# Patient Record
Sex: Female | Born: 1945
Health system: Southern US, Community
[De-identification: ages and names within clinical notes are randomized; demographics above are authoritative.]

## PROBLEM LIST (undated history)

## (undated) DIAGNOSIS — Z9889 Other specified postprocedural states: Secondary | ICD-10-CM

## (undated) DIAGNOSIS — E559 Vitamin D deficiency, unspecified: Secondary | ICD-10-CM

## (undated) DIAGNOSIS — G479 Sleep disorder, unspecified: Secondary | ICD-10-CM

## (undated) DIAGNOSIS — E039 Hypothyroidism, unspecified: Secondary | ICD-10-CM

## (undated) DIAGNOSIS — R112 Nausea with vomiting, unspecified: Secondary | ICD-10-CM

## (undated) DIAGNOSIS — K219 Gastro-esophageal reflux disease without esophagitis: Secondary | ICD-10-CM

## (undated) DIAGNOSIS — M797 Fibromyalgia: Secondary | ICD-10-CM

## (undated) DIAGNOSIS — M940 Chondrocostal junction syndrome [Tietze]: Secondary | ICD-10-CM

## (undated) DIAGNOSIS — G9001 Carotid sinus syncope: Secondary | ICD-10-CM

## (undated) DIAGNOSIS — H9190 Unspecified hearing loss, unspecified ear: Secondary | ICD-10-CM

## (undated) DIAGNOSIS — M1990 Unspecified osteoarthritis, unspecified site: Secondary | ICD-10-CM

## (undated) DIAGNOSIS — R351 Nocturia: Secondary | ICD-10-CM

## (undated) DIAGNOSIS — F411 Generalized anxiety disorder: Secondary | ICD-10-CM

## (undated) DIAGNOSIS — F419 Anxiety disorder, unspecified: Secondary | ICD-10-CM

## (undated) DIAGNOSIS — E7800 Pure hypercholesterolemia, unspecified: Secondary | ICD-10-CM

## (undated) DIAGNOSIS — M8580 Other specified disorders of bone density and structure, unspecified site: Secondary | ICD-10-CM

## (undated) DIAGNOSIS — M5430 Sciatica, unspecified side: Secondary | ICD-10-CM

## (undated) HISTORY — DX: Unspecified hearing loss, unspecified ear: H91.90

## (undated) HISTORY — DX: Fibromyalgia: M79.7

## (undated) HISTORY — DX: Sleep disorder, unspecified: G47.9

## (undated) HISTORY — DX: Nocturia: R35.1

## (undated) HISTORY — DX: Generalized anxiety disorder: F41.1

## (undated) HISTORY — DX: Unspecified osteoarthritis, unspecified site: M19.90

## (undated) HISTORY — DX: Vitamin D deficiency, unspecified: E55.9

## (undated) HISTORY — DX: Sciatica, unspecified side: M54.30

## (undated) HISTORY — PX: TUBAL LIGATION: SHX77

## (undated) HISTORY — DX: Carotid sinus syncope: G90.01

## (undated) HISTORY — PX: CHOLECYSTECTOMY: SHX55

## (undated) HISTORY — PX: KNEE SURGERY: SHX244

## (undated) HISTORY — DX: Other specified disorders of bone density and structure, unspecified site: M85.80

## (undated) HISTORY — DX: Hypothyroidism, unspecified: E03.9

## (undated) HISTORY — DX: Pure hypercholesterolemia, unspecified: E78.00

---

## 1998-11-02 ENCOUNTER — Encounter: Payer: Self-pay | Admitting: Rheumatology

## 1998-11-02 ENCOUNTER — Ambulatory Visit (HOSPITAL_COMMUNITY): Admission: RE | Admit: 1998-11-02 | Discharge: 1998-11-02 | Payer: Self-pay | Admitting: Rheumatology

## 1999-04-22 ENCOUNTER — Encounter: Payer: Self-pay | Admitting: Obstetrics and Gynecology

## 1999-04-22 ENCOUNTER — Encounter: Admission: RE | Admit: 1999-04-22 | Discharge: 1999-04-22 | Payer: Self-pay | Admitting: Obstetrics and Gynecology

## 2000-01-18 ENCOUNTER — Encounter: Payer: Self-pay | Admitting: Orthopedic Surgery

## 2000-01-18 ENCOUNTER — Ambulatory Visit (HOSPITAL_COMMUNITY): Admission: RE | Admit: 2000-01-18 | Discharge: 2000-01-18 | Payer: Self-pay | Admitting: Orthopedic Surgery

## 2000-09-20 ENCOUNTER — Encounter: Payer: Self-pay | Admitting: Obstetrics and Gynecology

## 2000-09-20 ENCOUNTER — Encounter: Admission: RE | Admit: 2000-09-20 | Discharge: 2000-09-20 | Payer: Self-pay | Admitting: Obstetrics and Gynecology

## 2001-06-29 ENCOUNTER — Other Ambulatory Visit: Admission: RE | Admit: 2001-06-29 | Discharge: 2001-06-29 | Payer: Self-pay | Admitting: Obstetrics and Gynecology

## 2002-02-26 ENCOUNTER — Ambulatory Visit (HOSPITAL_COMMUNITY): Admission: RE | Admit: 2002-02-26 | Discharge: 2002-02-26 | Payer: Self-pay | Admitting: Gastroenterology

## 2002-03-11 ENCOUNTER — Encounter: Admission: RE | Admit: 2002-03-11 | Discharge: 2002-03-11 | Payer: Self-pay | Admitting: Family Medicine

## 2002-03-11 ENCOUNTER — Encounter: Payer: Self-pay | Admitting: Family Medicine

## 2002-07-06 ENCOUNTER — Emergency Department (HOSPITAL_COMMUNITY): Admission: EM | Admit: 2002-07-06 | Discharge: 2002-07-06 | Payer: Self-pay | Admitting: Emergency Medicine

## 2002-12-06 ENCOUNTER — Other Ambulatory Visit: Admission: RE | Admit: 2002-12-06 | Discharge: 2002-12-06 | Payer: Self-pay | Admitting: Obstetrics and Gynecology

## 2003-02-22 HISTORY — PX: CHOLECYSTECTOMY: SHX55

## 2003-12-24 ENCOUNTER — Other Ambulatory Visit: Admission: RE | Admit: 2003-12-24 | Discharge: 2003-12-24 | Payer: Self-pay | Admitting: Obstetrics and Gynecology

## 2004-03-10 ENCOUNTER — Encounter: Admission: RE | Admit: 2004-03-10 | Discharge: 2004-03-10 | Payer: Self-pay | Admitting: Obstetrics and Gynecology

## 2004-05-31 ENCOUNTER — Ambulatory Visit (HOSPITAL_COMMUNITY): Admission: RE | Admit: 2004-05-31 | Discharge: 2004-05-31 | Payer: Self-pay | Admitting: Gastroenterology

## 2004-05-31 ENCOUNTER — Encounter (INDEPENDENT_AMBULATORY_CARE_PROVIDER_SITE_OTHER): Payer: Self-pay | Admitting: *Deleted

## 2004-06-03 ENCOUNTER — Encounter: Admission: RE | Admit: 2004-06-03 | Discharge: 2004-06-03 | Payer: Self-pay | Admitting: Gastroenterology

## 2004-06-15 ENCOUNTER — Ambulatory Visit (HOSPITAL_COMMUNITY): Admission: RE | Admit: 2004-06-15 | Discharge: 2004-06-15 | Payer: Self-pay | Admitting: General Surgery

## 2004-09-23 ENCOUNTER — Encounter (INDEPENDENT_AMBULATORY_CARE_PROVIDER_SITE_OTHER): Payer: Self-pay | Admitting: Specialist

## 2004-09-23 ENCOUNTER — Ambulatory Visit (HOSPITAL_COMMUNITY): Admission: RE | Admit: 2004-09-23 | Discharge: 2004-09-24 | Payer: Self-pay | Admitting: General Surgery

## 2004-11-16 ENCOUNTER — Encounter: Admission: RE | Admit: 2004-11-16 | Discharge: 2004-11-16 | Payer: Self-pay | Admitting: Gastroenterology

## 2004-12-21 ENCOUNTER — Encounter: Admission: RE | Admit: 2004-12-21 | Discharge: 2004-12-21 | Payer: Self-pay | Admitting: Gastroenterology

## 2004-12-24 ENCOUNTER — Other Ambulatory Visit: Admission: RE | Admit: 2004-12-24 | Discharge: 2004-12-24 | Payer: Self-pay | Admitting: Obstetrics and Gynecology

## 2005-07-12 ENCOUNTER — Encounter: Admission: RE | Admit: 2005-07-12 | Discharge: 2005-07-12 | Payer: Self-pay | Admitting: Obstetrics and Gynecology

## 2005-12-26 ENCOUNTER — Other Ambulatory Visit: Admission: RE | Admit: 2005-12-26 | Discharge: 2005-12-26 | Payer: Self-pay | Admitting: Obstetrics and Gynecology

## 2006-02-21 HISTORY — PX: ESOPHAGUS SURGERY: SHX626

## 2006-09-25 ENCOUNTER — Ambulatory Visit (HOSPITAL_COMMUNITY): Admission: RE | Admit: 2006-09-25 | Discharge: 2006-09-25 | Payer: Self-pay | Admitting: Obstetrics and Gynecology

## 2007-02-27 ENCOUNTER — Other Ambulatory Visit: Admission: RE | Admit: 2007-02-27 | Discharge: 2007-02-27 | Payer: Self-pay | Admitting: Obstetrics and Gynecology

## 2007-10-24 ENCOUNTER — Ambulatory Visit (HOSPITAL_COMMUNITY): Admission: RE | Admit: 2007-10-24 | Discharge: 2007-10-24 | Payer: Self-pay | Admitting: Obstetrics and Gynecology

## 2008-04-30 ENCOUNTER — Other Ambulatory Visit: Admission: RE | Admit: 2008-04-30 | Discharge: 2008-04-30 | Payer: Self-pay | Admitting: Obstetrics and Gynecology

## 2008-04-30 ENCOUNTER — Encounter: Payer: Self-pay | Admitting: Obstetrics and Gynecology

## 2008-04-30 ENCOUNTER — Ambulatory Visit: Payer: Self-pay | Admitting: Obstetrics and Gynecology

## 2008-06-23 ENCOUNTER — Ambulatory Visit: Payer: Self-pay | Admitting: Obstetrics and Gynecology

## 2008-10-09 ENCOUNTER — Ambulatory Visit: Payer: Self-pay | Admitting: Obstetrics and Gynecology

## 2008-12-31 ENCOUNTER — Ambulatory Visit (HOSPITAL_COMMUNITY): Admission: RE | Admit: 2008-12-31 | Discharge: 2008-12-31 | Payer: Self-pay | Admitting: Obstetrics and Gynecology

## 2009-05-05 ENCOUNTER — Ambulatory Visit: Payer: Self-pay | Admitting: Obstetrics and Gynecology

## 2009-05-05 ENCOUNTER — Other Ambulatory Visit: Admission: RE | Admit: 2009-05-05 | Discharge: 2009-05-05 | Payer: Self-pay | Admitting: Obstetrics and Gynecology

## 2010-02-02 ENCOUNTER — Ambulatory Visit: Payer: Self-pay | Admitting: Family Medicine

## 2010-02-08 ENCOUNTER — Ambulatory Visit: Payer: Self-pay | Admitting: Gynecology

## 2010-02-17 ENCOUNTER — Ambulatory Visit
Admission: RE | Admit: 2010-02-17 | Discharge: 2010-02-17 | Payer: Self-pay | Source: Home / Self Care | Attending: Gynecology | Admitting: Gynecology

## 2010-03-02 ENCOUNTER — Emergency Department (HOSPITAL_COMMUNITY)
Admission: EM | Admit: 2010-03-02 | Discharge: 2010-03-03 | Payer: Self-pay | Source: Home / Self Care | Admitting: Emergency Medicine

## 2010-03-03 ENCOUNTER — Ambulatory Visit
Admission: RE | Admit: 2010-03-03 | Discharge: 2010-03-03 | Payer: Self-pay | Source: Home / Self Care | Attending: Obstetrics and Gynecology | Admitting: Obstetrics and Gynecology

## 2010-03-08 LAB — URINALYSIS, ROUTINE W REFLEX MICROSCOPIC
Bilirubin Urine: NEGATIVE
Hgb urine dipstick: NEGATIVE
Ketones, ur: 15 mg/dL — AB
Nitrite: NEGATIVE
Protein, ur: NEGATIVE mg/dL
Specific Gravity, Urine: 1.013 (ref 1.005–1.030)
Urine Glucose, Fasting: NEGATIVE mg/dL
Urobilinogen, UA: 0.2 mg/dL (ref 0.0–1.0)
pH: 7.5 (ref 5.0–8.0)

## 2010-03-08 LAB — COMPREHENSIVE METABOLIC PANEL
ALT: 17 U/L (ref 0–35)
AST: 26 U/L (ref 0–37)
Albumin: 4.1 g/dL (ref 3.5–5.2)
Alkaline Phosphatase: 75 U/L (ref 39–117)
BUN: 17 mg/dL (ref 6–23)
CO2: 23 mEq/L (ref 19–32)
Calcium: 9.7 mg/dL (ref 8.4–10.5)
Chloride: 106 mEq/L (ref 96–112)
Creatinine, Ser: 0.82 mg/dL (ref 0.4–1.2)
GFR calc Af Amer: >60 mL/min (ref >60)
GFR calc non Af Amer: >60 mL/min (ref >60)
Glucose, Bld: 152 mg/dL — ABNORMAL HIGH (ref 70–99)
Potassium: 3.7 mEq/L (ref 3.5–5.1)
Sodium: 140 mEq/L (ref 135–145)
Total Bilirubin: 0.7 mg/dL (ref 0.3–1.2)
Total Protein: 6.9 g/dL (ref 6.0–8.3)

## 2010-03-08 LAB — CBC
HCT: 38.8 % (ref 36.0–46.0)
Hemoglobin: 13.3 g/dL (ref 12.0–15.0)
MCH: 32.1 pg (ref 26.0–34.0)
MCHC: 34.3 g/dL (ref 30.0–36.0)
MCV: 93.7 fL (ref 78.0–100.0)
RBC: 4.14 MIL/uL (ref 3.87–5.11)
RDW: 13.9 % (ref 11.5–15.5)
WBC: 9.3 10*3/uL (ref 4.0–10.5)

## 2010-03-08 LAB — LIPASE, BLOOD: Lipase: 25 U/L (ref 11–59)

## 2010-03-08 LAB — URINE MICROSCOPIC-ADD ON

## 2010-03-08 LAB — DIFFERENTIAL
Basophils Absolute: 0 10*3/uL (ref 0.0–0.1)
Basophils Relative: 0 % (ref 0–1)
Eosinophils Absolute: 0.1 10*3/uL (ref 0.0–0.7)
Eosinophils Relative: 1 % (ref 0–5)
Lymphocytes Relative: 14 % (ref 12–46)
Lymphs Abs: 1.3 10*3/uL (ref 0.7–4.0)
Monocytes Absolute: 0.7 10*3/uL (ref 0.1–1.0)
Monocytes Relative: 7 % (ref 3–12)
Neutro Abs: 7.2 10*3/uL (ref 1.7–7.7)
Neutrophils Relative %: 78 % — ABNORMAL HIGH (ref 43–77)

## 2010-03-13 ENCOUNTER — Encounter: Payer: Self-pay | Admitting: Obstetrics and Gynecology

## 2010-03-14 ENCOUNTER — Encounter: Payer: Self-pay | Admitting: Obstetrics and Gynecology

## 2010-03-15 ENCOUNTER — Encounter: Payer: Self-pay | Admitting: Obstetrics and Gynecology

## 2010-03-30 ENCOUNTER — Other Ambulatory Visit: Payer: Self-pay | Admitting: Obstetrics and Gynecology

## 2010-03-30 DIAGNOSIS — Z1231 Encounter for screening mammogram for malignant neoplasm of breast: Secondary | ICD-10-CM

## 2010-04-13 ENCOUNTER — Ambulatory Visit: Payer: Self-pay

## 2010-04-21 ENCOUNTER — Ambulatory Visit: Payer: Self-pay

## 2010-04-29 ENCOUNTER — Ambulatory Visit
Admission: RE | Admit: 2010-04-29 | Discharge: 2010-04-29 | Disposition: A | Discharge status: Home / Self Care | Payer: BC Managed Care – PPO | Source: Ambulatory Visit | Attending: Obstetrics and Gynecology | Admitting: Obstetrics and Gynecology

## 2010-04-29 DIAGNOSIS — Z1231 Encounter for screening mammogram for malignant neoplasm of breast: Secondary | ICD-10-CM

## 2010-05-17 ENCOUNTER — Encounter: Payer: Self-pay | Admitting: Obstetrics and Gynecology

## 2010-05-21 ENCOUNTER — Other Ambulatory Visit (HOSPITAL_COMMUNITY)
Admission: RE | Admit: 2010-05-21 | Discharge: 2010-05-21 | Disposition: A | Discharge status: Home / Self Care | Payer: BC Managed Care – PPO | Source: Ambulatory Visit | Attending: Obstetrics and Gynecology | Admitting: Obstetrics and Gynecology

## 2010-05-21 ENCOUNTER — Other Ambulatory Visit: Payer: Self-pay | Admitting: Obstetrics and Gynecology

## 2010-05-21 ENCOUNTER — Encounter (INDEPENDENT_AMBULATORY_CARE_PROVIDER_SITE_OTHER): Payer: BC Managed Care – PPO | Admitting: Obstetrics and Gynecology

## 2010-05-21 DIAGNOSIS — Z833 Family history of diabetes mellitus: Secondary | ICD-10-CM

## 2010-05-21 DIAGNOSIS — Z124 Encounter for screening for malignant neoplasm of cervix: Secondary | ICD-10-CM | POA: Insufficient documentation

## 2010-05-21 DIAGNOSIS — Z01419 Encounter for gynecological examination (general) (routine) without abnormal findings: Secondary | ICD-10-CM

## 2010-05-21 DIAGNOSIS — R635 Abnormal weight gain: Secondary | ICD-10-CM

## 2010-05-21 DIAGNOSIS — Z13220 Encounter for screening for lipoid disorders: Secondary | ICD-10-CM

## 2010-05-21 DIAGNOSIS — R82998 Other abnormal findings in urine: Secondary | ICD-10-CM

## 2010-07-09 NOTE — Op Note (Signed)
   Pam [name], Pam [name]                           ACCOUNT NO.:  [id number]   MEDICAL RECORD NO.:  [id number]                   PATIENT TYPE:  AMB   LOCATION:  ENDO                                 FACILITY:  [facility]   PHYSICIAN:  Anselmo Rod, M.D.               DATE OF BIRTH:  Jan 02, 1946   DATE OF PROCEDURE:  02/26/2002  DATE OF DISCHARGE:                                 OPERATIVE REPORT   PROCEDURE:  Colonoscopy.   ENDOSCOPIST:  Anselmo Rod, M.D.   INSTRUMENT USED:  Pediatric adjustable Olympus colonoscope.   INDICATIONS FOR PROCEDURE:  A 65 year old [place] female undergoing  screening colonoscopy to rule out chronic polyps, masses, etc.  The patient  has a history of diarrhea alternating with constipation.   PROCEDURE PREPARATION:  Informed consent was procured from the patient. The  patient was fasted with a bottle of Gatorade and MiraLax the night prior to  the procedure.   PREPROCEDURE PHYSICAL:  VITAL SIGNS:  Stable.  NECK: Supple. CHEST: Clear to  auscultation. HEART: S1 and S2 regular. ABDOMEN: Soft with normal bowel  sounds.   DESCRIPTION OF PROCEDURE:  The patient was placed in the left lateral  decubitus position and sedated with 60 mg of Demerol and 7 mg of Versed  intravenously. Once the patient was adequately sedated and maintained on low  flow oxygen and continuous cardiac monitoring, the Olympus video colonoscope  was advanced from the rectum to the cecum and terminal ileum. There was some  residual stool in the colon. Multiple washings were done. Very small lesions  could have been missed, however, no large masses, polyps, erosions,  ulcerations, or diverticula were seen. The patient tolerated the procedure  well without complications.   IMPRESSION:  1. Essentially normal colonoscopy up to the terminal ileum.  2. Some residual stool and very small lesions could have been missed.   RECOMMENDATIONS:  A high fiber diet is recommended for the patient  and  patient follow-up is advised in the next two weeks or earlier if needed.  Liberal fluid supplementation along with fiber has been advised.  Further  recommendations made as needed.                                               Anselmo Rod, M.D.    JNM/MEDQ  D:  02/27/2002  T:  02/27/2002  Job:  [id number]

## 2010-07-09 NOTE — Op Note (Signed)
[name], [name]               ACCOUNT NO.:  [id number]   MEDICAL RECORD NO.:  [id number]          PATIENT TYPE:  AMB   LOCATION:  ENDO                         FACILITY:  [facility]   PHYSICIAN:  Anselmo Rod, M.D.  DATE OF BIRTH:  12-Oct-1945   DATE OF PROCEDURE:  05/31/2004  DATE OF DISCHARGE:                                 OPERATIVE REPORT   PROCEDURE PERFORMED:  Esophagogastroduodenoscopy with biopsies.   ENDOSCOPIST:  Anselmo Rod, M.D.   INSTRUMENT USED:  Olympus video panendoscope.   INDICATIONS FOR PROCEDURE:  A 65 year old [place] female with history of  severe epigastric pain and retrosternal burning and hoarsenees inspite of  the use of PPI's rule out peptic ulcer disease, esophagitis, gastritis, etc.   PREPROCEDURE PREPARATION:  Informed consent was procured from the patient.  The patient fasted for eight hours prior to the procedure.   PREPROCEDURE PHYSICAL EXAMINATION:  VITAL SIGNS:  Stable.  NECK:  Supple.  CHEST:  Clear to auscultation.  CARDIOVASCULAR:  S1 and S2 regular.  ABDOMEN:  Soft with normal bowel sounds.   DESCRIPTION OF PROCEDURE:  The patient was placed in left lateral decubitus  position, sedated with 60 mg of Demerol and 8  mg of Versed in slow  incremental doses.  Once the patient was adequately sedated and maintained  on low flow oxygen and continuous cardiac monitoring, the Olympus video  panendoscope was advanced through the mouthpiece over the tongue and into  the esophagus under direct vision.  The entire esophagus appeared normal and  widely patent with no evidence of ring, stricture, masses, esophagitis or  Barrett's mucosa.  The scope was then advanced in the stomach.  A small  hiatal hernia was seen on high retroflexion.  Antral gastritis was noted.  Biopsies were done to rule out presence of H. pylori by pathology.  The  proximal small-bowel appeared normal.  No ulcers, erosions, masses or polyps  were identified.  The patient  tolerated the procedure well without  complications.   IMPRESSION:  1.  Normal esophagus and proximal small-bowel.  2.  Small hiatal hernia.  3.  Antral gastritis, biopsies done to rule out Helicobacter pylori.   RECOMMENDATIONS:  1.  Proceed with abdominal ultrasound and HIDA scan with CCK injection.  2.  A 24-hour repeat study will be done if the above mentioned tests are      unrevealing.  3.  Continue PPIs for now and avoid nonsteroidals.  4.  Outpatient follow-up in the next two to three weeks, earlier if need be.      JNM/MEDQ  D:  05/31/2004  T:  05/31/2004  Job:  [id number]   cc:   Pablo Ledger, MD  [facility], [place]

## 2010-07-09 NOTE — Op Note (Signed)
NAMEALECE, KOPPEL NO.:  [id number]   MEDICAL RECORD NO.:  [id number]          PATIENT TYPE:  OIB   LOCATION:  2899                         FACILITY:  [facility]   PHYSICIAN:  Sharlet Salina T. Hoxworth, M.D.DATE OF BIRTH:  30-Oct-1945   DATE OF PROCEDURE:  09/23/2004  DATE OF DISCHARGE:                                 OPERATIVE REPORT   PREOPERATIVE DIAGNOSIS:  Biliary dyskinesia.   POSTOPERATIVE DIAGNOSIS:  Biliary dyskinesia.   SURGICAL PROCEDURE:  Laparoscopic cholecystectomy with intraoperative  cholangiogram.   SURGEON:  Sharlet Salina T. Hoxworth, M.D.   ASSISTANT:  Consuello Bossier, M.D.   BRIEF HISTORY:  Ms. Coventry is a 65 year old female with a long history of  gradually worsening episodes of upper abdominal pain, nausea and vomiting.  She has had a thorough workup including a negative gallbladder ultrasound,  upper endoscopy and a HIDA scan showing emptying of the gallbladder at lower  limits of normal.  Her symptoms have been progressive and worsening with  upper abdominal and right upper quadrant tenderness, and clinically she is  felt to likely have biliary dyskinesia.  Due to unrelenting symptoms, we  have elected to proceed with laparoscopic cholecystectomy with cholangiogram  in an effort to relieve her symptoms.  The nature of the procedure,  indications and risks of bleeding, infection, bile leak, bile duct injury  and possible failure to relieve her symptoms were discussed and understood.  She is now brought to the operating room for this procedure.   DESCRIPTION OF OPERATION:  The patient was brought to the operating room and  placed in supine position on the operating table and general endotracheal  anesthesia was induced.  The abdomen was sterilely prepped and draped.  Correct patient and procedure were verified.  Local anesthesia was used to  infiltrate the trocar sites prior to the incisions.  The Hasson trocar was  used through a 1-cm umbilical  incision and a mattress suture of 0 Vicryl and  pneumoperitoneum established.  Standard 4-port technique was used.  The  gallbladder was mildly distended and did not appear inflamed, although there  were some omental adhesions around the infundibulum.  These were taken down  with careful blunt dissection and then retracted inferolaterally.  Fibrofatty tissue was stripped off the neck of the gallbladder and  peritoneum anterior and posterior to Calot's triangle was incised and  Calot's triangle was thoroughly dissected.  The cystic artery was identified  coursing up on the gallbladder wall and the cystic duct-gallbladder junction  was identified and dissected 360 degrees and the cystic duct dissected out  over about a centimeter.  When the anatomy was clear, the cystic artery was  singly clipped, the cystic duct was clipped at the gallbladder junction and  operative cholangiogram obtained through the cystic duct.  This showed good  filling of normal common bile duct and intrahepatic ducts with free flow  into the duodenum and no filling defects.  Following this, the cholangiocath  was removed and the cyst duct was doubly clipped proximally and divided, and  the cystic artery was further clipped and divided.  The  gallbladder was then  dissected free from its bed using hook cautery and removed through the  umbilicus.  Complete hemostasis was assured.  Trocars were then removed  under direct vision  and all CO2 evacuated.  The mattress suture was secured at the umbilicus.  Skin incisions were closed with interrupted subcuticular 4-0 Monocryl and  Steri-Strips.  Sponge, needle and instrument counts were correct.  The  patient was taken to Recovery in good condition.       BTH/MEDQ  D:  09/23/2004  T:  09/23/2004  Job:  [id number]

## 2010-11-14 ENCOUNTER — Other Ambulatory Visit: Payer: Self-pay | Admitting: Obstetrics and Gynecology

## 2010-11-16 ENCOUNTER — Other Ambulatory Visit: Payer: Self-pay | Admitting: *Deleted

## 2010-11-16 MED ORDER — ZOLPIDEM TARTRATE 10 MG PO TABS: 10.0000 mg | ORAL_TABLET | Freq: Every evening | ORAL | Status: DC | PRN

## 2010-11-16 NOTE — Telephone Encounter (Signed)
Rx was faxed back to pharmacy and not given to patient.(this said since computer said print)

## 2010-11-16 NOTE — Telephone Encounter (Signed)
I thought we had already done this one?

## 2010-11-16 NOTE — Telephone Encounter (Signed)
We did Ambein

## 2010-11-16 NOTE — Telephone Encounter (Signed)
RF request annual exam was in March 2012

## 2010-11-16 NOTE — Telephone Encounter (Signed)
ANNUAL EXAM WAS IN MARCH 2012 LAST REFILL DATE 07 23 12.

## 2011-03-24 ENCOUNTER — Other Ambulatory Visit: Payer: Self-pay | Admitting: Obstetrics and Gynecology

## 2011-03-24 DIAGNOSIS — Z1231 Encounter for screening mammogram for malignant neoplasm of breast: Secondary | ICD-10-CM

## 2011-03-30 ENCOUNTER — Other Ambulatory Visit: Payer: Self-pay | Admitting: Obstetrics and Gynecology

## 2011-03-31 NOTE — Telephone Encounter (Signed)
rx called in

## 2011-04-01 ENCOUNTER — Ambulatory Visit: Payer: BC Managed Care – PPO | Admitting: Women's Health

## 2011-05-02 ENCOUNTER — Ambulatory Visit (HOSPITAL_COMMUNITY): Payer: BC Managed Care – PPO

## 2011-05-16 ENCOUNTER — Other Ambulatory Visit: Payer: Self-pay | Admitting: Obstetrics and Gynecology

## 2011-05-16 NOTE — Telephone Encounter (Signed)
Last CE 05/21/10.  Hardcopy chart to be in your office for review.

## 2011-05-16 NOTE — Telephone Encounter (Signed)
Rx phoned in.   

## 2011-05-30 ENCOUNTER — Ambulatory Visit (HOSPITAL_COMMUNITY): Payer: BC Managed Care – PPO | Attending: Obstetrics and Gynecology

## 2011-06-17 ENCOUNTER — Encounter: Payer: BC Managed Care – PPO | Admitting: Obstetrics and Gynecology

## 2011-07-06 ENCOUNTER — Other Ambulatory Visit (HOSPITAL_COMMUNITY)
Admission: RE | Admit: 2011-07-06 | Discharge: 2011-07-06 | Disposition: A | Discharge status: Home / Self Care | Payer: Medicare Other | Source: Ambulatory Visit | Attending: Obstetrics and Gynecology | Admitting: Obstetrics and Gynecology

## 2011-07-06 ENCOUNTER — Encounter: Payer: Self-pay | Admitting: Obstetrics and Gynecology

## 2011-07-06 ENCOUNTER — Ambulatory Visit (INDEPENDENT_AMBULATORY_CARE_PROVIDER_SITE_OTHER): Payer: Medicare Other | Admitting: Obstetrics and Gynecology

## 2011-07-06 VITALS — BP 130/74 | Ht 60.0 in | Wt 147.0 lb

## 2011-07-06 DIAGNOSIS — R102 Pelvic and perineal pain: Secondary | ICD-10-CM

## 2011-07-06 DIAGNOSIS — E7800 Pure hypercholesterolemia, unspecified: Secondary | ICD-10-CM

## 2011-07-06 DIAGNOSIS — R350 Frequency of micturition: Secondary | ICD-10-CM

## 2011-07-06 DIAGNOSIS — N949 Unspecified condition associated with female genital organs and menstrual cycle: Secondary | ICD-10-CM

## 2011-07-06 DIAGNOSIS — M899 Disorder of bone, unspecified: Secondary | ICD-10-CM

## 2011-07-06 DIAGNOSIS — N952 Postmenopausal atrophic vaginitis: Secondary | ICD-10-CM

## 2011-07-06 DIAGNOSIS — K219 Gastro-esophageal reflux disease without esophagitis: Secondary | ICD-10-CM | POA: Insufficient documentation

## 2011-07-06 DIAGNOSIS — M8580 Other specified disorders of bone density and structure, unspecified site: Secondary | ICD-10-CM

## 2011-07-06 DIAGNOSIS — Z01419 Encounter for gynecological examination (general) (routine) without abnormal findings: Secondary | ICD-10-CM | POA: Insufficient documentation

## 2011-07-06 DIAGNOSIS — Z124 Encounter for screening for malignant neoplasm of cervix: Secondary | ICD-10-CM

## 2011-07-06 LAB — LIPID PANEL
Cholesterol: 236 mg/dL — ABNORMAL HIGH (ref 0–200)
HDL: 66 mg/dL (ref >39)
LDL Cholesterol: 149 mg/dL — ABNORMAL HIGH (ref 0–99)
Triglycerides: 105 mg/dL (ref <150)
VLDL: 21 mg/dL (ref 0–40)

## 2011-07-06 NOTE — Patient Instructions (Signed)
Scheduled bone density an ultrasound.

## 2011-07-06 NOTE — Progress Notes (Signed)
Patient came back to see me today for both further followup and new problem. Her new problem is right lower quadrant pain of 2 month's duration. It is sharp rather than dull. It occurs most days. It is not associated with nausea, vomiting, change in bowel habits. It is not associated with dysuria, frequency, urgency or hematuria. When patient points to the spot where she is having pain is in her right lower quadrant where the ovary is. There is no activity which makes the pain better or worse. Possibly related to this is that her daughter was recently diagnosed with breast cancer at age 38. The patient continues to suffer from vaginal dryness. She would like to get her vaginal estrogen from [place] rather than the compounded cream she was getting elsewhere. It does cause dyspareunia although she has not been sexually active for several months due to her husband's illness which was diverticulitis requiring surgery. The patient's cholesterol was elevated last year on her exam with an LDL of 141 and a total cholesterol of 227. Her other levels were normal. She has been exercising and dieting. She fasted today. The patient has osteopenia on bone density without an elevated FRAX risk. She is due for followup bone density. She takes calcium and vitamin D. She has not had any fractures.  ROS: 12 system review done. Pertinent positives above. Only other positive is gerd.  Physical examination:Kim Julian Reil present. HEENT within normal limits. Neck: Thyroid not large. No masses. Supraclavicular nodes: not enlarged. Breasts: Examined in both sitting and lying  position. No skin changes and no masses. Abdomen: Soft no guarding rebound or masses or hernia. Pelvic: External: Within normal limits. BUS: Within normal limits. Vaginal:within normal limits. Poor estrogen effect. No evidence of cystocele rectocele or enterocele. Cervix: clean. Uterus: Normal size and shape. Adnexa: No masses. Rectovaginal exam: Confirmatory  and negative. Extremities: Within normal limits.  Assessment: #1. Right lower quadrant pain #2. Atrophic vaginitis #3. Osteopenia #4. Elevated cholesterol  Plan: Pelvic ultrasound and  bone density scheduled. Lipid profile done. Patient to  schedule mammogram. Gave  Patient name of vaginal estrogen products and she will find out what is most cost-effective.

## 2011-07-07 LAB — URINALYSIS W MICROSCOPIC + REFLEX CULTURE
Glucose, UA: NEGATIVE mg/dL
Hgb urine dipstick: NEGATIVE
Protein, ur: NEGATIVE mg/dL
Urobilinogen, UA: 0.2 mg/dL (ref 0.0–1.0)

## 2011-07-13 ENCOUNTER — Ambulatory Visit: Payer: Medicare Other | Admitting: Obstetrics and Gynecology

## 2011-07-13 ENCOUNTER — Other Ambulatory Visit: Payer: Medicare Other

## 2011-07-14 ENCOUNTER — Other Ambulatory Visit: Payer: Self-pay | Admitting: *Deleted

## 2011-07-14 ENCOUNTER — Telehealth: Payer: Self-pay | Admitting: *Deleted

## 2011-07-14 DIAGNOSIS — N390 Urinary tract infection, site not specified: Secondary | ICD-10-CM

## 2011-07-14 MED ORDER — ESTRADIOL 0.1 MG/GM VA CREA: 1.0000 g | TOPICAL_CREAM | Freq: Every day | VAGINAL | Status: DC

## 2011-07-14 MED ORDER — AMOXICILLIN 250 MG PO CAPS: 250.0000 mg | ORAL_CAPSULE | Freq: Three times a day (TID) | ORAL | Status: AC

## 2011-07-14 NOTE — Telephone Encounter (Signed)
Addended by: Richardson Chiquito on: 07/14/2011 11:03 AM   Modules accepted: Orders

## 2011-07-14 NOTE — Telephone Encounter (Signed)
Pt was advised needs a rx for UTI, was given macrobid but there is an allergy for macrobid. Pls advise alternative rx.

## 2011-07-14 NOTE — Telephone Encounter (Signed)
Amoxicillin  250 mg 3 times a day for 7 days.

## 2011-07-14 NOTE — Progress Notes (Signed)
Informed and will put recall in [name]

## 2011-07-19 ENCOUNTER — Other Ambulatory Visit: Payer: Self-pay | Admitting: Obstetrics and Gynecology

## 2011-07-19 NOTE — Telephone Encounter (Signed)
rx called in

## 2011-07-21 ENCOUNTER — Ambulatory Visit (INDEPENDENT_AMBULATORY_CARE_PROVIDER_SITE_OTHER): Payer: Medicare Other

## 2011-07-21 DIAGNOSIS — M8580 Other specified disorders of bone density and structure, unspecified site: Secondary | ICD-10-CM

## 2011-07-21 DIAGNOSIS — M899 Disorder of bone, unspecified: Secondary | ICD-10-CM

## 2011-07-28 ENCOUNTER — Encounter: Payer: Self-pay | Admitting: Obstetrics and Gynecology

## 2011-08-03 ENCOUNTER — Ambulatory Visit (HOSPITAL_COMMUNITY)
Admission: RE | Admit: 2011-08-03 | Discharge: 2011-08-03 | Disposition: A | Discharge status: Home / Self Care | Payer: Medicare Other | Source: Ambulatory Visit | Attending: Obstetrics and Gynecology | Admitting: Obstetrics and Gynecology

## 2011-08-03 DIAGNOSIS — Z1231 Encounter for screening mammogram for malignant neoplasm of breast: Secondary | ICD-10-CM | POA: Insufficient documentation

## 2011-09-05 ENCOUNTER — Ambulatory Visit (INDEPENDENT_AMBULATORY_CARE_PROVIDER_SITE_OTHER): Payer: Medicare Other | Admitting: Family Medicine

## 2011-09-05 ENCOUNTER — Ambulatory Visit: Payer: Medicare Other

## 2011-09-05 VITALS — BP 110/62 | HR 78 | Temp 98.4°F | Resp 17 | Ht 60.0 in | Wt 145.0 lb

## 2011-09-05 DIAGNOSIS — S61209A Unspecified open wound of unspecified finger without damage to nail, initial encounter: Secondary | ICD-10-CM

## 2011-09-05 DIAGNOSIS — S61219A Laceration without foreign body of unspecified finger without damage to nail, initial encounter: Secondary | ICD-10-CM

## 2011-09-05 DIAGNOSIS — Z23 Encounter for immunization: Secondary | ICD-10-CM

## 2011-09-05 DIAGNOSIS — M79609 Pain in unspecified limb: Secondary | ICD-10-CM

## 2011-09-05 NOTE — Progress Notes (Signed)
 [facility]:  Office Visit  Chief Complaint:  Chief Complaint  Patient presents with  . Hand Injury    laceration to finger     HPI: Pam [name] is a 66 y.o. female who complains of acute finger laceration after pulling drawer  nad had a serrated steak knif cut her. Knife was clean. Patient is not UTD on TDap. Would like Tdap. + numbness and swelling.   Past Medical History  Diagnosis Date  . Acid reflux    Past Surgical History  Procedure Date  . Tubal ligation   . Cholecystectomy   . Esophagus surgery 2008   History   Social History  . Marital Status: Married    Spouse Name: N/A    Number of Children: N/A  . Years of Education: N/A   Social History Main Topics  . Smoking status: Never Smoker   . Smokeless tobacco: None  . Alcohol Use: 1.5 oz/week    3 drink(s) per week     OCCASIONAL  . Drug Use: No  . Sexually Active: Yes    Birth Control/ Protection: Surgical, Post-menopausal   Other Topics Concern  . None   Social History Narrative  . None   Family History  Problem Relation Age of Onset  . Heart failure Brother    Allergies  Allergen Reactions  . Codeine Nausea And Vomiting  . Macrodantin     CAUSES GERD   Prior to Admission medications   Medication Sig Start Date End Date Taking? Authorizing Provider  ALPRAZolam Prudy Feeler) 0.25 MG tablet take 1 tablet by mouth three times a day if needed 07/19/11  Yes Trellis Paganini, MD  Calcium Carbonate-Vitamin D (OSCAL 500/200 D-3 PO) Take by mouth.   Yes Historical Provider, MD  Cyanocobalamin (VITAMIN B 12 PO) Take by mouth.   Yes Historical Provider, MD  Multiple Vitamins-Minerals (MULTIVITAMIN PO) Take by mouth.   Yes Historical Provider, MD  OVER THE COUNTER MEDICATION VITAMIN B 6 PO   Yes Historical Provider, MD  Vitamin D, Ergocalciferol, (DRISDOL) 50000 UNITS CAPS Take 50,000 Units by mouth.   Yes Historical Provider, MD  VITAMIN E PO Take by mouth.   Yes Historical Provider, MD    Cetirizine HCl (ZYRTEC PO) Take by mouth.    Historical Provider, MD  estradiol (ESTRACE VAGINAL) 0.1 MG/GM vaginal cream Place 0.25 Applicatorfuls vaginally daily. 07/14/11 07/13/12  Trellis Paganini, MD  FLUoxetine (PROZAC) 20 MG tablet Take 20 mg by mouth daily.    Historical Provider, MD  zolpidem (AMBIEN) 10 MG tablet Take 1 tablet (10 mg total) by mouth at bedtime as needed. 11/16/10   Trellis Paganini, MD     ROS: The patient denies fevers, chills, night sweats, unintentional weight loss, chest pain, palpitations, wheezing, dyspnea on exertion, nausea, vomiting, abdominal pain, dysuria, hematuria, melena, weakness, or tingling. + numbness  All other systems have been reviewed and were otherwise negative with the exception of those mentioned in the HPI and as above.    PHYSICAL EXAM: Filed Vitals:   09/05/11 1433  Pulse: 78  Temp: 98.4 F (36.9 C)  Resp: 17   Filed Vitals:   09/05/11 1433  Height: 5' (1.524 m)  Weight: 145 lb (65.772 kg)   Body mass index is 28.32 kg/(m^2).  General: Alert, no acute distress HEENT:  Normocephalic, atraumatic, oropharynx patent.  Cardiovascular:  Regular rate and rhythm, no rubs murmurs or gallops.  No Carotid bruits, radial pulse intact. No pedal  edema.  Respiratory: Clear to auscultation bilaterally.  No wheezes, rales, or rhonchi.  No cyanosis, no use of accessory musculature GI: No organomegaly, abdomen is soft and non-tender, positive bowel sounds.  No masses. Skin: No rashes. Neurologic: Facial musculature symmetric. Psychiatric: Patient is appropriate throughout our interaction. Lymphatic: No cervical lymphadenopathy Musculoskeletal: Gait intact. Right middle finger ( 3rd) : small 1/4 inch laceration on dorsal and volar aspect of middle finger. Swollen. Superficial and deep flexor tendon intact. Strength intact, ROM intact, minimal decrease sensation  Diffusely around lateral and medial aspects of finger due to swelling. Good  capillary refill and radial pulse.    LABS: Results for orders placed in visit on 07/06/11  URINALYSIS WITH CULTURE REFX      Component Value Range   Color, Urine YELLOW  YELLOW   APPearance CAR  CAR   Specific Gravity, Urine 1.019  1.005 - 1.030   pH 5.5  5.0 - 8.0   Glucose, UA NEG  NEG mg/dL   Bilirubin Urine NEG  NEG   Ketones, ur NEG  NEG mg/dL   Hgb urine dipstick NEG  NEG   Protein, ur NEG  NEG mg/dL   Urobilinogen, UA 0.2  0.0 - 1.0 mg/dL   Nitrite NEG  NEG   Leukocytes, UA LARGE (*) NEG   Squamous Epithelial / LPF MANY (*) RARE   Crystals NONE SEEN  NONE SEEN   Casts NONE SEEN  NONE SEEN   WBC, UA 21-50 (*) <3 WBC/hpf   RBC / HPF 0-2  <3 RBC/hpf   Bacteria, UA NONE SEEN  RARE  LIPID PANEL      Component Value Range   Cholesterol 236 (*) 0 - 200 mg/dL   Triglycerides 086  <578 mg/dL   HDL 66  >46 mg/dL   Total CHOL/HDL Ratio 3.6     VLDL 21  0 - 40 mg/dL   LDL Cholesterol 962 (*) 0 - 99 mg/dL  URINE CULTURE      Component Value Range   Colony Count 20,OOO COLONIES/ML     Organism ID, Bacteria ENTEROCOCCUS SPECIES       EKG/XRAY:   Primary read interpreted by [facility] Conley Rolls at [facility]. No fx/dislocation/foreign body   ASSESSMENT/PLAN: Encounter Diagnoses  Name Primary?  . Open wound of finger Yes  . Finger laceration    Continue with wound care as directed.  F/u as directed. Tdap given Patient decline additional painmedication    ,  PHUONG, DO 09/05/2011 2:51 PM

## 2011-09-05 NOTE — Patient Instructions (Addendum)

## 2011-09-05 NOTE — Progress Notes (Signed)
Procedure:  VCO  Wound injected locally with 1.5 cc Lidocaine 2% plain Sterile field and prep Wound explored. No deep structures damaged Closed with 5.0 Ethilon, # 2 HM Wound cleansed and dressed.

## 2011-09-30 ENCOUNTER — Other Ambulatory Visit: Payer: Self-pay | Admitting: Obstetrics and Gynecology

## 2011-10-03 NOTE — Telephone Encounter (Signed)
Called in to patient's pharmacy. 

## 2012-02-24 ENCOUNTER — Other Ambulatory Visit: Payer: Self-pay | Admitting: Obstetrics and Gynecology

## 2012-02-27 ENCOUNTER — Other Ambulatory Visit: Payer: Self-pay | Admitting: Gynecology

## 2012-02-27 NOTE — Telephone Encounter (Signed)
rx called in KW 

## 2012-02-27 NOTE — Telephone Encounter (Signed)
Annual exam 06/2011, last refill 12/15/11 Dr Eda Paschal patient

## 2012-02-27 NOTE — Telephone Encounter (Signed)
Pharmacy contacted that i had already called this in this morning. KW

## 2012-05-01 ENCOUNTER — Other Ambulatory Visit: Payer: Self-pay | Admitting: Gynecology

## 2012-05-03 NOTE — Telephone Encounter (Signed)
rx called in KW 

## 2012-07-06 ENCOUNTER — Ambulatory Visit (INDEPENDENT_AMBULATORY_CARE_PROVIDER_SITE_OTHER): Payer: Medicare Other | Admitting: Women's Health

## 2012-07-06 ENCOUNTER — Encounter: Payer: Self-pay | Admitting: Women's Health

## 2012-07-06 VITALS — BP 128/76 | Ht 60.0 in | Wt 144.0 lb

## 2012-07-06 DIAGNOSIS — M949 Disorder of cartilage, unspecified: Secondary | ICD-10-CM

## 2012-07-06 DIAGNOSIS — M899 Disorder of bone, unspecified: Secondary | ICD-10-CM

## 2012-07-06 DIAGNOSIS — G4700 Insomnia, unspecified: Secondary | ICD-10-CM

## 2012-07-06 DIAGNOSIS — N952 Postmenopausal atrophic vaginitis: Secondary | ICD-10-CM

## 2012-07-06 DIAGNOSIS — M8580 Other specified disorders of bone density and structure, unspecified site: Secondary | ICD-10-CM | POA: Insufficient documentation

## 2012-07-06 MED ORDER — ESTRADIOL 0.1 MG/GM VA CREA: 1.0000 g | TOPICAL_CREAM | Freq: Every day | VAGINAL | Status: AC

## 2012-07-06 MED ORDER — ALPRAZOLAM 0.25 MG PO TABS: 0.2500 mg | ORAL_TABLET | Freq: Every evening | ORAL | Status: DC | PRN

## 2012-07-06 NOTE — Patient Instructions (Addendum)
Vit D 2000 daily Health Recommendations for Postmenopausal Women Based on the Results of the Women's Health Initiative (WHI) and Other Studies The WHI is a major 15-year research program to address the most common causes of death, disability and poor quality of life in postmenopausal women. Some of these causes are heart disease, cancer, bone loss (osteoporosis) and others. Taking into account all of the findings from WHI and other studies, here are bottom-line health recommendations for women: CARDIOVASCULAR DISEASE Heart Disease: A heart attack is a medical emergency. Know the signs and symptoms of a heart attack. Hormone therapy should not be used to prevent heart disease. In women with heart disease, hormone therapy should not be used to prevent further disease. Hormone therapy increases the risk of blood clots. Below are things women can do to reduce their risk for heart disease.   Do not smoke. If you smoke, quit. Women who smoke are 2 to 6 times more likely to suffer a heart attack than non-smoking women.  Aim for a healthy weight. Being overweight causes many preventable deaths. Eat a healthy and balanced diet and drink an adequate amount of liquids.  Get moving. Make a commitment to be more physically active. Aim for 30 minutes of activity on most, if not all days of the week.  Eat for heart health. Choose a diet that is low in saturated fat, trans fat, and cholesterol. Include whole grains, vegetables, and fruits. Read the labels on the food container before buying it.  Know your numbers. Ask your caregiver to check your blood pressure, cholesterol (total, HDL, LDL, triglycerides) and blood glucose. Work with your caregiver to improve any numbers that are not normal.  High blood pressure. Limit or stop your table salt intake (try salt substitute and food seasonings), avoid salty foods and drinks. Read the labels on the food container before buying it. Avoid becoming overweight by eating  well and exercising. STROKE  Stroke is a medical emergency. Stroke can be the result of a blood clot in the blood vessel in the brain or by a brain hemorrhage (bleeding). Know the signs and symptoms of a stroke. To lower the risk of developing a stroke:  Avoid fatty foods.  Quit smoking.  Control your diabetes, blood pressure, and irregular heart rate. THROMBOPHLIBITIS (BLOOD CLOT) OF THE LEG  Hormone treatment is a big cause of developing blood clots in the leg. Becoming overweight and leading a stationary lifestyle also may contribute to developing blood clots. Controlling your diet and exercising will help lower the risk of developing blood clots. CANCER SCREENING  Breast Cancer: Women should take steps to reduce their risk of breast cancer. This includes having regular mammograms, monthly self breast exams and regular breast exams by your caregiver. Have a mammogram every one to two years if you are 40 to 67 years old. Have a mammogram annually if you are 50 years old or older depending on your risk factors. Women who are high risk for breast cancer may need more frequent mammograms. There are tests available (testing the genes in your body) if you have family history of breast cancer called BRCA 1 and 2. These tests can help determine the risks of developing breast cancer.  Intestinal or Stomach Cancer: Women should talk to their caregiver about when to start screening, what tests and how often they should be done, and the benefits and risks of doing these tests. Tests to consider are a rectal exam, fecal occult blood, sigmoidoscopy, colononoscoby, barium enema   and upper GI series of the stomach. Depending on the age, you may want to get a medical and family history of colon cancer. Women who are high risk may need to be screened at an earlier age and more often.  Cervical Cancer: A Pap test of the cervix should be done every year and every 3 years when there has been three straight years of a  normal Pap test. Women with an abnormal Pap test should be screened more often or have a cervical biopsy depending on your caregiver's recommendation.  Uterine Cancer: If you have vaginal bleeding after you are in the menopause, it should be evaluated by your caregiver.  Ovarian cancer: There are no reliable tests available to screen for ovarian cancer at this time except for yearly pelvic exams.  Lung Cancer: Yearly chest X-rays can detect lung cancer and should be done on high risk women, such as cigarette smokers and women with chronic lung disease (emphysemia).  Skin Cancer: A complete body skin exam should be done at your yearly examination. Avoid overexposure to the sun and ultraviolet light lamps. Use a strong sun block cream when in the sun. All of these things are important in lowering the risk of skin cancer. MENOPAUSE Menopause Symptoms: Hormone therapy products are effective for treating symptoms associated with menopause:  Moderate to severe hot flashes.  Night sweats.  Mood swings.  Headaches.  Tiredness.  Loss of sex drive.  Insomnia.  Other symptoms. However, hormone therapy products carry serious risks, especially in older women. Women who use or are thinking about using estrogen or estrogen with progestin treatments should discuss that with their caregiver. Your caregiver will know if the benefits outweigh the risks. The Food and Drug Administration (FDA) has concluded that hormone therapy should be used only at the lowest doses and for the shortest amount of time to reach treatment goals. It is not known at what doses there may be less risk of serious side effects. There are other treatments such as herbal medication (not controlled or regulated by the FDA), group therapy, counseling and acupuncture that may be helpful. OSTEOPOROSIS Protecting Against Bone Loss and Preventing Fracture: If hormone therapy is used for prevention of bone loss (osteoporosis), the risks for  bone loss must outweigh the risk of the therapy. Women considering taking hormone therapy for bone loss should ask their health care providers about other medications (fosamax and boniva) that are considered safe and effective for preventing bone loss and bone fractures. To guard against bone loss or fractures, it is recommended that women should take at least 1000-1500 mg of calcium and 400-800 IU of vitamin D daily in divided doses. Smoking and excessive alcohol intake increases the risk of osteoporosis. Eat foods rich in calcium and vitamin D and do weight bearing exercises several times a week as your caregiver suggests. DIABETES Diabetes Melitus: Women with Type I or Type 2 diabetes should keep their diabetes in control with diet, exercise and medication. Avoid too many sweets, starchy and fatty foods. Being overweight can affect your diabetes. COGNITION AND MEMORY Cognition and Memory: Menopausal hormone therapy is not recommended for the prevention of cognitive disorders such as Alzheimer's disease or memory loss. WHI found that women treated with hormone therapy have a greater risk of developing dementia.  DEPRESSION  Depression may occur at any age, but is common in elderly women. The reasons may be because of physical, medical, social (loneliness), financial and/or economic problems and needs. Becoming involved with church,   volunteer or social groups, seeking treatment for any physical or medical problems is recommended. Also, look into getting professional advice for any economic or financial problems. ACCIDENTS  Accidents are common and can be serious in the elderly woman. Prepare your house to prevent accidents. Eliminate throw rugs, use hip protectors, place hand bars in the bath, shower and toilet areas. Avoid wearing high heel shoes and walking on wet, snowy and icy areas. Stop driving if you have vision, hearing problems or are unsteady with you movements and reflexes. RHEUMATOID  ARTHRITIS Rheumatoid arthritis causes pain, swelling and stiffness of your bone joints. It can limit many of your activities. Over-the-counter medications may help, but prescription medications may be necessary. Talk with your caregiver about this. Exercise (walking, water aerobics), good posture, using splints on painful joints, warm baths or applying warm compresses to stiff joints and cold compresses to painful joints may be helpful. Smoking and excessive drinking may worsen the symptoms of arthritis. Seek help from a physical therapist if the arthritis is becoming a problem with your daily activities. IMMUNIZATIONS  Several immunizations are important to have during your senior years, including:   Tetanus and a diptheria shot booster every 10 years.  Influenza every year before the flu season begins.  Pneumonia vaccine.  Shingles vaccine.  Others as indicated (example: H1N1 vaccine). Document Released: 04/01/2005 Document Revised: 05/02/2011 Document Reviewed: 11/26/2007 ExitCare Patient Information 2013 [employer].  

## 2012-07-06 NOTE — Progress Notes (Signed)
Stepheny Canal Dec 03, 1945 [id number]    History:    The patient presents for breast and pelvic exam. Postmenopausal with no bleeding. Normal Pap and mammogram history. DEXA 2013 T score AP spine -2.2 hip average -0.9 FRAX 8.4%/0.8%. Estradiol vaginal cream 1-2 times weekly for vaginal dryness, rare intercourse due to husbands health. Negative colonoscopy 2009.   Past medical history, past surgical history, family history and social history were all reviewed and documented in the EPIC chart. 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 Anxiety/depression Prozac managed by primary care, uses occasional Xanax 0.25. 3 children, daughter breast cancer survivor at age 50 BRCA-negative. 5 grandchildren all doing well.  Exam:  Filed Vitals:   07/06/12 1051  BP: 128/76    General appearance:  Normal Head/Neck:  Normal, without cervical or supraclavicular adenopathy. Thyroid:  Symmetrical, normal in size, without palpable masses or nodularity. Respiratory  Effort:  Normal  Auscultation:  Clear without wheezing or rhonchi Cardiovascular  Auscultation:  Regular rate, without rubs, murmurs or gallops  Edema/varicosities:  Not grossly evident Abdominal  Soft,nontender, without masses, guarding or rebound.  Liver/spleen:  No organomegaly noted  Hernia:  None appreciated  Skin  Inspection:  Grossly normal  Palpation:  Grossly normal Neurologic/psychiatric  Orientation:  Normal with appropriate conversation.  Mood/affect:  Normal  Genitourinary    Breasts: Examined lying and sitting.     Right: Without masses, retractions, discharge or axillary adenopathy.     Left: Without masses, retractions, discharge or axillary adenopathy.   Inguinal/mons:  Normal without inguinal adenopathy  External genitalia:  Normal  BUS/Urethra/Skene's glands:  Normal  Bladder:  Normal  Vagina:  atrophic  Cervix:  Normal  Uterus:  normal in size, shape and contour.  Midline and mobile  Adnexa/parametria:      Rt: Without masses or tenderness.   Lt: Without masses or tenderness.  Anus and perineum: Normal  Digital rectal exam: Normal sphincter tone without palpated masses or tenderness  Assessment/Plan:  67 y.o.MWF G3P3  for breast and pelvic exam.  Vaginal atrophy Osteopenia  Plan: Pap normal 2013, new screening guidelines reviewed, estradiol vaginal cream 0.2% 1-2 times per week for vaginal dryness, reviewed slight systemic absorption and risk for blood clots and strokes. Vaginal lubricants with intercourse. SBE's, continue annual mammogram, calcium rich diet, vitamin D 2000 daily encouraged. Home safety and fall prevention discussed and importance of exercise for bone health. Home Hemoccult card given with instructions. Having intermittent left ankle pain with swelling, keep scheduled orthopedic followup appointment.      Harrington Challenger [facility], 11:37 AM 07/06/2012

## 2012-07-18 ENCOUNTER — Other Ambulatory Visit: Payer: Self-pay | Admitting: Gynecology

## 2012-07-18 DIAGNOSIS — Z1231 Encounter for screening mammogram for malignant neoplasm of breast: Secondary | ICD-10-CM

## 2012-08-03 ENCOUNTER — Ambulatory Visit (HOSPITAL_COMMUNITY): Admission: RE | Admit: 2012-08-03 | Payer: Medicare Other | Source: Ambulatory Visit

## 2012-08-03 ENCOUNTER — Telehealth: Payer: Self-pay | Admitting: *Deleted

## 2012-08-03 NOTE — Telephone Encounter (Signed)
Pt called stating that she has hurt her hand and cannot make her appt today.  Informed her that she did not have any appts here at [facility], however, she did have a mammogram appt at [facility].  She said yes that's where she needed to call.  She had the Radiology Dept direct number, but said she could not get anyone to answer the phone.  Gave her the main line and said that she could call them and they could direct her to to appropriate person to speak with to get rescheduled.

## 2012-08-09 ENCOUNTER — Ambulatory Visit (HOSPITAL_COMMUNITY): Payer: Medicare Other

## 2012-08-16 ENCOUNTER — Ambulatory Visit (HOSPITAL_COMMUNITY): Payer: Medicare Other

## 2012-08-18 ENCOUNTER — Other Ambulatory Visit: Payer: Self-pay | Admitting: Gynecology

## 2012-08-20 ENCOUNTER — Telehealth: Payer: Self-pay

## 2012-08-20 NOTE — Telephone Encounter (Deleted)
Former patient of Dr. Timoteo Expose who saw Yolanda Bonine. On 07/06/12 for CE.  Had been on Xanax prior and [place] wrote "Anxiety/depression Prozac managed by primary care, uses occasional Xanax 0.25."

## 2012-08-20 NOTE — Telephone Encounter (Signed)
[facility] sent fax request for refills for patient's Xanax Rx. Chart said you had prescribed it for her on May 16 for #30 and one refill.  So I contacted the pharmacy and he said the last time they filled it was 07/10/12 and that was from a prescription writtein on 05/03/12.  He did not have the one you prescribed at visit.  I spoke with patient and she said you did not give her printed Rx that day and when she checked with pharmacy they do not have it.  Ok to call it in?   (Pt knows [place] off until next week and she is fine with that. No hurry for Rx.)  [facility]  [phone]

## 2012-08-21 ENCOUNTER — Ambulatory Visit (HOSPITAL_COMMUNITY): Payer: Medicare Other

## 2012-08-27 NOTE — Telephone Encounter (Signed)
Please call patient and then e-scribe  chantix starter pak and 2 refill continuing paks

## 2012-08-27 NOTE — Telephone Encounter (Signed)
Thanks for picking up my mistake!!!! OK for xanax.  (Not chantix? Not sure what I was thinking)

## 2012-08-27 NOTE — Telephone Encounter (Signed)
Rx called in and patient informed ---

## 2012-08-27 NOTE — Telephone Encounter (Signed)
rx called into pharmacy

## 2012-08-27 NOTE — Telephone Encounter (Signed)
Is Chantix really what you want me to call in. Please see me note regarding her Xanax.  Her pharmacy sent refill request but it appeared she received Rx in May with a refill.  As you can see below, I spoke with patient and she said she never got those. You did not give her written Rx and was not at pharmacy. My question was OK to refill her Xanax?

## 2012-10-19 ENCOUNTER — Ambulatory Visit (HOSPITAL_COMMUNITY)
Admission: RE | Admit: 2012-10-19 | Discharge: 2012-10-19 | Disposition: A | Discharge status: Home / Self Care | Payer: Medicare Other | Source: Ambulatory Visit | Attending: Gynecology | Admitting: Gynecology

## 2012-10-19 DIAGNOSIS — Z1231 Encounter for screening mammogram for malignant neoplasm of breast: Secondary | ICD-10-CM | POA: Insufficient documentation

## 2012-10-30 ENCOUNTER — Other Ambulatory Visit: Payer: Self-pay | Admitting: *Deleted

## 2012-10-31 MED ORDER — ALPRAZOLAM 0.25 MG PO TABS: 0.2500 mg | ORAL_TABLET | Freq: Three times a day (TID) | ORAL | Status: DC | PRN

## 2012-10-31 NOTE — Telephone Encounter (Signed)
rx called in KW 

## 2012-11-15 ENCOUNTER — Encounter: Payer: Self-pay | Admitting: Women's Health

## 2012-11-15 ENCOUNTER — Ambulatory Visit (INDEPENDENT_AMBULATORY_CARE_PROVIDER_SITE_OTHER): Payer: Medicare Other | Admitting: Women's Health

## 2012-11-15 DIAGNOSIS — N898 Other specified noninflammatory disorders of vagina: Secondary | ICD-10-CM

## 2012-11-15 DIAGNOSIS — R350 Frequency of micturition: Secondary | ICD-10-CM

## 2012-11-15 LAB — URINALYSIS W MICROSCOPIC + REFLEX CULTURE
Bilirubin Urine: NEGATIVE
Ketones, ur: NEGATIVE mg/dL
Protein, ur: NEGATIVE mg/dL
Urobilinogen, UA: 0.2 mg/dL (ref 0.0–1.0)

## 2012-11-15 LAB — WET PREP FOR TRICH, YEAST, CLUE: Clue Cells Wet Prep HPF POC: NONE SEEN

## 2012-11-15 MED ORDER — ESTRADIOL 10 MCG VA TABS: 1.0000 | ORAL_TABLET | VAGINAL | Status: DC

## 2012-11-15 NOTE — Patient Instructions (Addendum)
Overactive Bladder, Adult  The bladder has two functions that are totally opposite of the other. One is to relax and stretch out so it can store urine (fills like a balloon), and the other is to contract and squeeze down so that it can empty the urine that it has stored. Proper functioning of the bladder is a complex mixing of these two functions. The filling and emptying of the bladder can be influenced by:  · The bladder.  · The spinal cord.  · The brain.  · The nerves going to the bladder.  · Other organs that are closely related to the bladder such as prostate in males and the vagina in females.  As your bladder fills with urine, nerve signals are sent from the bladder to the brain to tell you that you may need to urinate. Normal urination requires that the bladder squeeze down with sufficient strength to empty the bladder, but this also requires that the bladder squeeze down sufficiently long to finish the job. In addition the sphincter muscles, which normally keep you from leaking urine, must also relax so that the urine can pass. Coordination between the bladder muscle squeezing down and the sphincter muscles relaxing is required to make everything happen normally.  With an overactive bladder sometimes the muscles of the bladder contract unexpectedly and involuntarily and this causes an urgent need to urinate. The normal response is to try to hold urine in by contracting the sphincter muscles. Sometimes the bladder contracts so strongly that the sphincter muscles cannot stop the urine from passing out and incontinence occurs. This kind of incontinence is called urge incontinence.  Having an overactive bladder can be embarrassing and awkward. It can keep you from living life the way you want to. Many people think it is just something you have to put up with as you grow older or have certain health conditions. In fact, there are treatments that can help make your life easier and more pleasant.  CAUSES   Many  things can cause an overactive bladder. Possibilities include:  · Urinary tract infection or infection of nearby tissues such as the prostate.  · Prostate enlargement.  · In women, multiple pregnancies or surgery on the uterus or urethra.  · Bladder stones, inflammation or tumors.  · Caffeine.  · Alcohol.  · Medications. For example, diuretics (drugs that help the body get rid of extra fluid) increase urine production. Some other medicines must be taken with lots of fluids.  · Muscle or nerve weakness. This might be the result of a spinal cord injury, a stroke, multiple sclerosis or Parkinson's disease.  · Diabetes can cause a high urine volume which fills the bladder so quickly that the normal urge to urinate is triggered very strongly.  SYMPTOMS   · Loss of bladder control. You feel the need to urinate and cannot make your body wait.  · Sudden, strong urges to urinate.  · Urinating 8 or more times a day.  · Waking up to urinate two or more times a night.  DIAGNOSIS   To decide if you have overactive bladder, your healthcare provider will probably:  · Ask about symptoms you have noticed.  · Ask about your overall health. This will include questions about any medications you are taking.  · Do a physical examination. This will help determine if there are obvious blockages or other problems.  · Order some tests. These might include:  · A blood test to check for diabetes or   other health issues that could be contributing to the problem.  · Urine testing. This could measure the flow of urine and the pressure on the bladder.  · A test of your neurological system (the brain, spinal cord and nerves). This is the system that senses the need to urinate. Some of these tests are called flow tests, bladder pressure tests and electrical measurements of the sphincter muscle.  · A bladder test to check whether it is emptying completely when you urinate.  · Cytoscopy. This test uses a thin tube with a tiny camera on it. It offers a  look inside your urethra and bladder to see if there are problems.  · Imaging tests. You might be given a contrast dye and then asked to urinate. X-rays are taken to see how your bladder is working.  TREATMENT   An overactive bladder can be treated in many ways. The treatment will depend on the cause. Whether you have a mild or severe case also makes a difference. Often, treatment can be given in your healthcare provider's office or clinic. Be sure to discuss the different options with your caregiver. They include:  · Behavioral treatments. These do not involve medication or surgery:  · Bladder training. For this, you would follow a schedule to urinate at regular intervals. This helps you learn to control the urge to urinate. At first, you might be asked to wait a few minutes after feeling the urge. In time, you should be able to schedule bathroom visits an hour or more apart.  · Kegel exercises. These exercises strengthen the pelvic floor muscles, which support the bladder. By toning these muscles, they can help control urination, even if the bladder muscles are overactive. A specialist will teach you how to do these exercises correctly. They will require daily practice.  · Weight loss. If you are obese or overweight, losing weight might stop your bladder from being overactive. Talk to your healthcare provider about how many pounds you should lose. Also ask if there is a specific program or method that would work best for you.  · Diet change. This might be suggested if constipation is making your overactive bladder worse. Your healthcare provider or a nutritionist can explain ways to change what you eat to ease constipation. Other people might need to take in less caffeine or alcohol. Sometimes drinking fewer fluids is needed, too.  · Protection. This is not an actual treatment. But, you could wear special pads to take care of any leakage while you wait for other treatments to take effect. This will help you avoid  embarrassment.  · Physical treatments.  · Electrical stimulation. Electrodes will send gentle pulses to the nerves or muscles that help control the bladder. The goal is to strengthen them. Sometimes this is done with the electrodes outside of the body. Or, they might be placed inside the body (implanted). This treatment can take several months to have an effect.  · Medications. These are usually used along with other treatments. Several medicines are available. Some are injected into the muscles involved in urination. Others come in pill form. Medications sometimes prescribed include:  · Anticholinergics. These drugs block the signals that the nerves deliver to the bladder. This keeps it from releasing urine at the wrong time. Researchers think the drugs might help in other ways, too.  · Imipramine. This is an antidepressant. But, it relaxes bladder muscles.  · Botox. This is still experimental. Some people believe that injecting it into the   bladder muscles will relax them so they work more normally. It has also been injected into the sphincter muscle when the sphincter muscle does not open properly. This is a temporary fix, however. Also, it might make matters worse, especially in older people.  · Surgery.  · A device might be implanted to help manage your nerves. It works on the nerves that signal when you need to urinate.  · Surgery is sometimes needed with electrical stimulation. If the electrodes are implanted, this is done through surgery.  · Sometimes repairs need to be made through surgery. For example, the size of the bladder can be changed. This is usually done in severe cases only.  HOME CARE INSTRUCTIONS   · Take any medications your healthcare provider prescribed or suggested. Follow the directions carefully.  · Practice any lifestyle changes that are recommended. These might include:  · Drinking less fluid or drinking at different times of the day. If you need to urinate often during the night, for  example, you may need to stop drinking fluids early in the evening.  · Cutting down on caffeine or alcohol. They can both make an overactive bladder worse. Caffeine is found in coffee, tea and sodas.  · Doing Kegel exercises to strengthen muscles.  · Losing weight, if that is recommended.  · Eating a healthy and balanced diet. This will help you avoid constipation.  · Keep a journal or a log. You might be asked to record how much you drink and when, and also when you feel the need to urinate.  · Learn how to care for implants or other devices, such as pessaries.  SEEK MEDICAL CARE IF:   · Your overactive bladder gets worse.  · You feel increased pain or irritation when you urinate.  · You notice blood in your urine.  · You have questions about any medications or devices that your healthcare provider recommended.  · You notice blood, pus or swelling at the site of any test or treatment procedure.  · You have an oral temperature above 102° F (38.9° C).  SEEK IMMEDIATE MEDICAL CARE IF:   You have an oral temperature above 102° F (38.9° C), not controlled by medicine.  Document Released: 12/04/2008 Document Revised: 05/02/2011 Document Reviewed: 12/04/2008  ExitCare® Patient Information ©2014 [employer].

## 2012-11-15 NOTE — Progress Notes (Signed)
Patient ID: Pam [name], female   DOB: Jul 11, 1945, 67 y.o.   MRN: [id number] Presents with urinary frequency with nocturia 3-4 times per night that has been going on for years. States has slightly increased pressure. Denies pain or burning with urination, fever, or abdominal pain. Questions if she has yeast infection, her husband recently diagnosed with yeast due to numerous antibiotics he has been on. Has been in poor health  diverticulitis surgery causing colostomy, currently suffering with cholecystitis. Postmenopausal with no bleeding. Occasional hot flushes stopped HRT years ago.  Exam: Appears well. External genitalia slightly erythematous at introitus, speculum exam atrophic vaginitis, wet prep negative. Bimanual no CMT or adnexal fullness or tenderness. UA: Negative.  Atrophic vaginitis  Plan: Reviewed overactive bladder, urine culture pending. Discussed options for atrophic vaginitis has used Estrace cream in the past did not like. Will try Vagifem one applicator at bedtime per vagina for 2 weeks and then twice weekly thereafter sample, proper proper administration prescription given and reviewed. Reviewed office visit for evaluation of overactive bladder.

## 2012-11-20 ENCOUNTER — Ambulatory Visit: Payer: Medicare Other | Admitting: Women's Health

## 2013-02-20 ENCOUNTER — Other Ambulatory Visit: Payer: Self-pay

## 2013-02-20 DIAGNOSIS — F411 Generalized anxiety disorder: Secondary | ICD-10-CM

## 2013-02-20 MED ORDER — ALPRAZOLAM 0.25 MG PO TABS: 0.2500 mg | ORAL_TABLET | Freq: Three times a day (TID) | ORAL | Status: DC | PRN

## 2013-02-20 NOTE — Telephone Encounter (Signed)
Called into pharmacy

## 2013-03-21 ENCOUNTER — Other Ambulatory Visit (HOSPITAL_COMMUNITY): Payer: Self-pay | Admitting: Gastroenterology

## 2013-03-21 DIAGNOSIS — R140 Abdominal distension (gaseous): Secondary | ICD-10-CM

## 2013-04-03 ENCOUNTER — Encounter (HOSPITAL_COMMUNITY): Payer: Medicare Other

## 2013-07-17 ENCOUNTER — Encounter: Payer: Medicare Other | Admitting: Gynecology

## 2013-08-31 ENCOUNTER — Encounter (HOSPITAL_COMMUNITY): Payer: Self-pay | Admitting: Emergency Medicine

## 2013-08-31 ENCOUNTER — Emergency Department (HOSPITAL_COMMUNITY): Payer: Medicare HMO

## 2013-08-31 ENCOUNTER — Emergency Department (HOSPITAL_COMMUNITY)
Admission: EM | Admit: 2013-08-31 | Discharge: 2013-08-31 | Disposition: A | Discharge status: Home / Self Care | Payer: Medicare HMO | Attending: Emergency Medicine | Admitting: Emergency Medicine

## 2013-08-31 DIAGNOSIS — Z79899 Other long term (current) drug therapy: Secondary | ICD-10-CM | POA: Insufficient documentation

## 2013-08-31 DIAGNOSIS — Z8719 Personal history of other diseases of the digestive system: Secondary | ICD-10-CM | POA: Diagnosis not present

## 2013-08-31 DIAGNOSIS — Z8739 Personal history of other diseases of the musculoskeletal system and connective tissue: Secondary | ICD-10-CM | POA: Insufficient documentation

## 2013-08-31 DIAGNOSIS — R059 Cough, unspecified: Secondary | ICD-10-CM

## 2013-08-31 DIAGNOSIS — R05 Cough: Secondary | ICD-10-CM

## 2013-08-31 DIAGNOSIS — R0789 Other chest pain: Secondary | ICD-10-CM | POA: Diagnosis present

## 2013-08-31 HISTORY — DX: Chondrocostal junction syndrome (tietze): M94.0

## 2013-08-31 LAB — CBC
HCT: 41.7 % (ref 36.0–46.0)
Hemoglobin: 13.7 g/dL (ref 12.0–15.0)
MCH: 31.2 pg (ref 26.0–34.0)
MCHC: 32.9 g/dL (ref 30.0–36.0)
MCV: 95 fL (ref 78.0–100.0)
PLATELETS: 264 10*3/uL (ref 150–400)
RBC: 4.39 MIL/uL (ref 3.87–5.11)
RDW: 13.9 % (ref 11.5–15.5)
WBC: 4.9 10*3/uL (ref 4.0–10.5)

## 2013-08-31 LAB — BASIC METABOLIC PANEL
Anion gap: 20 — ABNORMAL HIGH (ref 5–15)
BUN: 13 mg/dL (ref 6–23)
CHLORIDE: 100 meq/L (ref 96–112)
CO2: 20 mEq/L (ref 19–32)
Calcium: 9.3 mg/dL (ref 8.4–10.5)
Creatinine, Ser: 0.62 mg/dL (ref 0.50–1.10)
GFR calc non Af Amer: >90 mL/min (ref >90)
Glucose, Bld: 102 mg/dL — ABNORMAL HIGH (ref 70–99)
POTASSIUM: 3.9 meq/L (ref 3.7–5.3)
SODIUM: 140 meq/L (ref 137–147)

## 2013-08-31 LAB — URINE MICROSCOPIC-ADD ON

## 2013-08-31 LAB — URINALYSIS, ROUTINE W REFLEX MICROSCOPIC
Bilirubin Urine: NEGATIVE
GLUCOSE, UA: NEGATIVE mg/dL
HGB URINE DIPSTICK: NEGATIVE
KETONES UR: NEGATIVE mg/dL
Nitrite: NEGATIVE
Protein, ur: NEGATIVE mg/dL
Specific Gravity, Urine: 1.01 (ref 1.005–1.030)
Urobilinogen, UA: 0.2 mg/dL (ref 0.0–1.0)
pH: 7.5 (ref 5.0–8.0)

## 2013-08-31 LAB — D-DIMER, QUANTITATIVE: D-Dimer, Quant: <0.27 ug/mL-FEU (ref 0.00–0.48)

## 2013-08-31 LAB — TROPONIN I

## 2013-08-31 MED ORDER — HYDROCOD POLST-CHLORPHEN POLST 10-8 MG/5ML PO LQCR: 5.0000 mL | Freq: Two times a day (BID) | ORAL | Status: DC | PRN

## 2013-08-31 NOTE — ED Provider Notes (Signed)
CSN: [id number]     Arrival date & time 08/31/13  1439 History   First MD Initiated Contact with Patient 08/31/13 1439     Chief Complaint  Patient presents with  . Chest Pain     (Consider location/radiation/quality/duration/timing/severity/associated sxs/prior Treatment) HPI Comments: Pt states that she is having pain with coughing in the center of her chest and it started last night. Pt states that he has been having a productive cough for the last couple of days. Pt states she is recently traveled to [place] to see her sister. No cough or swelling. No sob. Pt states that she was given asa today at urgent care. Pt states that she feels weak and tingly today, but she has been under a lot of stress with her siblings being sick  The history is provided by the patient. No language interpreter was used.    Past Medical History  Diagnosis Date  . Acid reflux   . Costochondritis    Past Surgical History  Procedure Laterality Date  . Tubal ligation    . Cholecystectomy    . Esophagus surgery  2008   Family History  Problem Relation Age of Onset  . Heart failure Brother    History  Substance Use Topics  . Smoking status: Never Smoker   . Smokeless tobacco: Never Used  . Alcohol Use: 1.5 oz/week    3 drink(s) per week     Comment: OCCASIONAL   OB History   Grav Para Term Preterm Abortions TAB SAB Ect Mult Living   3 3 3       3      Review of Systems  Constitutional: Negative.   Respiratory: Positive for cough and chest tightness.   Cardiovascular: Negative.       Allergies  Codeine and Macrodantin  Home Medications   Prior to Admission medications   Medication Sig Start Date End Date Taking? Authorizing Provider  ALPRAZolam (XANAX) 0.25 MG tablet Take 1 tablet (0.25 mg total) by mouth 3 (three) times daily as needed for sleep. 02/20/13   Huel Cote, NP  Calcium Carbonate-Vitamin D (OSCAL 500/200 D-3 PO) Take by mouth.    Historical Provider, MD  Cetirizine HCl  (ZYRTEC PO) Take by mouth.    Historical Provider, MD  Cyanocobalamin (VITAMIN B 12 PO) Take by mouth.    Historical Provider, MD  Estradiol 10 MCG TABS vaginal tablet Place 1 tablet (10 mcg total) vaginally 2 (two) times a week. 11/15/12   Huel Cote, NP  FLUoxetine (PROZAC) 20 MG tablet Take 20 mg by mouth daily.    Historical Provider, MD  Multiple Vitamins-Minerals (MULTIVITAMIN PO) Take by mouth.    Historical Provider, MD  OVER THE COUNTER MEDICATION VITAMIN B 6 PO    Historical Provider, MD  Vitamin D, Ergocalciferol, (DRISDOL) 50000 UNITS CAPS Take 50,000 Units by mouth.    Historical Provider, MD  VITAMIN E PO Take by mouth.    Historical Provider, MD   BP 132/68  Pulse 78  Temp(Src) 99.9 F (37.7 C) (Oral)  Resp 13  Ht 5' (1.524 m)  Wt 140 lb (63.504 kg)  BMI 27.34 kg/m2  SpO2 98% Physical Exam  Nursing note and vitals reviewed. Constitutional: She is oriented to person, place, and time. She appears well-developed and well-nourished.  HENT:  Head: Normocephalic and atraumatic.  Cardiovascular: Normal rate and regular rhythm.   Pulmonary/Chest: Effort normal and breath sounds normal. She exhibits no tenderness.  Musculoskeletal: Normal range of  motion.  Neurological: She is alert and oriented to person, place, and time.  Skin: Skin is warm and dry.    ED Course  Procedures (including critical care time) Labs Review Labs Reviewed  BASIC METABOLIC PANEL - Abnormal; Notable for the following:    Glucose, Bld 102 (*)    Anion gap 20 (*)    All other components within normal limits  URINALYSIS, ROUTINE W REFLEX MICROSCOPIC - Abnormal; Notable for the following:    Leukocytes, UA MODERATE (*)    All other components within normal limits  URINE MICROSCOPIC-ADD ON - Abnormal; Notable for the following:    Squamous Epithelial / LPF FEW (*)    Bacteria, UA FEW (*)    All other components within normal limits  URINE CULTURE  CBC  TROPONIN I  D-DIMER, QUANTITATIVE     Imaging Review Dg Chest 2 View  08/31/2013   CLINICAL DATA:  Chest pain and cough.  EXAM: CHEST  2 VIEW  COMPARISON:  CT chest 12/21/2004.  PA and lateral chest 07/16/2009.  FINDINGS: Heart size and mediastinal contours are within normal limits. Both lungs are clear. Visualized skeletal structures are unremarkable.  IMPRESSION: Negative exam.   Electronically Signed   By: Inge Rise M.D.   On: 08/31/2013 15:31     Date: 08/31/2013  Rate: 80  Rhythm: normal sinus rhythm  QRS Axis: normal  Intervals: normal  ST/T Wave abnormalities: normal  Conduction Disutrbances:none  Narrative Interpretation:   Old EKG Reviewed: none available    MDM   Final diagnoses:  Cough  Other chest pain    Pt cp free at this time. Doubt acs. Likely related to anxiety and cough. Will treat symptomatically with cough syrup. Pt is happy with plan    Glendell Docker, NP 08/31/13 1831

## 2013-08-31 NOTE — ED Provider Notes (Signed)
Medical screening examination/treatment/procedure(s) were performed by non-physician practitioner and as supervising physician I was immediately available for consultation/collaboration.   Date: 08/31/2013  Rate: 80  Rhythm: normal sinus rhythm  QRS Axis: normal  Intervals: normal  ST/T Wave abnormalities: normal  Conduction Disutrbances:none  Narrative Interpretation:   Old EKG Reviewed: none available     Veryl Speak, MD 08/31/13 1906

## 2013-08-31 NOTE — ED Notes (Signed)
Pt continues to be monitored by pulse ox and blood pressure.

## 2013-08-31 NOTE — ED Notes (Signed)
Pt placed into gown and on monitor upon arrival to room. Pt monitored by 12 lead, blood pressure, and pulse ox.  

## 2013-08-31 NOTE — Discharge Instructions (Signed)

## 2013-08-31 NOTE — ED Notes (Signed)
Sent urine culture req sheet to lab to add on to urine already in lab.

## 2013-08-31 NOTE — ED Notes (Signed)
[facility] presents with 68 yo female from urgent care/walk in clinic intitally called out for chest pain/ patient describes as bilateral lower rib pain from coughing excessively since yesterday.  Cough has been productive with yellow tinged sputum.  Pt has hx of costochrondritis so she states she has rib pain anyway but the cough and congestion has not helped.  Lungs sounds clear bilaterally and pt under stress also with sister dying in [place] and husband under treatment for prostate cancer.  Pt given 324 mg ASA at urgent care and denies any fever.

## 2013-09-02 ENCOUNTER — Emergency Department (HOSPITAL_BASED_OUTPATIENT_CLINIC_OR_DEPARTMENT_OTHER)
Admission: EM | Admit: 2013-09-02 | Discharge: 2013-09-02 | Disposition: A | Discharge status: Home / Self Care | Payer: Medicare HMO | Attending: Emergency Medicine | Admitting: Emergency Medicine

## 2013-09-02 ENCOUNTER — Encounter (HOSPITAL_BASED_OUTPATIENT_CLINIC_OR_DEPARTMENT_OTHER): Payer: Self-pay | Admitting: Emergency Medicine

## 2013-09-02 DIAGNOSIS — Z8719 Personal history of other diseases of the digestive system: Secondary | ICD-10-CM | POA: Insufficient documentation

## 2013-09-02 DIAGNOSIS — B029 Zoster without complications: Secondary | ICD-10-CM

## 2013-09-02 DIAGNOSIS — Z79899 Other long term (current) drug therapy: Secondary | ICD-10-CM | POA: Insufficient documentation

## 2013-09-02 LAB — URINE CULTURE: Colony Count: >=100000

## 2013-09-02 MED ORDER — VALACYCLOVIR HCL 1 G PO TABS: 1000.0000 mg | ORAL_TABLET | Freq: Three times a day (TID) | ORAL | Status: AC

## 2013-09-02 MED ORDER — OXYCODONE-ACETAMINOPHEN 5-325 MG PO TABS: 1.0000 | ORAL_TABLET | ORAL | Status: DC | PRN

## 2013-09-02 NOTE — Discharge Instructions (Signed)
Shingles °Shingles (herpes zoster) is an infection that is caused by the same virus that causes chickenpox (varicella). The infection causes a painful skin rash and fluid-filled blisters, which eventually break open, crust over, and heal. It may occur in any area of the body, but it usually affects only one side of the body or face. The pain of shingles usually lasts about 1 month. However, some people with shingles may develop long-term (chronic) pain in the affected area of the body. °Shingles often occurs many years after the person had chickenpox. It is more common: °· In people older than 50 years. °· In people with weakened immune systems, such as those with HIV, AIDS, or cancer. °· In people taking medicines that weaken the immune system, such as transplant medicines. °· In people under great stress. °CAUSES  °Shingles is caused by the varicella zoster virus (VZV), which also causes chickenpox. After a person is infected with the virus, it can remain in the person's body for years in an inactive state (dormant). To cause shingles, the virus reactivates and breaks out as an infection in a nerve root. °The virus can be spread from person to person (contagious) through contact with open blisters of the shingles rash. It will only spread to people who have not had chickenpox. When these people are exposed to the virus, they may develop chickenpox. They will not develop shingles. Once the blisters scab over, the person is no longer contagious and cannot spread the virus to others. °SYMPTOMS  °Shingles shows up in stages. The initial symptoms may be pain, itching, and tingling in an area of the skin. This pain is usually described as burning, stabbing, or throbbing. In a few days or weeks, a painful red rash will appear in the area where the pain, itching, and tingling were felt. The rash is usually on one side of the body in a band or belt-like pattern. Then, the rash usually turns into fluid-filled blisters. They  will scab over and dry up in approximately 2-3 weeks. °Flu-like symptoms may also occur with the initial symptoms, the rash, or the blisters. These may include: °· Fever. °· Chills. °· Headache. °· Upset stomach. °DIAGNOSIS  °Your caregiver will perform a skin exam to diagnose shingles. Skin scrapings or fluid samples may also be taken from the blisters. This sample will be examined under a microscope or sent to a lab for further testing. °TREATMENT  °There is no specific cure for shingles. Your caregiver will likely prescribe medicines to help you manage the pain, recover faster, and avoid long-term problems. This may include antiviral drugs, anti-inflammatory drugs, and pain medicines. °HOME CARE INSTRUCTIONS  °· Take a cool bath or apply cool compresses to the area of the rash or blisters as directed. This may help with the pain and itching.   °· Only take over-the-counter or prescription medicines as directed by your caregiver.   °· Rest as directed by your caregiver. °· Keep your rash and blisters clean with mild soap and cool water or as directed by your caregiver.  °· Do not pick your blisters or scratch your rash. Apply an anti-itch cream or numbing creams to the affected area as directed by your caregiver. °· Keep your shingles rash covered with a loose bandage (dressing). °· Avoid skin contact with: °¨ Babies.   °¨ Pregnant women.   °¨ Children with eczema.   °¨ Elderly people with transplants.   °¨ People with chronic illnesses, such as leukemia or AIDS.   °· Wear loose-fitting clothing to help ease the   pain of material rubbing against the rash. °· Keep all follow-up appointments with your caregiver. If the area involved is on your face, you may receive a referral for follow-up to a specialist, such as an eye doctor (ophthalmologist) or an ear, nose, and throat (ENT) doctor. Keeping all follow-up appointments will help you avoid eye complications, chronic pain, or disability.   °SEEK IMMEDIATE MEDICAL  CARE IF:  °· You have facial pain, pain around the eye area, or loss of feeling on one side of your face. °· You have ear pain or ringing in your ear. °· You have loss of taste. °· Your pain is not relieved with prescribed medicines.   °· Your redness or swelling spreads.   °· You have more pain and swelling.  °· Your condition is worsening or has changed.   °· You have a fever or persistent symptoms for more than 2-3 days. °· You have a fever and your symptoms suddenly get worse. °MAKE SURE YOU: °· Understand these instructions. °· Will watch your condition. °· Will get help right away if you are not doing well or get worse. °Document Released: 02/07/2005 Document Revised: 11/02/2011 Document Reviewed: 09/22/2011 °[employer]® Patient Information ©2015 [employer]. This information is not intended to replace advice given to you by your health care provider. Make sure you discuss any questions you have with your health care provider. ° °

## 2013-09-02 NOTE — ED Notes (Signed)
Pt amb to room 12 with quick steady gait in nad. Pt reports having burning pain to her side and around her back, with rash. No rash noted by this rn.

## 2013-09-02 NOTE — ED Provider Notes (Signed)
CSN: [id number]     Arrival date & time 09/02/13  0759 History   First MD Initiated Contact with Patient 09/02/13 [phone]     Chief Complaint  Patient presents with  . Rash     (Consider location/radiation/quality/duration/timing/severity/associated sxs/prior Treatment) Patient is a 68 y.o. female presenting with rash. The history is provided by the patient.  Rash  patient here complaining of left-sided flank skin burning without rash. Symptoms x24 hours. She is concerned that she has shingles but does not have any prior history of shingles. No fever or chills. No cough or congestion. No anginal chest pain. Symptoms persisted and no treatment used prior to arrival.  Past Medical History  Diagnosis Date  . Acid reflux   . Costochondritis    Past Surgical History  Procedure Laterality Date  . Tubal ligation    . Cholecystectomy    . Esophagus surgery  2008   Family History  Problem Relation Age of Onset  . Heart failure Brother    History  Substance Use Topics  . Smoking status: Never Smoker   . Smokeless tobacco: Never Used  . Alcohol Use: 1.5 oz/week    3 drink(s) per week     Comment: OCCASIONAL   OB History   Grav Para Term Preterm Abortions TAB SAB Ect Mult Living   3 3 3       3      Review of Systems  Skin: Positive for rash.  All other systems reviewed and are negative.     Allergies  Codeine and Macrodantin  Home Medications   Prior to Admission medications   Medication Sig Start Date End Date Taking? Authorizing Provider  Cetirizine HCl (ZYRTEC PO) Take 1 tablet by mouth daily.     Historical Provider, MD  chlorpheniramine-HYDROcodone (TUSSIONEX PENNKINETIC ER) 10-8 MG/5ML LQCR Take 5 mLs by mouth every 12 (twelve) hours as needed for cough. 08/31/13   Glendell Docker, NP  FLUoxetine (PROZAC) 20 MG tablet Take 20 mg by mouth daily.    Historical Provider, MD  oxyCODONE-acetaminophen (PERCOCET/ROXICET) 5-325 MG per tablet Take 1-2 tablets by mouth every 4  (four) hours as needed for severe pain. 09/02/13   Leota Jacobsen, MD  valACYclovir (VALTREX) 1000 MG tablet Take 1 tablet (1,000 mg total) by mouth 3 (three) times daily. 09/02/13 09/16/13  Leota Jacobsen, MD   BP 121/73  Pulse 89  Temp(Src) 99 F (37.2 C) (Oral)  Resp 18  Ht 5' (1.524 m)  Wt 140 lb (63.504 kg)  BMI 27.34 kg/m2  SpO2 96% Physical Exam  Nursing note and vitals reviewed. Constitutional: She is oriented to person, place, and time. She appears well-developed and well-nourished.  Non-toxic appearance. No distress.  HENT:  Head: Normocephalic and atraumatic.  Eyes: Conjunctivae, EOM and lids are normal. Pupils are equal, round, and reactive to light.  Neck: Normal range of motion. Neck supple. No tracheal deviation present. No mass present.  Cardiovascular: Normal rate, regular rhythm and normal heart sounds.  Exam reveals no gallop.   No murmur heard. Pulmonary/Chest: Effort normal and breath sounds normal. No stridor. No respiratory distress. She has no decreased breath sounds. She has no wheezes. She has no rhonchi. She has no rales.  Abdominal: Soft. Normal appearance and bowel sounds are normal. She exhibits no distension. There is no tenderness. There is no rebound and no CVA tenderness.  Musculoskeletal: Normal range of motion. She exhibits no edema and no tenderness.  Arms: Neurological: She is alert and oriented to person, place, and time. She has normal strength. No cranial nerve deficit or sensory deficit. GCS eye subscore is 4. GCS verbal subscore is 5. GCS motor subscore is 6.  Skin: Skin is warm and dry. No abrasion and no rash noted.  Psychiatric: She has a normal mood and affect. Her speech is normal and behavior is normal.    ED Course  Procedures (including critical care time) Labs Review Labs Reviewed - No data to display  Imaging Review Dg Chest 2 View  08/31/2013   CLINICAL DATA:  Chest pain and cough.  EXAM: CHEST  2 VIEW  COMPARISON:  CT  chest 12/21/2004.  PA and lateral chest 07/16/2009.  FINDINGS: Heart size and mediastinal contours are within normal limits. Both lungs are clear. Visualized skeletal structures are unremarkable.  IMPRESSION: Negative exam.   Electronically Signed   By: Inge Rise M.D.   On: 08/31/2013 15:31     EKG Interpretation None      MDM   Final diagnoses:  Herpes zoster    Patient to be treated for suspected shingles and will followup with her Dr.    Leota Jacobsen, MD 09/02/13 [phone]

## 2013-09-11 ENCOUNTER — Encounter: Payer: Medicare Other | Admitting: Gynecology

## 2013-09-16 ENCOUNTER — Other Ambulatory Visit: Payer: Self-pay | Admitting: Gynecology

## 2013-09-16 DIAGNOSIS — Z1231 Encounter for screening mammogram for malignant neoplasm of breast: Secondary | ICD-10-CM

## 2013-09-26 ENCOUNTER — Other Ambulatory Visit: Payer: Self-pay

## 2013-09-26 MED ORDER — ALPRAZOLAM 0.25 MG PO TABS: 0.2500 mg | ORAL_TABLET | Freq: Every evening | ORAL | Status: DC | PRN

## 2013-09-26 NOTE — Telephone Encounter (Signed)
Annual exam scheduled 10/30/13 with Dr. Loetta Rough.

## 2013-09-26 NOTE — Telephone Encounter (Signed)
Ok to refill 

## 2013-09-27 NOTE — Telephone Encounter (Signed)
Called into pharmacy

## 2013-10-21 ENCOUNTER — Ambulatory Visit (HOSPITAL_COMMUNITY)
Admission: RE | Admit: 2013-10-21 | Discharge: 2013-10-21 | Disposition: A | Discharge status: Home / Self Care | Payer: Medicare HMO | Source: Ambulatory Visit | Attending: Gynecology | Admitting: Gynecology

## 2013-10-21 DIAGNOSIS — Z1231 Encounter for screening mammogram for malignant neoplasm of breast: Secondary | ICD-10-CM

## 2013-10-30 ENCOUNTER — Ambulatory Visit (INDEPENDENT_AMBULATORY_CARE_PROVIDER_SITE_OTHER): Payer: Commercial Managed Care - HMO | Admitting: Gynecology

## 2013-10-30 ENCOUNTER — Encounter: Payer: Self-pay | Admitting: Gynecology

## 2013-10-30 VITALS — BP 116/72 | Ht 61.25 in | Wt 141.0 lb

## 2013-10-30 DIAGNOSIS — M949 Disorder of cartilage, unspecified: Secondary | ICD-10-CM

## 2013-10-30 DIAGNOSIS — N952 Postmenopausal atrophic vaginitis: Secondary | ICD-10-CM

## 2013-10-30 DIAGNOSIS — R1032 Left lower quadrant pain: Secondary | ICD-10-CM

## 2013-10-30 DIAGNOSIS — M8580 Other specified disorders of bone density and structure, unspecified site: Secondary | ICD-10-CM

## 2013-10-30 DIAGNOSIS — M899 Disorder of bone, unspecified: Secondary | ICD-10-CM

## 2013-10-30 NOTE — Patient Instructions (Signed)
Follow up for the ultrasound and bone density as scheduled.  You may obtain a copy of any labs that were done today by logging onto MyChart as outlined in the instructions provided with your AVS (after visit summary). The office will not call with normal lab results but certainly if there are any significant abnormalities then we will contact you.   Health Maintenance, Female A healthy lifestyle and preventative care can promote health and wellness.  Maintain regular health, dental, and eye exams.  Eat a healthy diet. Foods like vegetables, fruits, whole grains, low-fat dairy products, and lean protein foods contain the nutrients you need without too many calories. Decrease your intake of foods high in solid fats, added sugars, and salt. Get information about a proper diet from your caregiver, if necessary.  Regular physical exercise is one of the most important things you can do for your health. Most adults should get at least 150 minutes of moderate-intensity exercise (any activity that increases your heart rate and causes you to sweat) each week. In addition, most adults need muscle-strengthening exercises on 2 or more days a week.   Maintain a healthy weight. The body mass index (BMI) is a screening tool to identify possible weight problems. It provides an estimate of body fat based on height and weight. Your caregiver can help determine your BMI, and can help you achieve or maintain a healthy weight. For adults 20 years and older:  A BMI below 18.5 is considered underweight.  A BMI of 18.5 to 24.9 is normal.  A BMI of 25 to 29.9 is considered overweight.  A BMI of 30 and above is considered obese.  Maintain normal blood lipids and cholesterol by exercising and minimizing your intake of saturated fat. Eat a balanced diet with plenty of fruits and vegetables. Blood tests for lipids and cholesterol should begin at age 54 and be repeated every 5 years. If your lipid or cholesterol levels are  high, you are over 50, or you are a high risk for heart disease, you may need your cholesterol levels checked more frequently.Ongoing high lipid and cholesterol levels should be treated with medicines if diet and exercise are not effective.  If you smoke, find out from your caregiver how to quit. If you do not use tobacco, do not start.  Lung cancer screening is recommended for adults aged 109 80 years who are at high risk for developing lung cancer because of a history of smoking. Yearly low-dose computed tomography (CT) is recommended for people who have at least a 30-pack-year history of smoking and are a current smoker or have quit within the past 15 years. A pack year of smoking is smoking an average of 1 pack of cigarettes a day for 1 year (for example: 1 pack a day for 30 years or 2 packs a day for 15 years). Yearly screening should continue until the smoker has stopped smoking for at least 15 years. Yearly screening should also be stopped for people who develop a health problem that would prevent them from having lung cancer treatment.  If you are pregnant, do not drink alcohol. If you are breastfeeding, be very cautious about drinking alcohol. If you are not pregnant and choose to drink alcohol, do not exceed 1 drink per day. One drink is considered to be 12 ounces (355 mL) of beer, 5 ounces (148 mL) of wine, or 1.5 ounces (44 mL) of liquor.  Avoid use of street drugs. Do not share needles with anyone.  Ask for help if you need support or instructions about stopping the use of drugs.  High blood pressure causes heart disease and increases the risk of stroke. Blood pressure should be checked at least every 1 to 2 years. Ongoing high blood pressure should be treated with medicines, if weight loss and exercise are not effective.  If you are 88 to 68 years old, ask your caregiver if you should take aspirin to prevent strokes.  Diabetes screening involves taking a blood sample to check your fasting  blood sugar level. This should be done once every 3 years, after age 77, if you are within normal weight and without risk factors for diabetes. Testing should be considered at a younger age or be carried out more frequently if you are overweight and have at least 1 risk factor for diabetes.  Breast cancer screening is essential preventative care for women. You should practice "breast self-awareness." This means understanding the normal appearance and feel of your breasts and may include breast self-examination. Any changes detected, no matter how small, should be reported to a caregiver. Women in their 26s and 30s should have a clinical breast exam (CBE) by a caregiver as part of a regular health exam every 1 to 3 years. After age 32, women should have a CBE every year. Starting at age 34, women should consider having a mammogram (breast X-ray) every year. Women who have a family history of breast cancer should talk to their caregiver about genetic screening. Women at a high risk of breast cancer should talk to their caregiver about having an MRI and a mammogram every year.  Breast cancer gene (BRCA)-related cancer risk assessment is recommended for women who have family members with BRCA-related cancers. BRCA-related cancers include breast, ovarian, tubal, and peritoneal cancers. Having family members with these cancers may be associated with an increased risk for harmful changes (mutations) in the breast cancer genes BRCA1 and BRCA2. Results of the assessment will determine the need for genetic counseling and BRCA1 and BRCA2 testing.  The Pap test is a screening test for cervical cancer. Women should have a Pap test starting at age 28. Between ages 48 and 67, Pap tests should be repeated every 2 years. Beginning at age 20, you should have a Pap test every 3 years as long as the past 3 Pap tests have been normal. If you had a hysterectomy for a problem that was not cancer or a condition that could lead to  cancer, then you no longer need Pap tests. If you are between ages 55 and 63, and you have had normal Pap tests going back 10 years, you no longer need Pap tests. If you have had past treatment for cervical cancer or a condition that could lead to cancer, you need Pap tests and screening for cancer for at least 20 years after your treatment. If Pap tests have been discontinued, risk factors (such as a new sexual partner) need to be reassessed to determine if screening should be resumed. Some women have medical problems that increase the chance of getting cervical cancer. In these cases, your caregiver may recommend more frequent screening and Pap tests.  The human papillomavirus (HPV) test is an additional test that may be used for cervical cancer screening. The HPV test looks for the virus that can cause the cell changes on the cervix. The cells collected during the Pap test can be tested for HPV. The HPV test could be used to screen women aged 28 years  and older, and should be used in women of any age who have unclear Pap test results. After the age of 85, women should have HPV testing at the same frequency as a Pap test.  Colorectal cancer can be detected and often prevented. Most routine colorectal cancer screening begins at the age of 61 and continues through age 55. However, your caregiver may recommend screening at an earlier age if you have risk factors for colon cancer. On a yearly basis, your caregiver may provide home test kits to check for hidden blood in the stool. Use of a small camera at the end of a tube, to directly examine the colon (sigmoidoscopy or colonoscopy), can detect the earliest forms of colorectal cancer. Talk to your caregiver about this at age 56, when routine screening begins. Direct examination of the colon should be repeated every 5 to 10 years through age 20, unless early forms of pre-cancerous polyps or small growths are found.  Hepatitis C blood testing is recommended for  all people born from [birth year] through 1965 and any individual with known risks for hepatitis C.  Practice safe sex. Use condoms and avoid high-risk sexual practices to reduce the spread of sexually transmitted infections (STIs). Sexually active women aged 68 and younger should be checked for Chlamydia, which is a common sexually transmitted infection. Older women with new or multiple partners should also be tested for Chlamydia. Testing for other STIs is recommended if you are sexually active and at increased risk.  Osteoporosis is a disease in which the bones lose minerals and strength with aging. This can result in serious bone fractures. The risk of osteoporosis can be identified using a bone density scan. Women ages 42 and over and women at risk for fractures or osteoporosis should discuss screening with their caregivers. Ask your caregiver whether you should be taking a calcium supplement or vitamin D to reduce the rate of osteoporosis.  Menopause can be associated with physical symptoms and risks. Hormone replacement therapy is available to decrease symptoms and risks. You should talk to your caregiver about whether hormone replacement therapy is right for you.  Use sunscreen. Apply sunscreen liberally and repeatedly throughout the day. You should seek shade when your shadow is shorter than you. Protect yourself by wearing long sleeves, pants, a wide-brimmed hat, and sunglasses year round, whenever you are outdoors.  Notify your caregiver of new moles or changes in moles, especially if there is a change in shape or color. Also notify your caregiver if a mole is larger than the size of a pencil eraser.  Stay current with your immunizations. Document Released: 08/23/2010 Document Revised: 06/04/2012 Document Reviewed: 08/23/2010 [facility] Patient Information 2014 [place].

## 2013-10-30 NOTE — Progress Notes (Signed)
Pam [name] 07-07-1945 [id number]        68 y.o.  [id number] for follow up exam. Former patient of Dr. Cherylann Banas. Several issues noted below.  Past medical history,surgical history, problem list, medications, allergies, family history and social history were all reviewed and documented as reviewed in the EPIC chart.  ROS:  12 system ROS performed with pertinent positives and negatives included in the history, assessment and plan.   Additional significant findings :  none   Exam: Engineer, drilling Vitals:   10/30/13 1118  BP: 116/72  Height: 5' 1.25" (1.556 m)  Weight: 141 lb (63.957 kg)   General appearance:  Normal affect, orientation and appearance. Skin: Grossly normal HEENT: Without gross lesions.  No cervical or supraclavicular adenopathy. Thyroid normal.  Lungs:  Clear without wheezing, rales or rhonchi Cardiac: RR, without RMG Abdominal:  Soft, nontender, without masses, guarding, rebound, organomegaly or hernia Breasts:  Examined lying and sitting without masses, retractions, discharge or axillary adenopathy. Pelvic:  Ext/BUS/vagina with generalized atrophic changes  Cervix atrophic  Uterus axial to anteverted, normal size, shape and contour, midline and mobile nontender   Adnexa  Without masses or tenderness    Anus and perineum  Normal   Rectovaginal  Normal sphincter tone without palpated masses or tenderness.    Assessment/Plan:  68 y.o. G3P3003 female for follow up exam.   1. Postmenopausal/atrophic genital changes. Patient without significant symptoms of hot flushes, night sweats, vaginal dryness or dyspareunia. No vaginal bleeding. Continue to monitor. Report any vaginal bleeding. 2. Nagging left lower quadrant pain. Fleeting in nature lasting seconds to minutes. Patient having difficulty remembering when it started or if associated with bowel movements. No significant diarrhea or constipation. No abdominal bloating nausea or vomiting. Last colonoscopy 2009  historically. Exam is normal today. Recommend baseline ultrasound to rule out pelvic pathology. Assuming negative then recommend GI follow up. OC light stool blood check card given. With instructions to return and make sure that it's negative 3. Osteopenia. DEXA 06/2011 T score -2.2 FRAX 8.4%/0.8%. Recommend follow up DEXA now two-year interval. Increased calcium vitamin D reviewed. Recommend she ask her primary to check a vitamin D next time she has blood drawn and she agrees to do so. 4. Pap smear 2013. No Pap smear done today. No history of abnormal Pap smears. Options to stop screening altogether she is over the age of 11 with no history of significant abnormal Pap smears versus less frequent screening intervals reviewed. Will readdress on an annual basis. 5. Mammography 09/2013. Continue with annual mammography. SBE monthly reviewed. 6. Health maintenance. No routine blood work done as she reports this done at her primary physician's office. Follow up one year, sooner as needed.   Note: This document was prepared with digital dictation and possible smart phrase technology. Any transcriptional errors that result from this process are unintentional.   Anastasio Auerbach MD, 11:40 AM 10/30/2013

## 2013-10-31 LAB — URINALYSIS W MICROSCOPIC + REFLEX CULTURE
Casts: NONE SEEN
GLUCOSE, UA: NEGATIVE mg/dL
Hgb urine dipstick: NEGATIVE
Ketones, ur: NEGATIVE mg/dL
Nitrite: NEGATIVE
PH: 5.5 (ref 5.0–8.0)
Protein, ur: NEGATIVE mg/dL
Specific Gravity, Urine: 1.026 (ref 1.005–1.030)
Urobilinogen, UA: 0.2 mg/dL (ref 0.0–1.0)

## 2013-11-01 LAB — URINE CULTURE
COLONY COUNT: NO GROWTH
ORGANISM ID, BACTERIA: NO GROWTH

## 2013-11-14 ENCOUNTER — Encounter: Payer: Self-pay | Admitting: Gynecology

## 2013-11-14 ENCOUNTER — Ambulatory Visit (INDEPENDENT_AMBULATORY_CARE_PROVIDER_SITE_OTHER): Payer: Commercial Managed Care - HMO | Admitting: Gynecology

## 2013-11-14 ENCOUNTER — Ambulatory Visit: Payer: Commercial Managed Care - HMO | Admitting: Gynecology

## 2013-11-14 ENCOUNTER — Ambulatory Visit: Payer: Commercial Managed Care - HMO

## 2013-11-14 ENCOUNTER — Other Ambulatory Visit: Payer: Commercial Managed Care - HMO

## 2013-11-14 ENCOUNTER — Other Ambulatory Visit: Payer: Self-pay | Admitting: Gynecology

## 2013-11-14 ENCOUNTER — Ambulatory Visit (INDEPENDENT_AMBULATORY_CARE_PROVIDER_SITE_OTHER): Payer: Commercial Managed Care - HMO

## 2013-11-14 DIAGNOSIS — R1032 Left lower quadrant pain: Secondary | ICD-10-CM

## 2013-11-14 DIAGNOSIS — N858 Other specified noninflammatory disorders of uterus: Secondary | ICD-10-CM

## 2013-11-14 DIAGNOSIS — N83339 Acquired atrophy of ovary and fallopian tube, unspecified side: Secondary | ICD-10-CM

## 2013-11-14 DIAGNOSIS — N859 Noninflammatory disorder of uterus, unspecified: Secondary | ICD-10-CM

## 2013-11-14 NOTE — Progress Notes (Signed)
Pam [name] 04-24-1945 [id number]        68 y.o.  [id number] Presents for ultrasound due to complaints of chronic left lower quadrant pain.  Past medical history,surgical history, problem list, medications, allergies, family history and social history were all reviewed and documented in the EPIC chart.  Directed ROS with pertinent positives and negatives documented in the history of present illness/assessment and plan.  Ultrasound shows uterus normal size and echotexture. Endometrial echo 3.2 mm. Small amount of fluid within the cavity. Right adnexa negative. Ovary not visualized. Left ovary appears atrophic. No evidence of masses or other pathology in either right or left adnexa. Cul-de-sac is negative.  Assessment/Plan:  68 y.o. G3P3003 with intermittent chronic left lower quadrant pain of probable GI etiology. Recommend that she follow up with her gastroenterologist for further evaluation and she agrees to do so.  Follow up with me in one year when she is due for her annual exam.     Anastasio Auerbach MD, 3:01 PM 11/14/2013

## 2013-11-14 NOTE — Patient Instructions (Signed)
Follow up with your gastroenterologist if your abdominal pain continues.

## 2013-11-21 DIAGNOSIS — M8580 Other specified disorders of bone density and structure, unspecified site: Secondary | ICD-10-CM

## 2013-11-21 HISTORY — DX: Other specified disorders of bone density and structure, unspecified site: M85.80

## 2013-11-27 ENCOUNTER — Ambulatory Visit: Payer: Commercial Managed Care - HMO | Admitting: Gynecology

## 2013-12-09 ENCOUNTER — Ambulatory Visit (INDEPENDENT_AMBULATORY_CARE_PROVIDER_SITE_OTHER): Payer: Commercial Managed Care - HMO

## 2013-12-09 DIAGNOSIS — M8580 Other specified disorders of bone density and structure, unspecified site: Secondary | ICD-10-CM

## 2013-12-10 ENCOUNTER — Encounter: Payer: Self-pay | Admitting: Gynecology

## 2013-12-23 ENCOUNTER — Encounter: Payer: Self-pay | Admitting: Gynecology

## 2014-02-19 ENCOUNTER — Other Ambulatory Visit: Payer: Self-pay

## 2014-02-19 MED ORDER — ALPRAZOLAM 0.25 MG PO TABS: 0.2500 mg | ORAL_TABLET | Freq: Every evening | ORAL | Status: DC | PRN

## 2014-02-19 NOTE — Telephone Encounter (Signed)
I am not the original prescriber. Izora Gala prescribed this.

## 2014-02-19 NOTE — Telephone Encounter (Signed)
Called into pharmacy

## 2014-03-04 DIAGNOSIS — M7062 Trochanteric bursitis, left hip: Secondary | ICD-10-CM | POA: Diagnosis not present

## 2014-04-18 DIAGNOSIS — M7062 Trochanteric bursitis, left hip: Secondary | ICD-10-CM | POA: Diagnosis not present

## 2014-06-13 DIAGNOSIS — K602 Anal fissure, unspecified: Secondary | ICD-10-CM | POA: Diagnosis not present

## 2014-06-13 DIAGNOSIS — K644 Residual hemorrhoidal skin tags: Secondary | ICD-10-CM | POA: Diagnosis not present

## 2014-07-15 DIAGNOSIS — M25561 Pain in right knee: Secondary | ICD-10-CM | POA: Diagnosis not present

## 2014-07-30 DIAGNOSIS — M25561 Pain in right knee: Secondary | ICD-10-CM | POA: Diagnosis not present

## 2014-07-31 ENCOUNTER — Other Ambulatory Visit: Payer: Self-pay

## 2014-07-31 MED ORDER — ALPRAZOLAM 0.25 MG PO TABS: 0.2500 mg | ORAL_TABLET | Freq: Every evening | ORAL | Status: DC | PRN

## 2014-07-31 NOTE — Telephone Encounter (Signed)
Rx called in to pharmacy. 

## 2014-08-08 DIAGNOSIS — M25561 Pain in right knee: Secondary | ICD-10-CM | POA: Diagnosis not present

## 2014-08-14 DIAGNOSIS — H524 Presbyopia: Secondary | ICD-10-CM | POA: Diagnosis not present

## 2014-08-14 DIAGNOSIS — Z961 Presence of intraocular lens: Secondary | ICD-10-CM | POA: Diagnosis not present

## 2014-08-14 DIAGNOSIS — H26493 Other secondary cataract, bilateral: Secondary | ICD-10-CM | POA: Diagnosis not present

## 2014-08-15 DIAGNOSIS — M25561 Pain in right knee: Secondary | ICD-10-CM | POA: Diagnosis not present

## 2014-09-03 DIAGNOSIS — M1711 Unilateral primary osteoarthritis, right knee: Secondary | ICD-10-CM | POA: Diagnosis not present

## 2014-09-03 DIAGNOSIS — G8918 Other acute postprocedural pain: Secondary | ICD-10-CM | POA: Diagnosis not present

## 2014-09-03 DIAGNOSIS — S83241A Other tear of medial meniscus, current injury, right knee, initial encounter: Secondary | ICD-10-CM | POA: Diagnosis not present

## 2014-09-03 DIAGNOSIS — M179 Osteoarthritis of knee, unspecified: Secondary | ICD-10-CM | POA: Diagnosis not present

## 2014-09-03 DIAGNOSIS — Y929 Unspecified place or not applicable: Secondary | ICD-10-CM | POA: Diagnosis not present

## 2014-09-03 DIAGNOSIS — M659 Synovitis and tenosynovitis, unspecified: Secondary | ICD-10-CM | POA: Diagnosis not present

## 2014-09-03 DIAGNOSIS — S83281A Other tear of lateral meniscus, current injury, right knee, initial encounter: Secondary | ICD-10-CM | POA: Diagnosis not present

## 2014-09-03 DIAGNOSIS — X58XXXA Exposure to other specified factors, initial encounter: Secondary | ICD-10-CM | POA: Diagnosis not present

## 2014-09-24 DIAGNOSIS — S83241D Other tear of medial meniscus, current injury, right knee, subsequent encounter: Secondary | ICD-10-CM | POA: Diagnosis not present

## 2014-09-24 DIAGNOSIS — M1711 Unilateral primary osteoarthritis, right knee: Secondary | ICD-10-CM | POA: Diagnosis not present

## 2014-09-29 ENCOUNTER — Other Ambulatory Visit: Payer: Self-pay

## 2014-09-29 DIAGNOSIS — Z1231 Encounter for screening mammogram for malignant neoplasm of breast: Secondary | ICD-10-CM

## 2014-10-01 DIAGNOSIS — M25561 Pain in right knee: Secondary | ICD-10-CM | POA: Diagnosis not present

## 2014-10-09 DIAGNOSIS — M47816 Spondylosis without myelopathy or radiculopathy, lumbar region: Secondary | ICD-10-CM | POA: Diagnosis not present

## 2014-10-22 DIAGNOSIS — M47816 Spondylosis without myelopathy or radiculopathy, lumbar region: Secondary | ICD-10-CM | POA: Diagnosis not present

## 2014-10-29 DIAGNOSIS — M47816 Spondylosis without myelopathy or radiculopathy, lumbar region: Secondary | ICD-10-CM | POA: Diagnosis not present

## 2014-10-31 DIAGNOSIS — M47816 Spondylosis without myelopathy or radiculopathy, lumbar region: Secondary | ICD-10-CM | POA: Diagnosis not present

## 2014-11-04 ENCOUNTER — Other Ambulatory Visit: Payer: Self-pay

## 2014-11-04 MED ORDER — ALPRAZOLAM 0.25 MG PO TABS: 0.2500 mg | ORAL_TABLET | Freq: Every evening | ORAL | Status: DC | PRN

## 2014-11-04 NOTE — Telephone Encounter (Signed)
Called into pharmacy

## 2014-11-04 NOTE — Telephone Encounter (Signed)
Okay for #30 with 1 refill 

## 2014-11-05 DIAGNOSIS — M47816 Spondylosis without myelopathy or radiculopathy, lumbar region: Secondary | ICD-10-CM | POA: Diagnosis not present

## 2014-11-06 ENCOUNTER — Ambulatory Visit: Payer: Medicare Other

## 2014-11-10 ENCOUNTER — Ambulatory Visit
Admission: RE | Admit: 2014-11-10 | Discharge: 2014-11-10 | Disposition: A | Discharge status: Home / Self Care | Payer: Commercial Managed Care - HMO | Source: Ambulatory Visit

## 2014-11-10 DIAGNOSIS — Z1231 Encounter for screening mammogram for malignant neoplasm of breast: Secondary | ICD-10-CM

## 2014-11-13 DIAGNOSIS — M5442 Lumbago with sciatica, left side: Secondary | ICD-10-CM | POA: Diagnosis not present

## 2014-11-19 DIAGNOSIS — M5442 Lumbago with sciatica, left side: Secondary | ICD-10-CM | POA: Diagnosis not present

## 2014-11-20 ENCOUNTER — Encounter: Payer: Medicare Other | Admitting: Gynecology

## 2014-11-25 DIAGNOSIS — M5442 Lumbago with sciatica, left side: Secondary | ICD-10-CM | POA: Diagnosis not present

## 2014-12-22 DIAGNOSIS — M7062 Trochanteric bursitis, left hip: Secondary | ICD-10-CM | POA: Diagnosis not present

## 2014-12-22 DIAGNOSIS — M5442 Lumbago with sciatica, left side: Secondary | ICD-10-CM | POA: Diagnosis not present

## 2014-12-22 DIAGNOSIS — M7061 Trochanteric bursitis, right hip: Secondary | ICD-10-CM | POA: Diagnosis not present

## 2014-12-30 DIAGNOSIS — H43392 Other vitreous opacities, left eye: Secondary | ICD-10-CM | POA: Diagnosis not present

## 2015-01-06 DIAGNOSIS — F324 Major depressive disorder, single episode, in partial remission: Secondary | ICD-10-CM | POA: Diagnosis not present

## 2015-01-06 DIAGNOSIS — E785 Hyperlipidemia, unspecified: Secondary | ICD-10-CM | POA: Diagnosis not present

## 2015-01-06 DIAGNOSIS — Z0000 Encounter for general adult medical examination without abnormal findings: Secondary | ICD-10-CM | POA: Diagnosis not present

## 2015-01-06 DIAGNOSIS — Z131 Encounter for screening for diabetes mellitus: Secondary | ICD-10-CM | POA: Diagnosis not present

## 2015-01-06 DIAGNOSIS — Z23 Encounter for immunization: Secondary | ICD-10-CM | POA: Diagnosis not present

## 2015-01-29 ENCOUNTER — Encounter: Payer: Commercial Managed Care - HMO | Admitting: Gynecology

## 2015-02-04 DIAGNOSIS — M1711 Unilateral primary osteoarthritis, right knee: Secondary | ICD-10-CM | POA: Diagnosis not present

## 2015-02-20 ENCOUNTER — Other Ambulatory Visit: Payer: Self-pay

## 2015-02-20 MED ORDER — ALPRAZOLAM 0.25 MG PO TABS: 0.2500 mg | ORAL_TABLET | Freq: Every evening | ORAL | Status: DC | PRN

## 2015-02-20 NOTE — Telephone Encounter (Signed)
Called into pharmacy

## 2015-03-07 DIAGNOSIS — Z4789 Encounter for other orthopedic aftercare: Secondary | ICD-10-CM | POA: Diagnosis not present

## 2015-03-07 DIAGNOSIS — M1711 Unilateral primary osteoarthritis, right knee: Secondary | ICD-10-CM | POA: Diagnosis not present

## 2015-03-07 DIAGNOSIS — M79605 Pain in left leg: Secondary | ICD-10-CM | POA: Diagnosis not present

## 2015-03-07 DIAGNOSIS — M545 Low back pain: Secondary | ICD-10-CM | POA: Diagnosis not present

## 2015-03-24 DIAGNOSIS — M7712 Lateral epicondylitis, left elbow: Secondary | ICD-10-CM | POA: Diagnosis not present

## 2015-03-27 ENCOUNTER — Encounter: Payer: Self-pay | Admitting: Gynecology

## 2015-03-27 ENCOUNTER — Encounter: Payer: Commercial Managed Care - HMO | Admitting: Gynecology

## 2015-03-31 DIAGNOSIS — J029 Acute pharyngitis, unspecified: Secondary | ICD-10-CM | POA: Diagnosis not present

## 2015-04-01 DIAGNOSIS — M5417 Radiculopathy, lumbosacral region: Secondary | ICD-10-CM | POA: Diagnosis not present

## 2015-04-13 ENCOUNTER — Ambulatory Visit (INDEPENDENT_AMBULATORY_CARE_PROVIDER_SITE_OTHER): Payer: Commercial Managed Care - HMO | Admitting: Gynecology

## 2015-04-13 ENCOUNTER — Other Ambulatory Visit (HOSPITAL_COMMUNITY)
Admission: RE | Admit: 2015-04-13 | Discharge: 2015-04-13 | Disposition: A | Discharge status: Home / Self Care | Payer: Commercial Managed Care - HMO | Source: Ambulatory Visit | Attending: Gynecology | Admitting: Gynecology

## 2015-04-13 ENCOUNTER — Encounter: Payer: Self-pay | Admitting: Gynecology

## 2015-04-13 VITALS — BP 122/78 | Ht 60.5 in | Wt 148.0 lb

## 2015-04-13 DIAGNOSIS — Z1151 Encounter for screening for human papillomavirus (HPV): Secondary | ICD-10-CM | POA: Diagnosis not present

## 2015-04-13 DIAGNOSIS — N952 Postmenopausal atrophic vaginitis: Secondary | ICD-10-CM | POA: Diagnosis not present

## 2015-04-13 DIAGNOSIS — H6121 Impacted cerumen, right ear: Secondary | ICD-10-CM

## 2015-04-13 DIAGNOSIS — Z01419 Encounter for gynecological examination (general) (routine) without abnormal findings: Secondary | ICD-10-CM | POA: Diagnosis not present

## 2015-04-13 DIAGNOSIS — M8580 Other specified disorders of bone density and structure, unspecified site: Secondary | ICD-10-CM | POA: Diagnosis not present

## 2015-04-13 DIAGNOSIS — Z124 Encounter for screening for malignant neoplasm of cervix: Secondary | ICD-10-CM | POA: Diagnosis not present

## 2015-04-13 NOTE — Patient Instructions (Signed)

## 2015-04-13 NOTE — Addendum Note (Signed)
Addended by: Nelva Nay on: 04/13/2015 12:31 PM   Modules accepted: Orders

## 2015-04-13 NOTE — Progress Notes (Signed)
Pam [name] [birth year] [id number]        69 y.o.  G3P3003 for breast and pelvic exam. Several issues noted below.  Past medical history,surgical history, problem list, medications, allergies, family history and social history were all reviewed and documented as reviewed in the EPIC chart.  ROS:  Performed with pertinent positives and negatives included in the history, assessment and plan.   Additional significant findings :  Difficulty hearing   Exam: [name] assistant Filed Vitals:   04/13/15 1143  BP: 122/78  Height: 5' 0.5" (1.537 m)  Weight: 148 lb (67.132 kg)   General appearance:  Normal affect, orientation and appearance. Skin: Grossly normal HEENT: Without gross lesions.  No cervical or supraclavicular adenopathy. Thyroid normal. Right ear canal obstructed by cerumen. Left ear canal clear with tympanic membrane noted to be ruptured and scarred Lungs:  Clear without wheezing, rales or rhonchi Cardiac: RR, without RMG Abdominal:  Soft, nontender, without masses, guarding, rebound, organomegaly or hernia Breasts:  Examined lying and sitting without masses, retractions, discharge or axillary adenopathy. Pelvic:  Ext/BUS/vagina with atrophic changes  Cervix with atrophic changes. Pap smear done  Uterus anteverted, normal size, shape and contour, midline and mobile nontender   Adnexa without masses or tenderness    Anus and perineum normal   Rectovaginal normal sphincter tone without palpated masses or tenderness.    Assessment/Plan:  70 y.o. G15P3003 female for breast and pelvic exam.   1. Patient complaining of difficulty hearing. Has ruptured eardrum on the left as a childhood sequela my of untreated otitis media. Right canal obstructed by cerumen. Patient's going to buy OTC ear wax medication to clear. If continues to be an issue she'll follow up with ENT. 2. Postmenopausal/atrophic genital changes. Without significant hot flushes, night sweats, vaginal dryness or any  vaginal bleeding. Continue to monitor and report any issues or vaginal bleeding. 3. Osteopenia. DEXA 11/2013 T score -1.9 FRAX 10%/1.6%. Will order follow up DEXA next year when I see her at a 2+ year interval. Increased calcium and vitamin D reviewed. 4. Mammography 10/2014. Continue with annual mammography due. SBE monthly. 5. Colonoscopy 2009. Repeat at their recommended interval. 6. Pap smear 2013. Pap smear done today. No history of significant abnormal Pap smears previously. 7. Health maintenance. No routine lab work done as patient does this at her primary physician's office.  Follow up 1 year, sooner as needed.   Anastasio Auerbach MD, 12:03 PM 04/13/2015

## 2015-04-14 DIAGNOSIS — H9192 Unspecified hearing loss, left ear: Secondary | ICD-10-CM | POA: Diagnosis not present

## 2015-04-14 DIAGNOSIS — H6121 Impacted cerumen, right ear: Secondary | ICD-10-CM | POA: Diagnosis not present

## 2015-04-15 ENCOUNTER — Telehealth: Payer: Self-pay | Admitting: *Deleted

## 2015-04-15 LAB — CYTOLOGY - PAP

## 2015-04-15 MED ORDER — DIAZEPAM 2 MG PO TABS: 2.0000 mg | ORAL_TABLET | Freq: Four times a day (QID) | ORAL | Status: DC | PRN

## 2015-04-15 NOTE — Telephone Encounter (Signed)
Rx called in. Pt aware

## 2015-04-15 NOTE — Telephone Encounter (Signed)
Okay for Valium 2 mg #30 with 1 refill

## 2015-04-15 NOTE — Telephone Encounter (Signed)
Pt had annual on 04/13/15 states she forgot to mention to you about Rx to help her sleep at night. Pt has tried OTC and no relief, states she doesn't need Xanax. Pt tried a low dose Valium of her friends and done well with it, pt said she has a lot of family issues now with her husband who is sick. Please advise

## 2015-04-21 DIAGNOSIS — M1711 Unilateral primary osteoarthritis, right knee: Secondary | ICD-10-CM | POA: Diagnosis not present

## 2015-04-28 DIAGNOSIS — M1711 Unilateral primary osteoarthritis, right knee: Secondary | ICD-10-CM | POA: Diagnosis not present

## 2015-05-05 DIAGNOSIS — M1711 Unilateral primary osteoarthritis, right knee: Secondary | ICD-10-CM | POA: Diagnosis not present

## 2015-05-20 DIAGNOSIS — J0100 Acute maxillary sinusitis, unspecified: Secondary | ICD-10-CM | POA: Diagnosis not present

## 2015-06-16 DIAGNOSIS — S92811A Other fracture of right foot, initial encounter for closed fracture: Secondary | ICD-10-CM | POA: Diagnosis not present

## 2015-06-16 DIAGNOSIS — M7710 Lateral epicondylitis, unspecified elbow: Secondary | ICD-10-CM | POA: Diagnosis not present

## 2015-06-16 DIAGNOSIS — M1711 Unilateral primary osteoarthritis, right knee: Secondary | ICD-10-CM | POA: Diagnosis not present

## 2015-06-30 DIAGNOSIS — R350 Frequency of micturition: Secondary | ICD-10-CM | POA: Diagnosis not present

## 2015-06-30 DIAGNOSIS — M549 Dorsalgia, unspecified: Secondary | ICD-10-CM | POA: Diagnosis not present

## 2015-07-23 DIAGNOSIS — M25571 Pain in right ankle and joints of right foot: Secondary | ICD-10-CM | POA: Diagnosis not present

## 2015-07-23 DIAGNOSIS — M7711 Lateral epicondylitis, right elbow: Secondary | ICD-10-CM | POA: Diagnosis not present

## 2015-07-23 DIAGNOSIS — M1711 Unilateral primary osteoarthritis, right knee: Secondary | ICD-10-CM | POA: Diagnosis not present

## 2015-08-01 DIAGNOSIS — M5489 Other dorsalgia: Secondary | ICD-10-CM | POA: Diagnosis not present

## 2015-10-01 DIAGNOSIS — H7290 Unspecified perforation of tympanic membrane, unspecified ear: Secondary | ICD-10-CM | POA: Diagnosis not present

## 2015-10-01 DIAGNOSIS — R42 Dizziness and giddiness: Secondary | ICD-10-CM | POA: Diagnosis not present

## 2015-10-05 ENCOUNTER — Other Ambulatory Visit: Payer: Self-pay

## 2015-10-05 MED ORDER — ALPRAZOLAM 0.25 MG PO TABS: 0.2500 mg | ORAL_TABLET | Freq: Every evening | ORAL | 1 refills | Status: DC | PRN

## 2015-10-05 NOTE — Telephone Encounter (Signed)
I do not see when last refilled?  [place] for refill if not in last 3 mo

## 2015-10-05 NOTE — Telephone Encounter (Signed)
Last filled 02/20/2015 with one refill. Rx called in.

## 2015-10-06 ENCOUNTER — Other Ambulatory Visit: Payer: Self-pay | Admitting: Gynecology

## 2015-10-06 NOTE — Telephone Encounter (Signed)
Pharmacy notified that refill for Diazepam denied since Alprazolam was called in yesterday.

## 2015-10-06 NOTE — Telephone Encounter (Signed)
Dr. Loetta Rough- Her name was familiar and I realized [place] refilled Her Alprazolam yesterday. Just wanted to be sure you were aware of that.

## 2015-10-08 ENCOUNTER — Other Ambulatory Visit: Payer: Self-pay | Admitting: Gynecology

## 2015-10-14 DIAGNOSIS — H90A32 Mixed conductive and sensorineural hearing loss, unilateral, left ear with restricted hearing on the contralateral side: Secondary | ICD-10-CM | POA: Diagnosis not present

## 2015-10-14 DIAGNOSIS — H90A21 Sensorineural hearing loss, unilateral, right ear, with restricted hearing on the contralateral side: Secondary | ICD-10-CM | POA: Diagnosis not present

## 2015-10-15 ENCOUNTER — Other Ambulatory Visit: Payer: Self-pay | Admitting: Gynecology

## 2015-10-15 DIAGNOSIS — Z1231 Encounter for screening mammogram for malignant neoplasm of breast: Secondary | ICD-10-CM

## 2015-10-27 DIAGNOSIS — G9001 Carotid sinus syncope: Secondary | ICD-10-CM | POA: Diagnosis not present

## 2015-10-27 DIAGNOSIS — R51 Headache: Secondary | ICD-10-CM | POA: Diagnosis not present

## 2015-11-12 ENCOUNTER — Ambulatory Visit: Payer: Commercial Managed Care - HMO

## 2015-11-18 DIAGNOSIS — M7061 Trochanteric bursitis, right hip: Secondary | ICD-10-CM | POA: Diagnosis not present

## 2015-11-18 DIAGNOSIS — M25512 Pain in left shoulder: Secondary | ICD-10-CM | POA: Diagnosis not present

## 2015-12-08 DIAGNOSIS — H7292 Unspecified perforation of tympanic membrane, left ear: Secondary | ICD-10-CM | POA: Diagnosis not present

## 2015-12-08 DIAGNOSIS — H8111 Benign paroxysmal vertigo, right ear: Secondary | ICD-10-CM | POA: Diagnosis not present

## 2015-12-08 DIAGNOSIS — H8112 Benign paroxysmal vertigo, left ear: Secondary | ICD-10-CM | POA: Diagnosis not present

## 2015-12-08 DIAGNOSIS — R42 Dizziness and giddiness: Secondary | ICD-10-CM | POA: Diagnosis not present

## 2015-12-08 DIAGNOSIS — H9012 Conductive hearing loss, unilateral, left ear, with unrestricted hearing on the contralateral side: Secondary | ICD-10-CM | POA: Diagnosis not present

## 2015-12-08 DIAGNOSIS — H9191 Unspecified hearing loss, right ear: Secondary | ICD-10-CM | POA: Diagnosis not present

## 2015-12-08 DIAGNOSIS — K219 Gastro-esophageal reflux disease without esophagitis: Secondary | ICD-10-CM | POA: Diagnosis not present

## 2015-12-08 DIAGNOSIS — H90A32 Mixed conductive and sensorineural hearing loss, unilateral, left ear with restricted hearing on the contralateral side: Secondary | ICD-10-CM | POA: Diagnosis not present

## 2015-12-09 DIAGNOSIS — H26493 Other secondary cataract, bilateral: Secondary | ICD-10-CM | POA: Diagnosis not present

## 2015-12-09 DIAGNOSIS — H52223 Regular astigmatism, bilateral: Secondary | ICD-10-CM | POA: Diagnosis not present

## 2015-12-09 DIAGNOSIS — H43392 Other vitreous opacities, left eye: Secondary | ICD-10-CM | POA: Diagnosis not present

## 2015-12-09 DIAGNOSIS — H04123 Dry eye syndrome of bilateral lacrimal glands: Secondary | ICD-10-CM | POA: Diagnosis not present

## 2015-12-09 DIAGNOSIS — H524 Presbyopia: Secondary | ICD-10-CM | POA: Diagnosis not present

## 2015-12-09 DIAGNOSIS — H5213 Myopia, bilateral: Secondary | ICD-10-CM | POA: Diagnosis not present

## 2015-12-29 ENCOUNTER — Ambulatory Visit
Admission: RE | Admit: 2015-12-29 | Discharge: 2015-12-29 | Disposition: A | Discharge status: Home / Self Care | Payer: Commercial Managed Care - HMO | Source: Ambulatory Visit | Attending: Gynecology | Admitting: Gynecology

## 2015-12-29 DIAGNOSIS — Z1231 Encounter for screening mammogram for malignant neoplasm of breast: Secondary | ICD-10-CM

## 2015-12-30 DIAGNOSIS — H90A32 Mixed conductive and sensorineural hearing loss, unilateral, left ear with restricted hearing on the contralateral side: Secondary | ICD-10-CM | POA: Diagnosis not present

## 2015-12-30 DIAGNOSIS — H90A21 Sensorineural hearing loss, unilateral, right ear, with restricted hearing on the contralateral side: Secondary | ICD-10-CM | POA: Diagnosis not present

## 2016-01-06 DIAGNOSIS — Z23 Encounter for immunization: Secondary | ICD-10-CM | POA: Diagnosis not present

## 2016-01-06 DIAGNOSIS — E785 Hyperlipidemia, unspecified: Secondary | ICD-10-CM | POA: Diagnosis not present

## 2016-01-06 DIAGNOSIS — Z131 Encounter for screening for diabetes mellitus: Secondary | ICD-10-CM | POA: Diagnosis not present

## 2016-01-06 DIAGNOSIS — F324 Major depressive disorder, single episode, in partial remission: Secondary | ICD-10-CM | POA: Diagnosis not present

## 2016-01-06 DIAGNOSIS — R5383 Other fatigue: Secondary | ICD-10-CM | POA: Diagnosis not present

## 2016-01-06 DIAGNOSIS — M542 Cervicalgia: Secondary | ICD-10-CM | POA: Diagnosis not present

## 2016-01-07 DIAGNOSIS — M542 Cervicalgia: Secondary | ICD-10-CM | POA: Diagnosis not present

## 2016-01-11 DIAGNOSIS — M542 Cervicalgia: Secondary | ICD-10-CM | POA: Diagnosis not present

## 2016-01-11 DIAGNOSIS — R5383 Other fatigue: Secondary | ICD-10-CM | POA: Diagnosis not present

## 2016-01-11 DIAGNOSIS — E785 Hyperlipidemia, unspecified: Secondary | ICD-10-CM | POA: Diagnosis not present

## 2016-01-11 DIAGNOSIS — Z131 Encounter for screening for diabetes mellitus: Secondary | ICD-10-CM | POA: Diagnosis not present

## 2016-01-11 DIAGNOSIS — Z23 Encounter for immunization: Secondary | ICD-10-CM | POA: Diagnosis not present

## 2016-01-11 DIAGNOSIS — F324 Major depressive disorder, single episode, in partial remission: Secondary | ICD-10-CM | POA: Diagnosis not present

## 2016-01-13 DIAGNOSIS — Z0000 Encounter for general adult medical examination without abnormal findings: Secondary | ICD-10-CM | POA: Diagnosis not present

## 2016-01-13 DIAGNOSIS — F324 Major depressive disorder, single episode, in partial remission: Secondary | ICD-10-CM | POA: Diagnosis not present

## 2016-01-13 DIAGNOSIS — E785 Hyperlipidemia, unspecified: Secondary | ICD-10-CM | POA: Diagnosis not present

## 2016-01-13 DIAGNOSIS — M8580 Other specified disorders of bone density and structure, unspecified site: Secondary | ICD-10-CM | POA: Diagnosis not present

## 2016-01-22 DIAGNOSIS — M25512 Pain in left shoulder: Secondary | ICD-10-CM | POA: Diagnosis not present

## 2016-02-22 HISTORY — PX: BREAST BIOPSY: SHX20

## 2016-04-09 DIAGNOSIS — R69 Illness, unspecified: Secondary | ICD-10-CM | POA: Diagnosis not present

## 2016-04-14 ENCOUNTER — Encounter: Payer: Commercial Managed Care - HMO | Admitting: Gynecology

## 2016-04-20 ENCOUNTER — Ambulatory Visit (INDEPENDENT_AMBULATORY_CARE_PROVIDER_SITE_OTHER): Payer: Medicare HMO | Admitting: Gynecology

## 2016-04-20 ENCOUNTER — Encounter: Payer: Self-pay | Admitting: Gynecology

## 2016-04-20 VITALS — BP 124/74 | Ht 61.0 in | Wt 149.0 lb

## 2016-04-20 DIAGNOSIS — Z01411 Encounter for gynecological examination (general) (routine) with abnormal findings: Secondary | ICD-10-CM | POA: Diagnosis not present

## 2016-04-20 DIAGNOSIS — M797 Fibromyalgia: Secondary | ICD-10-CM | POA: Diagnosis not present

## 2016-04-20 DIAGNOSIS — K219 Gastro-esophageal reflux disease without esophagitis: Secondary | ICD-10-CM | POA: Diagnosis not present

## 2016-04-20 DIAGNOSIS — R198 Other specified symptoms and signs involving the digestive system and abdomen: Secondary | ICD-10-CM | POA: Diagnosis not present

## 2016-04-20 DIAGNOSIS — N952 Postmenopausal atrophic vaginitis: Secondary | ICD-10-CM

## 2016-04-20 DIAGNOSIS — R1013 Epigastric pain: Secondary | ICD-10-CM | POA: Diagnosis not present

## 2016-04-20 DIAGNOSIS — R1084 Generalized abdominal pain: Secondary | ICD-10-CM | POA: Diagnosis not present

## 2016-04-20 DIAGNOSIS — M899 Disorder of bone, unspecified: Secondary | ICD-10-CM

## 2016-04-20 NOTE — Progress Notes (Signed)
    Pam [name] 03/23/1945 [id number]        70 y.o.  G3P3003 for breast and pelvic [name].  Past medical history,surgical history, problem list, medications, allergies, family history and social history were all reviewed and documented as reviewed in the EPIC chart.  ROS:  Performed with pertinent positives and negatives included in the history, assessment and plan.   Additional significant findings :  None   [name]: [name] assistant Vitals:   04/20/16 1204  BP: 124/74  Weight: 149 lb (67.6 kg)  Height: 5\' 1"  (1.549 m)   Body mass index is 28.15 kg/m.  General appearance:  Normal affect, orientation and appearance. Skin: Grossly normal HEENT: Without gross lesions.  No cervical or supraclavicular adenopathy. Thyroid normal.  Lungs:  Clear without wheezing, rales or rhonchi Cardiac: RR, without RMG Abdominal:  Soft, nontender, without masses, guarding, rebound, organomegaly or hernia Breasts:  Examined lying and sitting without masses, retractions, discharge or axillary adenopathy. Pelvic:  Ext, BUS, Vagina: With atrophic changes  Cervix: With atrophic changes  Uterus: Anteverted, normal size, shape and contour, midline and mobile nontender   Adnexa: Without masses or tenderness    Anus and perineum: Normal   Rectovaginal: Normal sphincter tone without palpated masses or tenderness.    Assessment/Plan:  Pam [name]  1. Postmenopausal/atrophic genital changes. No significant hot flushes, night sweats, vaginal dryness or any vaginal bleeding. Continue to monitor report any issues or bleeding. 2. Osteopenia. DEXA 11/2013 T score -1.9 FRAX 10%/1.6%. Recommend repeat DEXA now 2+ year interval. Patient will schedule follow up for this. 3. Mammography 12/2015. Continue with annual mammography when due. SBE monthly reviewed. 4. Colonoscopy 2009 with reported repeat interval 10 years. 5. Pap smear/HPV 2017. No Pap smear done today. No  history of abnormal Pap smears previously. 6. Health maintenance. No routine lab work done as patient reports this done elsewhere. Follow up 1 year, sooner as needed.   Anastasio Auerbach MD, 12:18 PM 04/20/2016

## 2016-04-20 NOTE — Patient Instructions (Signed)
Followup for bone density as scheduled. 

## 2016-05-10 DIAGNOSIS — Z9889 Other specified postprocedural states: Secondary | ICD-10-CM | POA: Diagnosis not present

## 2016-05-10 DIAGNOSIS — D126 Benign neoplasm of colon, unspecified: Secondary | ICD-10-CM | POA: Diagnosis not present

## 2016-05-10 DIAGNOSIS — R194 Change in bowel habit: Secondary | ICD-10-CM | POA: Diagnosis not present

## 2016-05-10 DIAGNOSIS — D122 Benign neoplasm of ascending colon: Secondary | ICD-10-CM | POA: Diagnosis not present

## 2016-05-10 DIAGNOSIS — R1013 Epigastric pain: Secondary | ICD-10-CM | POA: Diagnosis not present

## 2016-05-10 DIAGNOSIS — K640 First degree hemorrhoids: Secondary | ICD-10-CM | POA: Diagnosis not present

## 2016-05-12 ENCOUNTER — Ambulatory Visit (INDEPENDENT_AMBULATORY_CARE_PROVIDER_SITE_OTHER): Payer: Medicare HMO

## 2016-05-12 ENCOUNTER — Encounter: Payer: Self-pay | Admitting: Gynecology

## 2016-05-12 ENCOUNTER — Other Ambulatory Visit: Payer: Self-pay | Admitting: Gynecology

## 2016-05-12 DIAGNOSIS — Z780 Asymptomatic menopausal state: Secondary | ICD-10-CM

## 2016-05-12 DIAGNOSIS — M899 Disorder of bone, unspecified: Secondary | ICD-10-CM

## 2016-05-12 DIAGNOSIS — M8589 Other specified disorders of bone density and structure, multiple sites: Secondary | ICD-10-CM

## 2016-05-12 DIAGNOSIS — R1013 Epigastric pain: Secondary | ICD-10-CM | POA: Diagnosis not present

## 2016-05-12 DIAGNOSIS — D122 Benign neoplasm of ascending colon: Secondary | ICD-10-CM | POA: Diagnosis not present

## 2016-05-12 DIAGNOSIS — Z9889 Other specified postprocedural states: Secondary | ICD-10-CM | POA: Diagnosis not present

## 2016-05-12 DIAGNOSIS — K640 First degree hemorrhoids: Secondary | ICD-10-CM | POA: Diagnosis not present

## 2016-05-12 DIAGNOSIS — R194 Change in bowel habit: Secondary | ICD-10-CM | POA: Diagnosis not present

## 2016-05-31 DIAGNOSIS — M545 Low back pain: Secondary | ICD-10-CM | POA: Diagnosis not present

## 2016-06-14 ENCOUNTER — Telehealth: Payer: Self-pay

## 2016-06-14 NOTE — Telephone Encounter (Signed)
Okay for the refill 0.25 Xanax daily when necessary #30 no refills.

## 2016-06-14 NOTE — Telephone Encounter (Signed)
Patient said she is out of this and [place] had prescribed it before. She would like Rx again.

## 2016-06-15 MED ORDER — ALPRAZOLAM 0.25 MG PO TABS: 0.2500 mg | ORAL_TABLET | Freq: Every evening | ORAL | 0 refills | Status: DC | PRN

## 2016-06-15 NOTE — Telephone Encounter (Signed)
Rx called in    Patient informed.

## 2016-07-06 ENCOUNTER — Encounter: Payer: Self-pay | Admitting: Gynecology

## 2016-07-07 DIAGNOSIS — M1711 Unilateral primary osteoarthritis, right knee: Secondary | ICD-10-CM | POA: Diagnosis not present

## 2016-07-13 DIAGNOSIS — M7061 Trochanteric bursitis, right hip: Secondary | ICD-10-CM | POA: Diagnosis not present

## 2016-08-10 ENCOUNTER — Encounter: Payer: Self-pay | Admitting: Gynecology

## 2016-08-10 ENCOUNTER — Ambulatory Visit (INDEPENDENT_AMBULATORY_CARE_PROVIDER_SITE_OTHER): Payer: Medicare HMO | Admitting: Gynecology

## 2016-08-10 ENCOUNTER — Telehealth: Payer: Self-pay | Admitting: *Deleted

## 2016-08-10 VITALS — BP 118/76

## 2016-08-10 DIAGNOSIS — R229 Localized swelling, mass and lump, unspecified: Secondary | ICD-10-CM

## 2016-08-10 DIAGNOSIS — N63 Unspecified lump in unspecified breast: Secondary | ICD-10-CM

## 2016-08-10 NOTE — Progress Notes (Signed)
    Pam [name] 1945-06-02 [id number]        71 y.o.  [name] presents complaining of left breast mass present for months. Has not changed since she first noticed it. Normal mammogram in November. No history of same before. Not tender and no nipple discharge noted  Past medical history,surgical history, problem list, medications, allergies, family history and social history were all reviewed and documented in the EPIC chart.  Directed ROS with pertinent positives and negatives documented in the history of present illness/assessment and plan.  Exam: Pam [name] assistant Vitals:   08/10/16 1219  BP: 118/76   General appearance:  Normal Both breast examined lying and sitting. Right without masses, retractions, discharge, adenopathy. Left breast proper without masses, retractions, discharge, adenopathy. Small 3-4 mm cutaneous nodule 1:00 position above the breast along anterior chest wall. Freely mobile and firm consistent with a benign cutaneous nodule. Physical Exam  Pulmonary/Chest:       Assessment/Plan:  71 y.o. G3P3003 with small skin nodule outside of breast tissue proper. Suspect a benign sebaceous cyst or other cutaneous nodule. Options for management reviewed with the patient to include observation with self-exam and reporting any changes, Gen. surgical excision, ultrasound for better definition. Patient would prefer ultrasound at this point and we will go ahead and make arrangements for this.  Greater than 50% of my time was spent in direct face to face counseling and coordination of care with the patient.     Pam Auerbach MD, 12:39 PM 08/10/2016

## 2016-08-10 NOTE — Telephone Encounter (Signed)
Appointment on 08/12/16 @ 11:40am at breast center, left message for pt to call.

## 2016-08-10 NOTE — Telephone Encounter (Signed)
-----   Message from Anastasio Auerbach, MD sent at 08/10/2016 12:43 PM EDT ----- Schedule at the breast center ultrasound reference small cutaneous nodule 1:00 position upper anterior chest wall.

## 2016-08-10 NOTE — Patient Instructions (Signed)
Follow up for ultrasound as scheduled. Call my office if you do not hear from the breast center to schedule.

## 2016-08-12 ENCOUNTER — Other Ambulatory Visit: Payer: Commercial Managed Care - HMO

## 2016-08-12 NOTE — Telephone Encounter (Signed)
Pt informed appointment was today, gave pt number to call and reschedule.

## 2016-08-22 ENCOUNTER — Ambulatory Visit
Admission: RE | Admit: 2016-08-22 | Discharge: 2016-08-22 | Disposition: A | Discharge status: Home / Self Care | Payer: Commercial Managed Care - HMO | Source: Ambulatory Visit | Attending: Gynecology | Admitting: Gynecology

## 2016-08-22 ENCOUNTER — Other Ambulatory Visit: Payer: Self-pay | Admitting: Gynecology

## 2016-08-22 DIAGNOSIS — N6322 Unspecified lump in the left breast, upper inner quadrant: Secondary | ICD-10-CM | POA: Diagnosis not present

## 2016-08-22 DIAGNOSIS — R928 Other abnormal and inconclusive findings on diagnostic imaging of breast: Secondary | ICD-10-CM | POA: Diagnosis not present

## 2016-08-22 DIAGNOSIS — N63 Unspecified lump in unspecified breast: Secondary | ICD-10-CM

## 2016-08-22 DIAGNOSIS — N6320 Unspecified lump in the left breast, unspecified quadrant: Secondary | ICD-10-CM

## 2016-08-30 ENCOUNTER — Ambulatory Visit
Admission: RE | Admit: 2016-08-30 | Discharge: 2016-08-30 | Disposition: A | Discharge status: Home / Self Care | Payer: Commercial Managed Care - HMO | Source: Ambulatory Visit | Attending: Gynecology | Admitting: Gynecology

## 2016-08-30 ENCOUNTER — Other Ambulatory Visit: Payer: Self-pay | Admitting: Gynecology

## 2016-08-30 DIAGNOSIS — N6320 Unspecified lump in the left breast, unspecified quadrant: Secondary | ICD-10-CM

## 2016-08-30 DIAGNOSIS — N641 Fat necrosis of breast: Secondary | ICD-10-CM | POA: Diagnosis not present

## 2016-08-30 DIAGNOSIS — N6322 Unspecified lump in the left breast, upper inner quadrant: Secondary | ICD-10-CM | POA: Diagnosis not present

## 2016-08-31 DIAGNOSIS — R002 Palpitations: Secondary | ICD-10-CM | POA: Diagnosis not present

## 2016-08-31 DIAGNOSIS — R0609 Other forms of dyspnea: Secondary | ICD-10-CM | POA: Diagnosis not present

## 2016-08-31 DIAGNOSIS — E785 Hyperlipidemia, unspecified: Secondary | ICD-10-CM | POA: Diagnosis not present

## 2016-09-01 DIAGNOSIS — K219 Gastro-esophageal reflux disease without esophagitis: Secondary | ICD-10-CM | POA: Diagnosis not present

## 2016-09-01 DIAGNOSIS — R002 Palpitations: Secondary | ICD-10-CM | POA: Diagnosis not present

## 2016-09-01 DIAGNOSIS — R0602 Shortness of breath: Secondary | ICD-10-CM | POA: Diagnosis not present

## 2016-09-01 DIAGNOSIS — E785 Hyperlipidemia, unspecified: Secondary | ICD-10-CM | POA: Diagnosis not present

## 2016-09-01 DIAGNOSIS — R5383 Other fatigue: Secondary | ICD-10-CM | POA: Diagnosis not present

## 2016-09-05 ENCOUNTER — Other Ambulatory Visit: Payer: Self-pay | Admitting: Cardiology

## 2016-09-05 ENCOUNTER — Ambulatory Visit
Admission: RE | Admit: 2016-09-05 | Discharge: 2016-09-05 | Disposition: A | Discharge status: Home / Self Care | Payer: Commercial Managed Care - HMO | Source: Ambulatory Visit | Attending: Cardiology | Admitting: Cardiology

## 2016-09-05 DIAGNOSIS — R0609 Other forms of dyspnea: Secondary | ICD-10-CM | POA: Diagnosis not present

## 2016-09-05 DIAGNOSIS — J90 Pleural effusion, not elsewhere classified: Secondary | ICD-10-CM | POA: Diagnosis not present

## 2016-09-05 DIAGNOSIS — R0602 Shortness of breath: Secondary | ICD-10-CM

## 2016-09-07 DIAGNOSIS — R0600 Dyspnea, unspecified: Secondary | ICD-10-CM | POA: Diagnosis not present

## 2016-09-08 ENCOUNTER — Other Ambulatory Visit: Payer: Self-pay | Admitting: Women's Health

## 2016-09-08 DIAGNOSIS — M7061 Trochanteric bursitis, right hip: Secondary | ICD-10-CM | POA: Diagnosis not present

## 2016-09-08 DIAGNOSIS — M25551 Pain in right hip: Secondary | ICD-10-CM | POA: Diagnosis not present

## 2016-09-08 NOTE — Telephone Encounter (Signed)
Ok for refill? 

## 2016-09-08 NOTE — Telephone Encounter (Signed)
Rx called in to pharmacy. 

## 2016-09-21 DIAGNOSIS — R159 Full incontinence of feces: Secondary | ICD-10-CM | POA: Diagnosis not present

## 2016-09-21 DIAGNOSIS — K6289 Other specified diseases of anus and rectum: Secondary | ICD-10-CM | POA: Diagnosis not present

## 2016-09-21 DIAGNOSIS — Z86010 Personal history of colonic polyps: Secondary | ICD-10-CM | POA: Diagnosis not present

## 2016-09-21 DIAGNOSIS — K219 Gastro-esophageal reflux disease without esophagitis: Secondary | ICD-10-CM | POA: Diagnosis not present

## 2016-09-29 DIAGNOSIS — R269 Unspecified abnormalities of gait and mobility: Secondary | ICD-10-CM | POA: Diagnosis not present

## 2016-10-07 NOTE — Patient Instructions (Signed)
Pam [name]  10/07/2016   Your procedure is scheduled on: 10/19/16  Report to [facility] Main  Entrance Take [place]  elevators to 3rd floor to  [place] at      [phone].    Call this number if you have problems the morning of surgery [phone]    Remember: ONLY 1 PERSON MAY GO WITH YOU TO SHORT STAY TO GET  READY MORNING OF YOUR SURGERY.  Do not eat food or drink liquids :After Midnight.     Take these medicines the morning of surgery with A SIP OF WATER: prozac, zyrtec, atorvastatin                                You may not have any metal on your body including hair pins and              piercings  Do not wear jewelry, make-up, lotions, powders or perfumes, deodorant             Do not wear nail polish.  Do not shave  48 hours prior to surgery.              Do not bring valuables to the hospital. [place].  Contacts, dentures or bridgework may not be worn into surgery.  Leave suitcase in the car. After surgery it may be brought to your room.                  Please read over the following fact sheets you were given: _____________________________________________________________________          [facility] - Preparing for Surgery Before surgery, you can play an important role.  Because skin is not sterile, your skin needs to be as free of germs as possible.  You can reduce the number of germs on your skin by washing with CHG (chlorahexidine gluconate) soap before surgery.  CHG is an antiseptic cleaner which kills germs and bonds with the skin to continue killing germs even after washing. Please DO NOT use if you have an allergy to CHG or antibacterial soaps.  If your skin becomes reddened/irritated stop using the CHG and inform your nurse when you arrive at Short Stay. Do not shave (including legs and underarms) for at least 48 hours prior to the first CHG shower.  You may shave your  face/neck. Please follow these instructions carefully:  1.  Shower with CHG Soap the night before surgery and the  morning of Surgery.  2.  If you choose to wash your hair, wash your hair first as usual with your  normal  shampoo.  3.  After you shampoo, rinse your hair and body thoroughly to remove the  shampoo.                           4.  Use CHG as you would any other liquid soap.  You can apply chg directly  to the skin and wash                       Gently with a scrungie or clean washcloth.  5.  Apply the CHG Soap to your  body ONLY FROM THE NECK DOWN.   Do not use on face/ open                           Wound or open sores. Avoid contact with eyes, ears mouth and genitals (private parts).                       Wash face,  Genitals (private parts) with your normal soap.             6.  Wash thoroughly, paying special attention to the area where your surgery  will be performed.  7.  Thoroughly rinse your body with warm water from the neck down.  8.  DO NOT shower/wash with your normal soap after using and rinsing off  the CHG Soap.                9.  Pat yourself dry with a clean towel.            10.  Wear clean pajamas.            11.  Place clean sheets on your bed the night of your first shower and do not  sleep with pets. Day of Surgery : Do not apply any lotions/deodorants the morning of surgery.  Please wear clean clothes to the hospital/surgery center.  FAILURE TO FOLLOW THESE INSTRUCTIONS MAY RESULT IN THE CANCELLATION OF YOUR SURGERY PATIENT SIGNATURE_________________________________  NURSE SIGNATURE__________________________________  ________________________________________________________________________   Pam [name]  An incentive spirometer is a tool that can help keep your lungs clear and active. This tool measures how well you are filling your lungs with each breath. Taking long deep breaths may help reverse or decrease the chance of developing breathing  (pulmonary) problems (especially infection) following:  A long period of time when you are unable to move or be active. BEFORE THE PROCEDURE   If the spirometer includes an indicator to show your best effort, your nurse or respiratory therapist will set it to a desired goal.  If possible, sit up straight or lean slightly forward. Try not to slouch.  Hold the incentive spirometer in an upright position. INSTRUCTIONS FOR USE  1. Sit on the edge of your bed if possible, or sit up as far as you can in bed or on a chair. 2. Hold the incentive spirometer in an upright position. 3. Breathe out normally. 4. Place the mouthpiece in your mouth and seal your lips tightly around it. 5. Breathe in slowly and as deeply as possible, raising the piston or the ball toward the top of the column. 6. Hold your breath for 3-5 seconds or for as long as possible. Allow the piston or ball to fall to the bottom of the column. 7. Remove the mouthpiece from your mouth and breathe out normally. 8. Rest for a few seconds and repeat Steps 1 through 7 at least 10 times every 1-2 hours when you are awake. Take your time and take a few normal breaths between deep breaths. 9. The spirometer may include an indicator to show your best effort. Use the indicator as a goal to work toward during each repetition. 10. After each set of 10 deep breaths, practice coughing to be sure your lungs are clear. If you have an incision (the cut made at the time of surgery), support your incision when coughing by placing a pillow or rolled up towels firmly against it.  Once you are able to get out of bed, walk around indoors and cough well. You may stop using the incentive spirometer when instructed by your caregiver.  RISKS AND COMPLICATIONS  Take your time so you do not get dizzy or light-headed.  If you are in pain, you may need to take or ask for pain medication before doing incentive spirometry. It is harder to take a deep breath if you  are having pain. AFTER USE  Rest and breathe slowly and easily.  It can be helpful to keep track of a log of your progress. Your caregiver can provide you with a simple table to help with this. If you are using the spirometer at home, follow these instructions: [place] IF:   You are having difficultly using the spirometer.  You have trouble using the spirometer as often as instructed.  Your pain medication is not giving enough relief while using the spirometer.  You develop fever of 100.5 F (38.1 C) or higher. SEEK IMMEDIATE MEDICAL CARE IF:   You cough up bloody sputum that had not been present before.  You develop fever of 102 F (38.9 C) or greater.  You develop worsening pain at or near the incision site. MAKE SURE YOU:   Understand these instructions.  Will watch your condition.  Will get help right away if you are not doing well or get worse. Document Released: 06/20/2006 Document Revised: 05/02/2011 Document Reviewed: 08/21/2006 [facility] Patient Information 2014 [place], [place].   ________________________________________________________________________

## 2016-10-10 ENCOUNTER — Encounter (HOSPITAL_COMMUNITY): Payer: Self-pay

## 2016-10-10 ENCOUNTER — Encounter (INDEPENDENT_AMBULATORY_CARE_PROVIDER_SITE_OTHER): Payer: Self-pay

## 2016-10-10 ENCOUNTER — Encounter (HOSPITAL_COMMUNITY)
Admission: RE | Admit: 2016-10-10 | Discharge: 2016-10-10 | Disposition: A | Discharge status: Home / Self Care | Payer: Medicare HMO | Source: Ambulatory Visit | Attending: Orthopedic Surgery | Admitting: Orthopedic Surgery

## 2016-10-10 DIAGNOSIS — Z01812 Encounter for preprocedural laboratory examination: Secondary | ICD-10-CM | POA: Insufficient documentation

## 2016-10-10 DIAGNOSIS — M7061 Trochanteric bursitis, right hip: Secondary | ICD-10-CM | POA: Insufficient documentation

## 2016-10-10 HISTORY — DX: Nausea with vomiting, unspecified: R11.2

## 2016-10-10 HISTORY — DX: Other specified postprocedural states: Z98.890

## 2016-10-10 HISTORY — DX: Gastro-esophageal reflux disease without esophagitis: K21.9

## 2016-10-10 HISTORY — DX: Anxiety disorder, unspecified: F41.9

## 2016-10-10 HISTORY — DX: Unspecified osteoarthritis, unspecified site: M19.90

## 2016-10-10 LAB — URINALYSIS, ROUTINE W REFLEX MICROSCOPIC
Bilirubin Urine: NEGATIVE
Glucose, UA: NEGATIVE mg/dL
Hgb urine dipstick: NEGATIVE
Ketones, ur: 5 mg/dL — AB
Nitrite: NEGATIVE
Protein, ur: NEGATIVE mg/dL
Specific Gravity, Urine: 1.021 (ref 1.005–1.030)
pH: 6 (ref 5.0–8.0)

## 2016-10-10 LAB — COMPREHENSIVE METABOLIC PANEL
ALT: 19 U/L (ref 14–54)
AST: 23 U/L (ref 15–41)
Albumin: 3.9 g/dL (ref 3.5–5.0)
Alkaline Phosphatase: 93 U/L (ref 38–126)
Anion gap: 7 (ref 5–15)
BUN: 18 mg/dL (ref 6–20)
CO2: 26 mmol/L (ref 22–32)
Calcium: 9.6 mg/dL (ref 8.9–10.3)
Chloride: 109 mmol/L (ref 101–111)
Creatinine, Ser: 0.79 mg/dL (ref 0.44–1.00)
GFR calc Af Amer: >60 mL/min (ref >60)
GFR calc non Af Amer: >60 mL/min (ref >60)
Glucose, Bld: 92 mg/dL (ref 65–99)
Potassium: 4.8 mmol/L (ref 3.5–5.1)
Sodium: 142 mmol/L (ref 135–145)
Total Bilirubin: 0.5 mg/dL (ref 0.3–1.2)
Total Protein: 6.6 g/dL (ref 6.5–8.1)

## 2016-10-10 LAB — CBC WITH DIFFERENTIAL/PLATELET
Basophils Absolute: 0 10*3/uL (ref 0.0–0.1)
Basophils Relative: 0 %
Eosinophils Absolute: 0.2 10*3/uL (ref 0.0–0.7)
Eosinophils Relative: 4 %
HCT: 40.3 % (ref 36.0–46.0)
Hemoglobin: 13.2 g/dL (ref 12.0–15.0)
Lymphocytes Relative: 50 %
Lymphs Abs: 2.7 10*3/uL (ref 0.7–4.0)
MCH: 32 pg (ref 26.0–34.0)
MCHC: 32.8 g/dL (ref 30.0–36.0)
MCV: 97.6 fL (ref 78.0–100.0)
Monocytes Absolute: 0.4 10*3/uL (ref 0.1–1.0)
Monocytes Relative: 8 %
Neutro Abs: 2 10*3/uL (ref 1.7–7.7)
Neutrophils Relative %: 38 %
Platelets: 243 10*3/uL (ref 150–400)
RBC: 4.13 MIL/uL (ref 3.87–5.11)
RDW: 14.3 % (ref 11.5–15.5)
WBC: 5.4 10*3/uL (ref 4.0–10.5)

## 2016-10-10 LAB — PROTIME-INR
INR: 0.93
Prothrombin Time: 12.5 seconds (ref 11.4–15.2)

## 2016-10-10 LAB — APTT: aPTT: 27 seconds (ref 24–36)

## 2016-10-11 NOTE — Progress Notes (Signed)
10-11-16 UA result routed to Dr. Gladstone Lighter for review.

## 2016-10-19 ENCOUNTER — Ambulatory Visit (HOSPITAL_COMMUNITY): Payer: Medicare HMO | Admitting: Anesthesiology

## 2016-10-19 ENCOUNTER — Encounter (HOSPITAL_COMMUNITY)
Admission: RE | Disposition: A | Discharge status: Home / Self Care | Payer: Self-pay | Source: Ambulatory Visit | Attending: Orthopedic Surgery

## 2016-10-19 ENCOUNTER — Observation Stay (HOSPITAL_COMMUNITY)
Admission: RE | Admit: 2016-10-19 | Discharge: 2016-10-20 | Disposition: A | Discharge status: Home / Self Care | Payer: Medicare HMO | Source: Ambulatory Visit | Attending: Orthopedic Surgery | Admitting: Orthopedic Surgery

## 2016-10-19 ENCOUNTER — Encounter (HOSPITAL_COMMUNITY): Payer: Self-pay | Admitting: *Deleted

## 2016-10-19 DIAGNOSIS — F419 Anxiety disorder, unspecified: Secondary | ICD-10-CM | POA: Insufficient documentation

## 2016-10-19 DIAGNOSIS — M8580 Other specified disorders of bone density and structure, unspecified site: Secondary | ICD-10-CM | POA: Insufficient documentation

## 2016-10-19 DIAGNOSIS — Z79899 Other long term (current) drug therapy: Secondary | ICD-10-CM | POA: Diagnosis not present

## 2016-10-19 DIAGNOSIS — K219 Gastro-esophageal reflux disease without esophagitis: Secondary | ICD-10-CM | POA: Insufficient documentation

## 2016-10-19 DIAGNOSIS — M7061 Trochanteric bursitis, right hip: Secondary | ICD-10-CM | POA: Diagnosis not present

## 2016-10-19 DIAGNOSIS — M7630 Iliotibial band syndrome, unspecified leg: Secondary | ICD-10-CM | POA: Diagnosis not present

## 2016-10-19 HISTORY — PX: EXCISION/RELEASE BURSA HIP: SHX5014

## 2016-10-19 LAB — CBC
HEMATOCRIT: 38 % (ref 36.0–46.0)
Hemoglobin: 12.5 g/dL (ref 12.0–15.0)
MCH: 32.1 pg (ref 26.0–34.0)
MCHC: 32.9 g/dL (ref 30.0–36.0)
MCV: 97.7 fL (ref 78.0–100.0)
PLATELETS: 215 10*3/uL (ref 150–400)
RBC: 3.89 MIL/uL (ref 3.87–5.11)
RDW: 14.1 % (ref 11.5–15.5)
WBC: 6.1 10*3/uL (ref 4.0–10.5)

## 2016-10-19 LAB — CREATININE, SERUM
Creatinine, Ser: 0.69 mg/dL (ref 0.44–1.00)
GFR calc Af Amer: >60 mL/min (ref >60)

## 2016-10-19 SURGERY — RELEASE, BURSA, TROCHANTERIC
Anesthesia: General | Site: Hip | Laterality: Right

## 2016-10-19 MED ORDER — FENTANYL CITRATE (PF) 100 MCG/2ML IJ SOLN
INTRAMUSCULAR | Status: AC
  Filled 2016-10-19: qty 2

## 2016-10-19 MED ORDER — ROCURONIUM BROMIDE 10 MG/ML (PF) SYRINGE
PREFILLED_SYRINGE | INTRAVENOUS | Status: DC | PRN
  Administered 2016-10-19: 50 mg via INTRAVENOUS

## 2016-10-19 MED ORDER — PROPOFOL 10 MG/ML IV BOLUS
INTRAVENOUS | Status: AC
  Filled 2016-10-19: qty 40

## 2016-10-19 MED ORDER — CHLORHEXIDINE GLUCONATE 0.12 % MT SOLN
15.0000 mL | Freq: Two times a day (BID) | OROMUCOSAL | Status: DC
  Administered 2016-10-19 (×2): 15 mL via OROMUCOSAL
  Filled 2016-10-19 (×2): qty 15

## 2016-10-19 MED ORDER — EPHEDRINE SULFATE-NACL 50-0.9 MG/10ML-% IV SOSY
PREFILLED_SYRINGE | INTRAVENOUS | Status: DC | PRN
  Administered 2016-10-19: 10 mg via INTRAVENOUS

## 2016-10-19 MED ORDER — HYDROMORPHONE HCL 2 MG PO TABS
2.0000 mg | ORAL_TABLET | ORAL | Status: DC | PRN
  Administered 2016-10-19 (×2): 2 mg via ORAL
  Filled 2016-10-19 (×2): qty 1

## 2016-10-19 MED ORDER — ACETAMINOPHEN 325 MG PO TABS: 650.0000 mg | ORAL_TABLET | Freq: Four times a day (QID) | ORAL | Status: DC | PRN

## 2016-10-19 MED ORDER — ATORVASTATIN CALCIUM 20 MG PO TABS
20.0000 mg | ORAL_TABLET | Freq: Every day | ORAL | Status: DC
  Administered 2016-10-19: 20 mg via ORAL

## 2016-10-19 MED ORDER — HYDROMORPHONE HCL 2 MG PO TABS: 2.0000 mg | ORAL_TABLET | ORAL | 0 refills | Status: DC | PRN

## 2016-10-19 MED ORDER — SCOPOLAMINE 1 MG/3DAYS TD PT72
MEDICATED_PATCH | TRANSDERMAL | Status: AC
  Filled 2016-10-19: qty 1

## 2016-10-19 MED ORDER — ACETAMINOPHEN 650 MG RE SUPP: 650.0000 mg | Freq: Four times a day (QID) | RECTAL | Status: DC | PRN

## 2016-10-19 MED ORDER — ORAL CARE MOUTH RINSE
15.0000 mL | Freq: Two times a day (BID) | OROMUCOSAL | Status: DC
  Administered 2016-10-19: 15 mL via OROMUCOSAL

## 2016-10-19 MED ORDER — METOCLOPRAMIDE HCL 5 MG/ML IJ SOLN: 5.0000 mg | Freq: Three times a day (TID) | INTRAMUSCULAR | Status: DC | PRN

## 2016-10-19 MED ORDER — ONDANSETRON HCL 4 MG PO TABS: 4.0000 mg | ORAL_TABLET | Freq: Four times a day (QID) | ORAL | Status: DC | PRN

## 2016-10-19 MED ORDER — HYDROMORPHONE HCL-NACL 0.5-0.9 MG/ML-% IV SOSY: 0.5000 mg | PREFILLED_SYRINGE | INTRAVENOUS | Status: DC | PRN

## 2016-10-19 MED ORDER — PROPOFOL 10 MG/ML IV BOLUS
INTRAVENOUS | Status: DC | PRN
  Administered 2016-10-19: 120 mg via INTRAVENOUS

## 2016-10-19 MED ORDER — SODIUM CHLORIDE 0.9 % IJ SOLN
INTRAMUSCULAR | Status: DC | PRN
  Administered 2016-10-19: 30 mL

## 2016-10-19 MED ORDER — CEFAZOLIN SODIUM-DEXTROSE 2-4 GM/100ML-% IV SOLN
INTRAVENOUS | Status: AC
  Filled 2016-10-19: qty 100

## 2016-10-19 MED ORDER — BUPIVACAINE LIPOSOME 1.3 % IJ SUSP
INTRAMUSCULAR | Status: DC | PRN
Start: 2016-10-19 — End: 2016-10-19
  Administered 2016-10-19: 20 mL

## 2016-10-19 MED ORDER — FENTANYL CITRATE (PF) 100 MCG/2ML IJ SOLN
INTRAMUSCULAR | Status: DC | PRN
  Administered 2016-10-19: 100 ug via INTRAVENOUS
  Administered 2016-10-19: 50 ug via INTRAVENOUS

## 2016-10-19 MED ORDER — LACTATED RINGERS IV SOLN
INTRAVENOUS | Status: DC
  Administered 2016-10-19 (×3): via INTRAVENOUS

## 2016-10-19 MED ORDER — FENTANYL CITRATE (PF) 250 MCG/5ML IJ SOLN
INTRAMUSCULAR | Status: AC
  Filled 2016-10-19: qty 5

## 2016-10-19 MED ORDER — FLUOXETINE HCL 20 MG PO CAPS: 20.0000 mg | ORAL_CAPSULE | Freq: Every day | ORAL | Status: DC

## 2016-10-19 MED ORDER — PHENYLEPHRINE 40 MCG/ML (10ML) SYRINGE FOR IV PUSH (FOR BLOOD PRESSURE SUPPORT)
PREFILLED_SYRINGE | INTRAVENOUS | Status: DC | PRN
  Administered 2016-10-19: 40 ug via INTRAVENOUS
  Administered 2016-10-19: 80 ug via INTRAVENOUS

## 2016-10-19 MED ORDER — POLYETHYLENE GLYCOL 3350 17 G PO PACK: 17.0000 g | PACK | Freq: Every day | ORAL | Status: DC | PRN

## 2016-10-19 MED ORDER — FLEET ENEMA 7-19 GM/118ML RE ENEM: 1.0000 | ENEMA | Freq: Once | RECTAL | Status: DC | PRN

## 2016-10-19 MED ORDER — SCOPOLAMINE 1 MG/3DAYS TD PT72
MEDICATED_PATCH | TRANSDERMAL | Status: DC | PRN
  Administered 2016-10-19: 1 via TRANSDERMAL

## 2016-10-19 MED ORDER — BISACODYL 5 MG PO TBEC: 5.0000 mg | DELAYED_RELEASE_TABLET | Freq: Every day | ORAL | Status: DC | PRN

## 2016-10-19 MED ORDER — METOCLOPRAMIDE HCL 5 MG PO TABS: 5.0000 mg | ORAL_TABLET | Freq: Three times a day (TID) | ORAL | Status: DC | PRN

## 2016-10-19 MED ORDER — HYDROMORPHONE HCL-NACL 0.5-0.9 MG/ML-% IV SOSY
0.2500 mg | PREFILLED_SYRINGE | INTRAVENOUS | Status: DC | PRN
  Administered 2016-10-19: 0.5 mg via INTRAVENOUS

## 2016-10-19 MED ORDER — SODIUM CHLORIDE 0.9 % IV SOLN
INTRAVENOUS | Status: AC
  Filled 2016-10-19: qty 500000

## 2016-10-19 MED ORDER — SODIUM CHLORIDE 0.9 % IJ SOLN
INTRAMUSCULAR | Status: AC
  Filled 2016-10-19: qty 50

## 2016-10-19 MED ORDER — ENOXAPARIN SODIUM 40 MG/0.4ML ~~LOC~~ SOLN: 40.0000 mg | SUBCUTANEOUS | Status: DC

## 2016-10-19 MED ORDER — MENTHOL 3 MG MT LOZG
1.0000 | LOZENGE | OROMUCOSAL | Status: DC | PRN
  Filled 2016-10-19: qty 9

## 2016-10-19 MED ORDER — METHOCARBAMOL 500 MG PO TABS
500.0000 mg | ORAL_TABLET | Freq: Four times a day (QID) | ORAL | Status: DC | PRN
  Administered 2016-10-19: 500 mg via ORAL
  Filled 2016-10-19: qty 1

## 2016-10-19 MED ORDER — CEFAZOLIN SODIUM-DEXTROSE 2-4 GM/100ML-% IV SOLN
2.0000 g | INTRAVENOUS | Status: AC
  Administered 2016-10-19: 2 g via INTRAVENOUS

## 2016-10-19 MED ORDER — BUPIVACAINE LIPOSOME 1.3 % IJ SUSP
20.0000 mL | Freq: Once | INTRAMUSCULAR | Status: DC
  Filled 2016-10-19: qty 20

## 2016-10-19 MED ORDER — SUGAMMADEX SODIUM 200 MG/2ML IV SOLN
INTRAVENOUS | Status: DC | PRN
  Administered 2016-10-19: 150 mg via INTRAVENOUS

## 2016-10-19 MED ORDER — MIDAZOLAM HCL 5 MG/5ML IJ SOLN: INTRAMUSCULAR | Status: DC | PRN

## 2016-10-19 MED ORDER — METHOCARBAMOL 1000 MG/10ML IJ SOLN
500.0000 mg | Freq: Four times a day (QID) | INTRAVENOUS | Status: DC | PRN
  Administered 2016-10-19: 500 mg via INTRAVENOUS
  Filled 2016-10-19: qty 550

## 2016-10-19 MED ORDER — LIDOCAINE 2% (20 MG/ML) 5 ML SYRINGE
INTRAMUSCULAR | Status: DC | PRN
  Administered 2016-10-19: 60 mg via INTRAVENOUS

## 2016-10-19 MED ORDER — CEFAZOLIN SODIUM-DEXTROSE 1-4 GM/50ML-% IV SOLN
1.0000 g | Freq: Four times a day (QID) | INTRAVENOUS | Status: AC
  Administered 2016-10-19 – 2016-10-20 (×3): 1 g via INTRAVENOUS
  Filled 2016-10-19 (×3): qty 50

## 2016-10-19 MED ORDER — CHLORHEXIDINE GLUCONATE 4 % EX LIQD: 60.0000 mL | Freq: Once | CUTANEOUS | Status: DC

## 2016-10-19 MED ORDER — PROMETHAZINE HCL 25 MG/ML IJ SOLN: 6.2500 mg | INTRAMUSCULAR | Status: DC | PRN

## 2016-10-19 MED ORDER — ALPRAZOLAM 0.5 MG PO TABS: 0.5000 mg | ORAL_TABLET | Freq: Every evening | ORAL | Status: DC | PRN

## 2016-10-19 MED ORDER — HYDROMORPHONE HCL-NACL 0.5-0.9 MG/ML-% IV SOSY
PREFILLED_SYRINGE | INTRAVENOUS | Status: AC
  Filled 2016-10-19: qty 1

## 2016-10-19 MED ORDER — POLYMYXIN B SULFATE 500000 UNITS IJ SOLR
INTRAMUSCULAR | Status: DC | PRN
  Administered 2016-10-19: 500 mL

## 2016-10-19 MED ORDER — METHOCARBAMOL 500 MG PO TABS: 500.0000 mg | ORAL_TABLET | Freq: Four times a day (QID) | ORAL | 0 refills | Status: DC | PRN

## 2016-10-19 MED ORDER — ONDANSETRON HCL 4 MG/2ML IJ SOLN: 4.0000 mg | Freq: Four times a day (QID) | INTRAMUSCULAR | Status: DC | PRN

## 2016-10-19 SURGICAL SUPPLY — 43 items
BAG ZIPLOCK 12X15 (MISCELLANEOUS) ×2 IMPLANT
BIT DRILL 2.4X128 (BIT) IMPLANT
BLADE EXTENDED COATED 6.5IN (ELECTRODE) ×2 IMPLANT
COVER SURGICAL LIGHT HANDLE (MISCELLANEOUS) ×2 IMPLANT
DERMABOND ADVANCED (GAUZE/BANDAGES/DRESSINGS)
DERMABOND ADVANCED .7 DNX12 (GAUZE/BANDAGES/DRESSINGS) IMPLANT
DRAPE INCISE IOBAN 66X45 STRL (DRAPES) ×2 IMPLANT
DRAPE ORTHO SPLIT 77X108 STRL (DRAPES) ×2
DRAPE SURG ORHT 6 SPLT 77X108 (DRAPES) ×2 IMPLANT
DRAPE U-SHAPE 47X51 STRL (DRAPES) ×2 IMPLANT
DRSG AQUACEL AG ADV 3.5X10 (GAUZE/BANDAGES/DRESSINGS) ×2 IMPLANT
DRSG EMULSION OIL 3X16 NADH (GAUZE/BANDAGES/DRESSINGS) IMPLANT
DRSG PAD ABDOMINAL 8X10 ST (GAUZE/BANDAGES/DRESSINGS) IMPLANT
DURAPREP 26ML APPLICATOR (WOUND CARE) ×2 IMPLANT
ELECT REM PT RETURN 15FT ADLT (MISCELLANEOUS) ×2 IMPLANT
GAUZE SPONGE 4X4 12PLY STRL (GAUZE/BANDAGES/DRESSINGS) IMPLANT
GLOVE BIOGEL PI IND STRL 6.5 (GLOVE) ×1 IMPLANT
GLOVE BIOGEL PI IND STRL 8.5 (GLOVE) ×1 IMPLANT
GLOVE BIOGEL PI INDICATOR 6.5 (GLOVE) ×1
GLOVE BIOGEL PI INDICATOR 8.5 (GLOVE) ×1
GLOVE ECLIPSE 8.0 STRL XLNG CF (GLOVE) ×2 IMPLANT
GLOVE SURG SS PI 6.5 STRL IVOR (GLOVE) ×2 IMPLANT
GOWN STRL REUS W/TWL LRG LVL3 (GOWN DISPOSABLE) ×2 IMPLANT
GOWN STRL REUS W/TWL XL LVL3 (GOWN DISPOSABLE) ×2 IMPLANT
KIT BASIN OR (CUSTOM PROCEDURE TRAY) ×2 IMPLANT
MANIFOLD NEPTUNE II (INSTRUMENTS) ×2 IMPLANT
NDL SAFETY ECLIPSE 18X1.5 (NEEDLE) ×1 IMPLANT
NEEDLE HYPO 18GX1.5 SHARP (NEEDLE) ×1
NS IRRIG 1000ML POUR BTL (IV SOLUTION) ×2 IMPLANT
PACK TOTAL JOINT (CUSTOM PROCEDURE TRAY) ×2 IMPLANT
PASSER SUT SWANSON 36MM LOOP (INSTRUMENTS) IMPLANT
POSITIONER SURGICAL ARM (MISCELLANEOUS) ×2 IMPLANT
STAPLER VISISTAT (STAPLE) IMPLANT
STRIP CLOSURE SKIN 1/2X4 (GAUZE/BANDAGES/DRESSINGS) ×2 IMPLANT
SUT ETHIBOND NAB CT1 #1 30IN (SUTURE) IMPLANT
SUT MNCRL AB 4-0 PS2 18 (SUTURE) ×2 IMPLANT
SUT VIC AB 0 CT1 36 (SUTURE) IMPLANT
SUT VIC AB 1 CT1 27 (SUTURE) ×2
SUT VIC AB 1 CT1 27XBRD ANTBC (SUTURE) ×2 IMPLANT
SUT VIC AB 2-0 CT1 27 (SUTURE) ×3
SUT VIC AB 2-0 CT1 TAPERPNT 27 (SUTURE) ×3 IMPLANT
SYR 20CC LL (SYRINGE) ×2 IMPLANT
TOWEL OR 17X26 10 PK STRL BLUE (TOWEL DISPOSABLE) ×2 IMPLANT

## 2016-10-19 NOTE — Anesthesia Preprocedure Evaluation (Signed)
Anesthesia Evaluation  Patient identified by MRN, date of birth, ID band Patient awake    Reviewed: Allergy & Precautions, NPO status , Patient's Chart, lab work & pertinent test results  History of Anesthesia Complications (+) PONV  Airway Mallampati: II  TM Distance: >3 FB Neck ROM: Full    Dental no notable dental hx.    Pulmonary neg pulmonary ROS,    Pulmonary exam normal breath sounds clear to auscultation       Cardiovascular negative cardio ROS Normal cardiovascular exam Rhythm:Regular Rate:Normal     Neuro/Psych negative neurological ROS  negative psych ROS   GI/Hepatic Neg liver ROS, GERD  ,  Endo/Other  negative endocrine ROS  Renal/GU negative Renal ROS  negative genitourinary   Musculoskeletal negative musculoskeletal ROS (+)   Abdominal   Peds negative pediatric ROS (+)  Hematology negative hematology ROS (+)   Anesthesia Other Findings   Reproductive/Obstetrics negative OB ROS                             Anesthesia Physical Anesthesia Plan  ASA: II  Anesthesia Plan: General   Post-op Pain Management:    Induction: Intravenous  PONV Risk Score and Plan: 3 and Ondansetron, Dexamethasone and Scopolamine patch - Pre-op  Airway Management Planned: Oral ETT  Additional Equipment:   Intra-op Plan:   Post-operative Plan: Extubation in OR  Informed Consent: I have reviewed the patients History and Physical, chart, labs and discussed the procedure including the risks, benefits and alternatives for the proposed anesthesia with the patient or authorized representative who has indicated his/her understanding and acceptance.   Dental advisory given  Plan Discussed with: CRNA and Surgeon  Anesthesia Plan Comments:         Anesthesia Quick Evaluation

## 2016-10-19 NOTE — Anesthesia Procedure Notes (Signed)
Procedure Name: Intubation Performed by: Racquelle Hyser J Pre-anesthesia Checklist: Patient identified, Emergency Drugs available, Suction available, Patient being monitored and Timeout performed Patient Re-evaluated:Patient Re-evaluated prior to induction Oxygen Delivery Method: Circle system utilized Preoxygenation: Pre-oxygenation with 100% oxygen Induction Type: IV induction Ventilation: Mask ventilation without difficulty Laryngoscope Size: Mac and 3 Grade View: Grade II Tube type: Oral Tube size: 7.0 mm Number of attempts: 1 Airway Equipment and Method: Stylet Placement Confirmation: ETT inserted through vocal cords under direct vision,  positive ETCO2,  CO2 detector and breath sounds checked- equal and bilateral Secured at: 21 cm Tube secured with: Tape Dental Injury: Teeth and Oropharynx as per pre-operative assessment        

## 2016-10-19 NOTE — Transfer of Care (Signed)
Immediate Anesthesia Transfer of Care Note  Patient: Pam [name]  Procedure(s) Performed: Procedure(s): Right hip bursectomy excision portion ileotibial band (Right)  Patient Location: PACU  Anesthesia Type:General  Level of Consciousness: awake, alert  and oriented  Airway & Oxygen Therapy: Patient Spontanous Breathing and Patient connected to face mask oxygen  Post-op Assessment: Report given to RN and Post -op Vital signs reviewed and stable  Post vital signs: Reviewed and stable  Last Vitals:  Vitals:   10/19/16 0542  BP: 119/71  Pulse: 87  Resp: 18  Temp: 36.8 C  SpO2: 98%    Last Pain:  Vitals:   10/19/16 0542  TempSrc: Oral         Complications: No apparent anesthesia complications

## 2016-10-19 NOTE — Discharge Instructions (Signed)
Do not remove the dressing unless is becomes saturated or compromised. If need be, replace dressing daily with gauze and paper tape.  Shower only, no tub bath. Follow up in office in 2 weeks Call if any temperatures greater than 101 or any wound complications: [phone] during the day and ask for Dr. Charlestine Night nurse, Brunilda Payor.

## 2016-10-19 NOTE — Anesthesia Postprocedure Evaluation (Signed)
Anesthesia Post Note  Patient: Pam [name]  Procedure(s) Performed: Procedure(s) (LRB): Right hip bursectomy excision portion ileotibial band (Right)     Patient location during evaluation: PACU Anesthesia Type: General Level of consciousness: awake and alert Pain management: pain level controlled Vital Signs Assessment: post-procedure vital signs reviewed and stable Respiratory status: spontaneous breathing, nonlabored ventilation, respiratory function stable and patient connected to nasal cannula oxygen Cardiovascular status: blood pressure returned to baseline and stable Postop Assessment: no signs of nausea or vomiting Anesthetic complications: no    Last Vitals:  Vitals:   10/19/16 0930 10/19/16 0935  BP: 132/73   Pulse: 87   Resp: 16 14  Temp:  (!) 36.4 C  SpO2: 96%     Last Pain:  Vitals:   10/19/16 0930  TempSrc:   PainSc: 2         RLE Motor Response: Purposeful movement (10/19/16 0935) RLE Sensation: Full sensation (10/19/16 0935)      Saron Tweed S

## 2016-10-19 NOTE — Interval H&P Note (Signed)
History and Physical Interval Note:  10/19/2016 7:13 AM  Pam [name]  has presented today for surgery, with the diagnosis of Severe trochanteric bursitis Right Hip  The various methods of treatment have been discussed with the patient and family. After consideration of risks, benefits and other options for treatment, the patient has consented to  Procedure(s): Right hip bursectomy (Right) as a surgical intervention .  The patient's history has been reviewed, patient examined, no change in status, stable for surgery.  I have reviewed the patient's chart and labs.  Questions were answered to the patient's satisfaction.     Colden Samaras A

## 2016-10-19 NOTE — H&P (Signed)
Pam [name] is an 71 y.o. female.   Chief Complaint: Pain over Lateral Right Hip HPI: Chronic pain over side of her Right Hip. And did not respond to several cortisone injections.  Past Medical History:  Diagnosis Date  . Anxiety   . Arthritis   . Costochondritis   . GERD (gastroesophageal reflux disease)    Had surgery for  . Osteopenia 04/2016   T score -2.0 FRAX 6%/1.2%  . PONV (postoperative nausea and vomiting)     Past Surgical History:  Procedure Laterality Date  . CHOLECYSTECTOMY    . ESOPHAGUS SURGERY  2008  . KNEE SURGERY     Arthroscopic  right  . TUBAL LIGATION      Family History  Problem Relation Age of Onset  . Heart failure Brother   . Liver disease Brother   . Glaucoma Sister    Social History:  reports that she has never smoked. She has never used smokeless tobacco. She reports that she drinks about 1.8 oz of alcohol per week . She reports that she does not use drugs.  Allergies:  Allergies  Allergen Reactions  . Codeine Nausea And Vomiting  . Macrodantin     CAUSES GERD    Medications Prior to Admission  Medication Sig Dispense Refill  . acetaminophen (TYLENOL) 500 MG tablet Take 1,000 mg by mouth every 8 (eight) hours as needed for mild pain or headache.    . ALPRAZolam (XANAX) 0.25 MG tablet TAKE 1 TABLET BY MOUTH AT BEDTIME AS NEEDED (Patient taking differently: TAKE 1 TABLET BY MOUTH AT BEDTIME AS NEEDED FOR SLEEP) 30 tablet 1  . ALPRAZolam (XANAX) 0.5 MG tablet Take 0.5 mg by mouth at bedtime as needed for sleep.     Marland Kitchen atorvastatin (LIPITOR) 20 MG tablet Take 20 mg by mouth daily.    . Cetirizine HCl (ZYRTEC PO) Take 1 tablet by mouth daily.     . Cholecalciferol (VITAMIN D3 PO) Take 1 tablet by mouth daily.    . Cyanocobalamin (B-12) 2500 MCG TABS Take 2,500 mcg by mouth daily.    Marland Kitchen FLUoxetine (PROZAC) 20 MG tablet Take 20 mg by mouth daily.    . Magnesium Oxide 250 MG TABS Take by mouth.    . naproxen sodium (ANAPROX) 220 MG tablet  Take 220 mg by mouth 2 (two) times daily as needed (pain).    . Omega-3 Fatty Acids (FISH OIL) 1000 MG CAPS Take 1,000 mg by mouth daily.    . traMADol (ULTRAM) 50 MG tablet Take 50 mg by mouth daily as needed for severe pain.      No results found for this or any previous visit (from the past 48 hour(s)). No results found.  Review of Systems  Constitutional: Negative.   HENT: Negative.   Eyes: Negative.   Respiratory: Negative.   Cardiovascular: Negative.   Gastrointestinal: Negative.   Genitourinary: Negative.   Musculoskeletal: Positive for joint pain.  Skin: Negative.   Neurological: Negative.   Endo/Heme/Allergies: Negative.   Psychiatric/Behavioral: Negative.     Blood pressure 119/71, pulse 87, temperature 98.2 F (36.8 C), temperature source Oral, resp. rate 18, height 5\' 1"  (1.549 m), weight 68 kg (150 lb), SpO2 98 %. Physical Exam  Constitutional: She appears well-developed.  HENT:  Head: Normocephalic.  Eyes: Pupils are equal, round, and reactive to light.  Neck: Normal range of motion.  Cardiovascular: Normal rate.   Respiratory: Effort normal.  GI: Soft.  Musculoskeletal:  Chronic pain over  right Greater Trochanter.  Neurological: She is alert.  Skin: Skin is warm.  Psychiatric: She has a normal mood and affect.     Assessment/Plan Bursectomy and release of Right IT band and repair of Gluteus Tendon.  Tobi Bastos, MD 10/19/2016, 7:09 AM

## 2016-10-19 NOTE — Brief Op Note (Signed)
10/19/2016  8:07 AM  PATIENT:  Pam [name]  71 y.o. female  PRE-OPERATIVE DIAGNOSIS:  Severe trochanteric bursitis Right Hip and IT Band Syndrome.  POST-OPERATIVE DIAGNOSIS:  Severe trochanteric bursitis Right Hip and IT Band Syndrome.  PROCEDURE:  Procedure(s): Right hip bursectomy excision portion ileotibial band (Right)  SURGEON:  Surgeon(s) and Role:    Latanya Maudlin, MD - Primary  PHYSICIAN ASSISTANT:Amber Svensen PA   ASSISTANTS: Ardeen Jourdain PA  ANESTHESIA:   general  EBL:  Total I/O In: 1000 [I.V.:1000] Out: 50 [Blood:50]  BLOOD ADMINISTERED:none  DRAINS: none   LOCAL MEDICATIONS USED:  OTHER 20cc of Exparel mixed with 20cc of Normal Saline.  SPECIMEN:  No Specimen  DISPOSITION OF SPECIMEN:  N/A  COUNTS:  YES  TOURNIQUET:  * No tourniquets in log *  DICTATION: .Other Dictation: Dictation Number [id number]  PLAN OF CARE: Admit for overnight observation  PATIENT DISPOSITION:  Stable in OR   Delay start of Pharmacological VTE agent (>24hrs) due to surgical blood loss or risk of bleeding: yes

## 2016-10-20 ENCOUNTER — Ambulatory Visit: Payer: Self-pay | Admitting: Orthopedic Surgery

## 2016-10-20 DIAGNOSIS — K219 Gastro-esophageal reflux disease without esophagitis: Secondary | ICD-10-CM | POA: Diagnosis not present

## 2016-10-20 DIAGNOSIS — M7630 Iliotibial band syndrome, unspecified leg: Secondary | ICD-10-CM | POA: Diagnosis not present

## 2016-10-20 DIAGNOSIS — F419 Anxiety disorder, unspecified: Secondary | ICD-10-CM | POA: Diagnosis not present

## 2016-10-20 DIAGNOSIS — M7061 Trochanteric bursitis, right hip: Secondary | ICD-10-CM | POA: Diagnosis not present

## 2016-10-20 DIAGNOSIS — M8580 Other specified disorders of bone density and structure, unspecified site: Secondary | ICD-10-CM | POA: Diagnosis not present

## 2016-10-20 DIAGNOSIS — Z79899 Other long term (current) drug therapy: Secondary | ICD-10-CM | POA: Diagnosis not present

## 2016-10-20 NOTE — Evaluation (Signed)
Physical Therapy Evaluation Patient Details Name: Pam [name] MRN: [id number] DOB: 02/27/1945 Today's Date: 10/20/2016   History of Present Illness  BURSECTOMY RIGHT HIP  Clinical Impression  Patient is ambulating well with RW.  Encouraged  Ambulation at Dc. No further PT.    Follow Up Recommendations No PT follow up    Equipment Recommendations  None recommended by PT    Recommendations for Other Services       Precautions / Restrictions Precautions Precautions: None      Mobility  Bed Mobility Overal bed mobility: Independent                Transfers Overall transfer level: Independent                  Ambulation/Gait Ambulation/Gait assistance: Modified independent (Device/Increase time) Ambulation Distance (Feet): 300 Feet Assistive device: Rolling walker (2 wheeled) Gait Pattern/deviations: Step-through pattern;Antalgic        Stairs            Wheelchair Mobility    Modified Rankin (Stroke Patients Only)       Balance                                             Pertinent Vitals/Pain Pain Assessment: 0-10 Pain Score: 0-No pain    Home Living Family/patient expects to be discharged to:: Private residence Living Arrangements: Spouse/significant other Available Help at Discharge: Family Type of Home: House Home Access: Level entry       Home Equipment: Environmental consultant - 2 wheels      Prior Function Level of Independence: Independent               Hand Dominance        Extremity/Trunk Assessment   Upper Extremity Assessment Upper Extremity Assessment: Overall WFL for tasks assessed    Lower Extremity Assessment Lower Extremity Assessment: RLE deficits/detail RLE Deficits / Details: appears slightly tight hip flexion    Cervical / Trunk Assessment Cervical / Trunk Assessment: Normal  Communication      Cognition Arousal/Alertness: Awake/alert Behavior During Therapy: WFL for tasks  assessed/performed Overall Cognitive Status: Within Functional Limits for tasks assessed                                        General Comments      Exercises     Assessment/Plan    PT Assessment Patent does not need any further PT services  PT Problem List         PT Treatment Interventions      PT Goals (Current goals can be found in the Care Plan section)  Acute Rehab PT Goals Patient Stated Goal: go home PT Goal Formulation: All assessment and education complete, DC therapy    Frequency     Barriers to discharge        Co-evaluation               AM-PAC PT "6 Clicks" Daily Activity  Outcome Measure Difficulty turning over in bed (including adjusting bedclothes, sheets and blankets)?: None Difficulty moving from lying on back to sitting on the side of the bed? : None Difficulty sitting down on and standing up from a chair with arms (e.g., wheelchair, bedside commode, etc,.)?: None  Help needed moving to and from a bed to chair (including a wheelchair)?: A Little Help needed walking in hospital room?: A Little Help needed climbing 3-5 steps with a railing? : A Little 6 Click Score: 21    End of Session   Activity Tolerance: Patient tolerated treatment well Patient left: in chair;with call bell/phone within reach Nurse Communication: Mobility status PT Visit Diagnosis: Difficulty in walking, not elsewhere classified (R26.2)    Time: 0086-7619 PT Time Calculation (min) (ACUTE ONLY): 20 min   Charges:   PT Evaluation $PT Eval Low Complexity: 1 Low     PT G Codes:   PT G-Codes **NOT FOR INPATIENT CLASS** Functional Assessment Tool Used: Clinical judgement Functional Limitation: Mobility: Walking and moving around Mobility: Walking and Moving Around Current Status (J0932): At least 1 percent but less than 20 percent impaired, limited or restricted Mobility: Walking and Moving Around Goal Status [phone]): At least 1 percent but less than  20 percent impaired, limited or restricted Mobility: Walking and Moving Around Discharge Status [phone]): At least 1 percent but less than 20 percent impaired, limited or restricted    [facility] PT [phone]  Claretha Cooper 10/20/2016, 9:02 AM

## 2016-10-20 NOTE — Progress Notes (Signed)
Subjective: 1 Day Post-Op Procedure(s) (LRB): Right hip bursectomy excision portion ileotibial band (Right) Patient reports pain as 1 on 0-10 scale. Doing very well. Up ambulating.Dressing dry.   Objective: Vital signs in last 24 hours: Temp:  [97.4 F (36.3 C)-99.2 F (37.3 C)] 98.2 F (36.8 C) (08/30 0454) Pulse Rate:  [79-101] 79 (08/30 0454) Resp:  [14-17] 16 (08/30 0454) BP: (100-138)/(47-96) 100/65 (08/30 0454) SpO2:  [95 %-100 %] 99 % (08/30 0454)  Intake/Output from previous day: 08/29 0701 - 08/30 0700 In: 3190 [P.O.:1540; I.V.:1600; IV Piggyback:50] Out: 500 [Urine:450; Blood:50] Intake/Output this shift: No intake/output data recorded.   Recent Labs  10/19/16 1016  HGB 12.5    Recent Labs  10/19/16 1016  WBC 6.1  RBC 3.89  HCT 38.0  PLT 215    Recent Labs  10/19/16 1016  CREATININE 0.69   No results for input(s): LABPT, INR in the last 72 hours.  Neurologically intact No cellulitis present  Assessment/Plan: 1 Day Post-Op Procedure(s) (LRB): Right hip bursectomy excision portion ileotibial band (Right) Up with therapy Discharge  today.  Almond Fitzgibbon A 10/20/2016, 7:23 AM

## 2016-10-20 NOTE — Progress Notes (Signed)
Discharge prior to being seen, no HH needs identified. [phone]

## 2016-10-20 NOTE — Op Note (Signed)
Pam [name], ROGOWSKI NO.:  [id number]  MEDICAL RECORD NO.:  [id number]  LOCATION:                                 FACILITY:  PHYSICIAN:  Kipp Brood. Kyreese Chio, M.D.DATE OF BIRTH:  03-25-1945  DATE OF PROCEDURE:  10/19/2016 DATE OF DISCHARGE:                              OPERATIVE REPORT   SURGEON:  Dr. Gladstone Lighter.  ASSISTANT:  Ardeen Jourdain, PA.  PREOPERATIVE DIAGNOSES: 1. Chronic bursitis, right hip. 2. Iliotibial band syndrome, right hip.  POSTOPERATIVE DIAGNOSES: 1. Chronic bursitis, right hip. 2. Iliotibial band syndrome, right hip.  OPERATION: 1. Bursectomy right greater trochanteric bursa, right hip. 2. Release of the iliotibial band, right hip.  DESCRIPTION OF PROCEDURE:  Under general anesthesia, routine orthopedic prep and draping of the right hip was carried out with the patient's left hip down, right hip up.  At this time, the appropriate time-out was carried out.  Also, marked the appropriate right hip in the holding area.  The patient had 2 g of IV Ancef.  At this time, an incision was made over the lateral aspect of the right hip.  Bleeders were identified and cauterized.  Self-retaining retractors were inserted.  I then separated the adipose tissue from the iliotibial band.  I marked an elliptical portion of the iliotibial band and excised that elliptical portion which was directly over the greater trochanter and bursa.  At this time, we identified the chronic inflamed greater trochanteric bursa and that was excised.  There was no other major pathology noted.  I thoroughly irrigated out the area and reapproximated the wound in the usual fashion.  Subcu Monocryl suture was used to close the wound. Sterile dressings were applied.  In postop, the patient will be admitted overnight for pain control.  At the end of the procedure, we injected 20 mL of Exparel with 20 mL of normal saline.          ______________________________ Kipp Brood.  Gladstone Lighter, M.D.     RAG/MEDQ  D:  10/19/2016  T:  10/20/2016  Job:  [id number]

## 2016-10-25 ENCOUNTER — Ambulatory Visit: Payer: Self-pay | Admitting: Orthopedic Surgery

## 2016-10-25 NOTE — Discharge Summary (Signed)
Physician Discharge Summary   Patient ID: Pam [name] MRN: [id number] DOB/AGE: 03-01-1945 71 y.o.  Admit date: 10/19/2016 Discharge date: 10/20/2016  Primary Diagnosis: Greater trochanteric bursitis, right hip   Admission Diagnoses:  Past Medical History:  Diagnosis Date  . Anxiety   . Arthritis   . Costochondritis   . GERD (gastroesophageal reflux disease)    Had surgery for  . Osteopenia 04/2016   T score -2.0 FRAX 6%/1.2%  . PONV (postoperative nausea and vomiting)    Discharge Diagnoses:   Active Problems:   Greater trochanteric bursitis of right hip  Estimated body mass index is 28.34 kg/m as calculated from the following:   Height as of this encounter: 5' 1" (1.549 m).   Weight as of this encounter: 68 kg (150 lb).  Procedure:  Procedure(s) (LRB): Right hip bursectomy excision portion ileotibial band (Right)   Consults: None  HPI: The patient is a 71 year old female who presented with the chief complaint of right lateral hip pain. She was diagnosed with greater trochanteric bursitis. Unfortunately, she did not see relief of her symptoms with conservative treatments including ice, heat, exercises, and cortisone injection.   Laboratory Data: Admission on 10/19/2016, Discharged on 10/20/2016  Component Date Value Ref Range Status  . WBC 10/19/2016 6.1  4.0 - 10.5 K/uL Final  . RBC 10/19/2016 3.89  3.87 - 5.11 MIL/uL Final  . Hemoglobin 10/19/2016 12.5  12.0 - 15.0 g/dL Final  . HCT 10/19/2016 38.0  36.0 - 46.0 % Final  . MCV 10/19/2016 97.7  78.0 - 100.0 fL Final  . MCH 10/19/2016 32.1  26.0 - 34.0 pg Final  . MCHC 10/19/2016 32.9  30.0 - 36.0 g/dL Final  . RDW 10/19/2016 14.1  11.5 - 15.5 % Final  . Platelets 10/19/2016 215  150 - 400 K/uL Final  . Creatinine, Ser 10/19/2016 0.69  0.44 - 1.00 mg/dL Final  . GFR calc non Af Amer 10/19/2016 >60  >60 mL/min Final  . GFR calc Af Amer 10/19/2016 >60  >60 mL/min Final   Comment: (NOTE) The eGFR has been  calculated using the CKD EPI equation. This calculation has not been validated in all clinical situations. eGFR's persistently <60 mL/min signify possible Chronic Kidney Disease.   Hospital Outpatient Visit on 10/10/2016  Component Date Value Ref Range Status  . aPTT 10/10/2016 27  24 - 36 seconds Final  . WBC 10/10/2016 5.4  4.0 - 10.5 K/uL Final  . RBC 10/10/2016 4.13  3.87 - 5.11 MIL/uL Final  . Hemoglobin 10/10/2016 13.2  12.0 - 15.0 g/dL Final  . HCT 10/10/2016 40.3  36.0 - 46.0 % Final  . MCV 10/10/2016 97.6  78.0 - 100.0 fL Final  . MCH 10/10/2016 32.0  26.0 - 34.0 pg Final  . MCHC 10/10/2016 32.8  30.0 - 36.0 g/dL Final  . RDW 10/10/2016 14.3  11.5 - 15.5 % Final  . Platelets 10/10/2016 243  150 - 400 K/uL Final  . Neutrophils Relative % 10/10/2016 38  % Final  . Neutro Abs 10/10/2016 2.0  1.7 - 7.7 K/uL Final  . Lymphocytes Relative 10/10/2016 50  % Final  . Lymphs Abs 10/10/2016 2.7  0.7 - 4.0 K/uL Final  . Monocytes Relative 10/10/2016 8  % Final  . Monocytes Absolute 10/10/2016 0.4  0.1 - 1.0 K/uL Final  . Eosinophils Relative 10/10/2016 4  % Final  . Eosinophils Absolute 10/10/2016 0.2  0.0 - 0.7 K/uL Final  . Basophils Relative  10/10/2016 0  % Final  . Basophils Absolute 10/10/2016 0.0  0.0 - 0.1 K/uL Final  . Sodium 10/10/2016 142  135 - 145 mmol/L Final  . Potassium 10/10/2016 4.8  3.5 - 5.1 mmol/L Final  . Chloride 10/10/2016 109  101 - 111 mmol/L Final  . CO2 10/10/2016 26  22 - 32 mmol/L Final  . Glucose, Bld 10/10/2016 92  65 - 99 mg/dL Final  . BUN 10/10/2016 18  6 - 20 mg/dL Final  . Creatinine, Ser 10/10/2016 0.79  0.44 - 1.00 mg/dL Final  . Calcium 10/10/2016 9.6  8.9 - 10.3 mg/dL Final  . Total Protein 10/10/2016 6.6  6.5 - 8.1 g/dL Final  . Albumin 10/10/2016 3.9  3.5 - 5.0 g/dL Final  . AST 10/10/2016 23  15 - 41 U/L Final  . ALT 10/10/2016 19  14 - 54 U/L Final  . Alkaline Phosphatase 10/10/2016 93  38 - 126 U/L Final  . Total Bilirubin 10/10/2016  0.5  0.3 - 1.2 mg/dL Final  . GFR calc non Af Amer 10/10/2016 >60  >60 mL/min Final  . GFR calc Af Amer 10/10/2016 >60  >60 mL/min Final   Comment: (NOTE) The eGFR has been calculated using the CKD EPI equation. This calculation has not been validated in all clinical situations. eGFR's persistently <60 mL/min signify possible Chronic Kidney Disease.   . Anion gap 10/10/2016 7  5 - 15 Final  . Prothrombin Time 10/10/2016 12.5  11.4 - 15.2 seconds Final  . INR 10/10/2016 0.93   Final  . Color, Urine 10/10/2016 YELLOW  YELLOW Final  . APPearance 10/10/2016 HAZY* CLEAR Final  . Specific Gravity, Urine 10/10/2016 1.021  1.005 - 1.030 Final  . pH 10/10/2016 6.0  5.0 - 8.0 Final  . Glucose, UA 10/10/2016 NEGATIVE  NEGATIVE mg/dL Final  . Hgb urine dipstick 10/10/2016 NEGATIVE  NEGATIVE Final  . Bilirubin Urine 10/10/2016 NEGATIVE  NEGATIVE Final  . Ketones, ur 10/10/2016 5* NEGATIVE mg/dL Final  . Protein, ur 10/10/2016 NEGATIVE  NEGATIVE mg/dL Final  . Nitrite 10/10/2016 NEGATIVE  NEGATIVE Final  . Leukocytes, UA 10/10/2016 TRACE* NEGATIVE Final  . RBC / HPF 10/10/2016 0-5  0 - 5 RBC/hpf Final  . WBC, UA 10/10/2016 0-5  0 - 5 WBC/hpf Final  . Bacteria, UA 10/10/2016 RARE* NONE SEEN Final  . Squamous Epithelial / LPF 10/10/2016 6-30* NONE SEEN Final     X-Rays:No results found.  EKG: Orders placed or performed during the hospital encounter of 08/31/13  . EKG 12-Lead  . EKG 12-Lead  . EKG     Hospital Course: TRULA FREDE is a 71 y.o. who was admitted to [facility]. They were brought to the operating room on 10/19/2016 and underwent Procedure(s): Right hip bursectomy excision portion ileotibial band.  Patient tolerated the procedure well and was later transferred to the recovery room and then to the orthopaedic floor for postoperative care.  They were given PO and IV analgesics for pain control following their surgery.  They were given 24 hours of postoperative antibiotics  of  Anti-infectives    Start     Dose/Rate Route Frequency Ordered Stop   10/19/16 1400  ceFAZolin (ANCEF) IVPB 1 g/50 mL premix     1 g 100 mL/hr over 30 Minutes Intravenous Every 6 hours 10/19/16 0958 10/20/16 0241   10/19/16 0756  polymyxin B 500,000 Units, bacitracin 50,000 Units in sodium chloride 0.9 % 500 mL irrigation  Status:  Discontinued       As needed 10/19/16 0756 10/19/16 0843   10/19/16 0646  ceFAZolin (ANCEF) 2-4 GM/100ML-% IVPB    Comments:  Waldron Session   : cabinet override      10/19/16 [phone] 10/19/16 0735   10/19/16 0543  ceFAZolin (ANCEF) IVPB 2g/100 mL premix     2 g 200 mL/hr over 30 Minutes Intravenous On call to O.R. 10/19/16 0543 10/19/16 0805    Patient had a good night on the evening of surgery.  They started to get up OOB with therapy on day one. Hemovac drain was pulled without difficulty.  The patient had progressed with therapy and meeting their goals.  Incision was healing well.  Patient was seen in rounds and was ready to go home.   Diet: Cardiac diet Activity:WBAT Follow-up:in 2 weeks Disposition - Home Discharged Condition: stable   Discharge Instructions    Call MD / Call 911    Complete by:  As directed    If you experience chest pain or shortness of breath, CALL 911 and be transported to the hospital emergency room.  If you develope a fever above 101 F, pus (white drainage) or increased drainage or redness at the wound, or calf pain, call your surgeon's office.   Constipation Prevention    Complete by:  As directed    Drink plenty of fluids.  Prune juice may be helpful.  You may use a stool softener, such as Colace (over the counter) 100 mg twice a day.  Use MiraLax (over the counter) for constipation as needed.   Diet - low sodium heart healthy    Complete by:  As directed    Discharge instructions    Complete by:  As directed    Do not remove the dressing unless is becomes saturated or compromised. If need be, replace dressing daily  with gauze and paper tape.  Shower only, no tub bath. Follow up in office in 2 weeks Call if any temperatures greater than 101 or any wound complications: [phone] during the day and ask for Dr. Charlestine Night nurse, Brunilda Payor.   Increase activity slowly as tolerated    Complete by:  As directed      Allergies as of 10/20/2016      Reactions   Codeine Nausea And Vomiting   Macrodantin    CAUSES GERD      Medication List    TAKE these medications   acetaminophen 500 MG tablet Commonly known as:  TYLENOL Take 1,000 mg by mouth every 8 (eight) hours as needed for mild pain or headache.   ALPRAZolam 0.25 MG tablet Commonly known as:  XANAX TAKE 1 TABLET BY MOUTH AT BEDTIME AS NEEDED What changed:  See the new instructions.   ALPRAZolam 0.5 MG tablet Commonly known as:  XANAX Take 0.5 mg by mouth at bedtime as needed for sleep. What changed:  Another medication with the same name was changed. Make sure you understand how and when to take each.   atorvastatin 20 MG tablet Commonly known as:  LIPITOR Take 20 mg by mouth daily.   B-12 2500 MCG Tabs Take 2,500 mcg by mouth daily.   Fish Oil 1000 MG Caps Take 1,000 mg by mouth daily.   FLUoxetine 20 MG tablet Commonly known as:  PROZAC Take 20 mg by mouth daily.   HYDROmorphone 2 MG tablet Commonly known as:  DILAUDID Take 1 tablet (2 mg total) by mouth every 4 (four) hours as needed  for moderate pain.   Magnesium Oxide 250 MG Tabs Take by mouth.   methocarbamol 500 MG tablet Commonly known as:  ROBAXIN Take 1 tablet (500 mg total) by mouth every 6 (six) hours as needed for muscle spasms.   naproxen sodium 220 MG tablet Commonly known as:  ANAPROX Take 220 mg by mouth 2 (two) times daily as needed (pain).   traMADol 50 MG tablet Commonly known as:  ULTRAM Take 50 mg by mouth daily as needed for severe pain.   VITAMIN D3 PO Take 1 tablet by mouth daily.   ZYRTEC PO Take 1 tablet by mouth daily.              Discharge Care Instructions        Start     Ordered   10/19/16 0000  HYDROmorphone (DILAUDID) 2 MG tablet  Every 4 hours PRN     10/19/16 2140   10/19/16 0000  methocarbamol (ROBAXIN) 500 MG tablet  Every 6 hours PRN     10/19/16 2140   10/19/16 0000  Call MD / Call 911    Comments:  If you experience chest pain or shortness of breath, CALL 911 and be transported to the hospital emergency room.  If you develope a fever above 101 F, pus (white drainage) or increased drainage or redness at the wound, or calf pain, call your surgeon's office.   10/19/16 2140   10/19/16 0000  Diet - low sodium heart healthy     10/19/16 2140   10/19/16 0000  Constipation Prevention    Comments:  Drink plenty of fluids.  Prune juice may be helpful.  You may use a stool softener, such as Colace (over the counter) 100 mg twice a day.  Use MiraLax (over the counter) for constipation as needed.   10/19/16 2140   10/19/16 0000  Increase activity slowly as tolerated     10/19/16 2140   10/19/16 0000  Discharge instructions    Comments:  Do not remove the dressing unless is becomes saturated or compromised. If need be, replace dressing daily with gauze and paper tape.  Shower only, no tub bath. Follow up in office in 2 weeks Call if any temperatures greater than 101 or any wound complications: [phone] during the day and ask for Dr. Charlestine Night nurse, Brunilda Payor.   10/19/16 2140     Follow-up Information    Latanya Maudlin, MD. Schedule an appointment as soon as possible for a visit in 2 week(s).   Specialty:  Orthopedic Surgery Contact information: [address] [place] [address] [phone]           Signed: Ardeen Jourdain, PA-C Orthopaedic Surgery 10/25/2016, 1:12 PM

## 2016-10-25 NOTE — H&P (Signed)
Pam [name] DOB: 24-Mar-1945 Married / Language: Cleophus Molt / Race: White Female Date of admission: November 14, 2016  Chief complaint: Right knee pain History of Present Illness The patient is a 71 year old female who comes in for a preoperative History and Physical. The patient is scheduled for a right total knee arthroplasty to be performed by Dr. Dione Plover. Aluisio, MD at [facility] on 11-14-2016. The patient is a 71 year old female who presented with knee complaints. The patient reports right knee symptoms including: pain, swelling (right lower leg), instability, catching, giving way, soreness and grinding which began year(s) ago without any known injury. The patient describes their pain as aching and burning (right thigh). The patient feels that the symptoms are worsening. The patient has the current diagnosis of knee osteoarthritis. Past treatment for this problem has included intra-articular injection of corticosteroids (Euflexxa 2017: did not help) and nonsteroidal anti-inflammatory drugs (dosepak 2017: helped some). Symptoms are reported to be located in the right knee and right lateral knee and include knee pain. The patient reports that symptoms radiate to the right thigh and right lower leg. Symptoms are exacerbated by motion at the knee and weight bearing. Symptoms are relieved by rest. Current treatment includes application of ice and nonsteroidal anti-inflammatory drugs (Aleve). Note for "Knee pain": HX right knee scope 2016 Dr. Gladstone Lighter She states that her right knee has gotten progressively worse over time. The injections have not helped her at all. She used to be extremely active but the knee is preventing her from doing a lot of things she desires. She would like to be able to get back in dance and do other recreational activities that she is no longer able to do. She also has significant pain. She would like to porceed with surgery at this time. They have been treated  conservatively in the past for the above stated problem and despite conservative measures, they continue to have progressive pain and severe functional limitations and dysfunction. They have failed non-operative management including home exercise, medications, and injections. It is felt that they would benefit from undergoing total joint replacement. Risks and benefits of the procedure have been discussed with the patient and they elect to proceed with surgery. There are no active contraindications to surgery such as ongoing infection or rapidly progressive neurological disease.   Problem List/Past Medical Toe fracture, right (S92.911A)  Pain in joint involving right ankle and foot (M25.571)  Encounter for care following Arthroscopic Partial Lateral Menisectomy (Z47.89)  Primary osteoarthritis of right knee (M17.11)  Pain of right hip joint (M25.551)  Lateral epicondylitis of right elbow (M77.11)  Acute pain of left shoulder (M25.512)  Left leg pain (M79.605)  Lumbar back pain with radiculopathy affecting left lower extremity (M54.16)  Hypercholesterolemia  Menopause  Acute pain of right knee (M25.561)  Plantar fasciitis, left (M72.2)  Spondylosis of lumbar region without myelopathy or radiculopathy (M47.816)  Acute left-sided low back pain with left-sided sciatica (M54.42)  Displaced spiral fracture of shaft of right tibia, subsequent encounter for closed fracture with routine healing (S82.241D)  Trochanteric bursitis of right hip (M70.61)    Allergies / Intolerances CODEINE [12/01/2008]: Nausea. NARCOTICS [04/30/1997]: Nausea. HYDROcodone Bitartrate *CHEMICALS*  Nausea.  Family History Family history unknown - Adopted  Liver Disease, Chronic  Sister. First Degree Relatives   Social History Tobacco use  Never smoker. 10/16/2013 never smoker No history of drug/alcohol rehab  Exercise  Exercises daily; does running / walking and gym / weights Living  situation  live with spouse Not under pain contract  Children  3 Marital status  married Current work status  retired Current drinker  10/16/2013: Currently drinks wine only occasionally per week Drug/Alcohol Rehab (Previously)  no Tobacco / smoke exposure  10/16/2013: no no Drug/Alcohol Rehab (Currently)  no Number of flights of stairs before winded  greater than 5 Pain Contract  no Alcohol use  current drinker; drinks wine Illicit drug use  no Post-Surgical Plans  Home With Family, Home with HHPT.  Medication History One a day vitamin Active. Fish Oil Active. Over the counter eye drop Active. ZyrTEC Allergy (10MG  Tablet, Oral as needed) Active. Atorvastatin Calcium (20MG  Tablet, Oral) Active. FLUoxetine HCl (20MG  Capsule, Oral daily) Active. B12 Active. B3-500-GR (500MG  Tablet ER, Oral) Active. Calcium Active. ALPRAZolam (0.25MG  Tablet, Oral as needed) Active.   Past Surgical History Cataract Surgery  left Hemorrhoidectomy  Tubal Ligation  Anal Fissure Repair  Arthroscopic Knee Surgery - Right [09/03/2014]:    Review of Systems  General Not Present- Chills, Fatigue, Fever, Memory Loss, Night Sweats, Weight Gain and Weight Loss. Skin Not Present- Eczema, Hives, Itching, Lesions and Rash. HEENT Not Present- Dentures, Double Vision, Headache, Hearing Loss, Tinnitus and Visual Loss. Respiratory Not Present- Allergies, Chronic Cough, Coughing up blood, Shortness of breath at rest and Shortness of breath with exertion. Cardiovascular Not Present- Chest Pain, Difficulty Breathing Lying Down, Murmur, Palpitations, Racing/skipping heartbeats and Swelling. Gastrointestinal Not Present- Abdominal Pain, Bloody Stool, Constipation, Diarrhea, Difficulty Swallowing, Heartburn, Jaundice, Loss of appetitie, Nausea and Vomiting. Female Genitourinary Not Present- Blood in Urine, Discharge, Flank Pain, Incontinence, Painful Urination, Urgency, Urinary  frequency, Urinary Retention, Urinating at Night and Weak urinary stream. Musculoskeletal Not Present- Back Pain, Joint Pain, Joint Swelling, Morning Stiffness, Muscle Pain, Muscle Weakness and Spasms. Neurological Not Present- Blackout spells, Difficulty with balance, Dizziness, Paralysis, Tremor and Weakness. Psychiatric Not Present- Insomnia.  Vitals Weight: 145 lb Height: 61in Weight was reported by patient. Height was reported by patient. Body Surface Area: 1.65 m Body Mass Index: 27.4 kg/m  Pulse: 88 (Regular)  BP: 108/60 (Sitting, Right Arm, Standard)       Physical Exam General Mental Status -Alert, cooperative and good historian. General Appearance-pleasant, Not in acute distress. Orientation-Oriented X3. Build & Nutrition-Well nourished and Well developed. Note: short stature  Head and Neck Head-normocephalic, atraumatic . Neck Global Assessment - supple, no bruit auscultated on the right, no bruit auscultated on the left.  Eye Pupil - Bilateral-Regular and Round. Motion - Bilateral-EOMI.  ENMT Note: bilateral heraing aids   Chest and Lung Exam Auscultation Breath sounds - clear at anterior chest wall and clear at posterior chest wall. Adventitious sounds - No Adventitious sounds.  Cardiovascular Auscultation Rhythm - Regular rate and rhythm. Heart Sounds - S1 WNL and S2 WNL. Murmurs & Other Heart Sounds - Auscultation of the heart reveals - No Murmurs.  Abdomen Palpation/Percussion Tenderness - Abdomen is non-tender to palpation. Rigidity (guarding) - Abdomen is soft. Auscultation Auscultation of the abdomen reveals - Bowel sounds normal.  Female Genitourinary Note: Not done, not pertinent to present illness   Musculoskeletal Note: Evaluation of her hips shows normal range of motion with no discomfort. Her left knee shows no effusion with normal alignment. Her range of motion is 0 to 135 on the left. There is no  tenderness or instability. Her right knee shows a valgus deformity. Range about 5 to 120. Moderate crepitus on range of motion. Tenderness lateral greater than medial with no instability.  RADIOGRAPHS AP of both knees and lateral of the right show that she has bone-on-bone arthritis, lateral and patellofemoral in the right knee.   Assessment & Plan Primary osteoarthritis of right knee (M17.11)  Note:Surgical Plans: Right Total Knee Replacement  Disposition: Home with husband, HHPT  PCP: Dr. Harrington Challenger  IV TXA  Anesthesia Issues: some nausea  Patient was instructed on what medications to stop prior to surgery.  Signed electronically by Joelene Millin, III PA-C

## 2016-10-26 DIAGNOSIS — M25569 Pain in unspecified knee: Secondary | ICD-10-CM | POA: Diagnosis not present

## 2016-10-26 DIAGNOSIS — Z01818 Encounter for other preprocedural examination: Secondary | ICD-10-CM | POA: Diagnosis not present

## 2016-11-03 ENCOUNTER — Other Ambulatory Visit (HOSPITAL_COMMUNITY): Payer: Self-pay | Admitting: *Deleted

## 2016-11-03 ENCOUNTER — Other Ambulatory Visit (HOSPITAL_COMMUNITY): Payer: Self-pay | Admitting: Pharmacy Technician

## 2016-11-04 ENCOUNTER — Other Ambulatory Visit (HOSPITAL_COMMUNITY): Payer: Self-pay | Admitting: Pharmacy Technician

## 2016-11-07 ENCOUNTER — Encounter (HOSPITAL_COMMUNITY)
Admission: RE | Admit: 2016-11-07 | Discharge: 2016-11-07 | Disposition: A | Discharge status: Home / Self Care | Payer: Medicare HMO | Source: Ambulatory Visit | Attending: Orthopedic Surgery | Admitting: Orthopedic Surgery

## 2016-11-07 ENCOUNTER — Encounter (HOSPITAL_COMMUNITY): Payer: Self-pay

## 2016-11-07 DIAGNOSIS — I10 Essential (primary) hypertension: Secondary | ICD-10-CM | POA: Diagnosis not present

## 2016-11-07 DIAGNOSIS — M1711 Unilateral primary osteoarthritis, right knee: Secondary | ICD-10-CM | POA: Diagnosis not present

## 2016-11-07 DIAGNOSIS — Z01818 Encounter for other preprocedural examination: Secondary | ICD-10-CM | POA: Diagnosis not present

## 2016-11-07 LAB — COMPREHENSIVE METABOLIC PANEL
ALK PHOS: 103 U/L (ref 38–126)
ALT: 23 U/L (ref 14–54)
ANION GAP: 9 (ref 5–15)
AST: 27 U/L (ref 15–41)
Albumin: 4.1 g/dL (ref 3.5–5.0)
BILIRUBIN TOTAL: 0.4 mg/dL (ref 0.3–1.2)
BUN: 15 mg/dL (ref 6–20)
CALCIUM: 9.6 mg/dL (ref 8.9–10.3)
CO2: 26 mmol/L (ref 22–32)
Chloride: 106 mmol/L (ref 101–111)
Creatinine, Ser: 0.65 mg/dL (ref 0.44–1.00)
GFR calc non Af Amer: >60 mL/min (ref >60)
Glucose, Bld: 98 mg/dL (ref 65–99)
Potassium: 4.5 mmol/L (ref 3.5–5.1)
Sodium: 141 mmol/L (ref 135–145)
TOTAL PROTEIN: 6.8 g/dL (ref 6.5–8.1)

## 2016-11-07 LAB — CBC
HEMATOCRIT: 40.1 % (ref 36.0–46.0)
HEMOGLOBIN: 13.5 g/dL (ref 12.0–15.0)
MCH: 31.6 pg (ref 26.0–34.0)
MCHC: 33.7 g/dL (ref 30.0–36.0)
MCV: 93.9 fL (ref 78.0–100.0)
Platelets: 280 10*3/uL (ref 150–400)
RBC: 4.27 MIL/uL (ref 3.87–5.11)
RDW: 14 % (ref 11.5–15.5)
WBC: 4.9 10*3/uL (ref 4.0–10.5)

## 2016-11-07 LAB — SURGICAL PCR SCREEN
MRSA, PCR: NEGATIVE
Staphylococcus aureus: POSITIVE — AB

## 2016-11-07 LAB — PROTIME-INR
INR: 0.89
Prothrombin Time: 12 seconds (ref 11.4–15.2)

## 2016-11-07 LAB — ABO/RH: ABO/RH(D): B POS

## 2016-11-07 LAB — APTT: aPTT: 26 seconds (ref 24–36)

## 2016-11-07 NOTE — Progress Notes (Addendum)
  09-05-16 ECHO on chart from Dr. Wynonia Lawman

## 2016-11-07 NOTE — Patient Instructions (Addendum)
Pam [name]  11/07/2016   Your procedure is scheduled on: 11-14-16   Report to [facility] Main  Entrance Follow signs to Short Stay on first floor at 5:15 AM    Call this number if you have problems the morning of surgery [phone]   Remember: ONLY 1 PERSON MAY GO WITH YOU TO SHORT STAY TO GET  READY MORNING OF Pam [name].  Do not eat food or drink liquids :After Midnight.     Take these medicines the morning of surgery with A SIP OF WATER: Fluoxetine (Prozac), and Atorvastatin (Lipitor)                                You may not have any metal on your body including hair pins and              piercings  Do not wear jewelry, make-up, lotions, powders or perfumes, deodorant             Do not wear nail polish.  Do not shave  48 hours prior to surgery.                 Do not bring valuables to the hospital. [name]  Contacts, dentures or bridgework may not be worn into surgery.  Leave suitcase in the car. After surgery it may be brought to your room.                  Please read over the following fact sheets you were given: _____________________________________________________________________             Pam [name] - Preparing for Surgery Before surgery, you can play an important role.  Because skin is not sterile, your skin needs to be as free of germs as possible.  You can reduce the number of germs on your skin by washing with CHG (chlorahexidine gluconate) soap before surgery.  CHG is an antiseptic cleaner which kills germs and bonds with the skin to continue killing germs even after washing. Please DO NOT use if you have an allergy to CHG or antibacterial soaps.  If your skin becomes reddened/irritated stop using the CHG and inform your nurse when you arrive at Short Stay. Do not shave (including legs and underarms) for at least 48 hours prior to the first CHG shower.  You may shave your  face/neck. Please follow these instructions carefully:  1.  Shower with CHG Soap the night before surgery and the  morning of Surgery.  2.  If you choose to wash your hair, wash your hair first as usual with your  normal  shampoo.  3.  After you shampoo, rinse your hair and body thoroughly to remove the  shampoo.                           4.  Use CHG as you would any other liquid soap.  You can apply chg directly  to the skin and wash                       Gently with a scrungie or clean washcloth.  5.  Apply the CHG Soap to  your body ONLY FROM THE NECK DOWN.   Do not use on face/ open                           Wound or open sores. Avoid contact with eyes, ears mouth and genitals (private parts).                       Wash face,  Genitals (private parts) with your normal soap.             6.  Wash thoroughly, paying special attention to the area where your surgery  will be performed.  7.  Thoroughly rinse your body with warm water from the neck down.  8.  DO NOT shower/wash with your normal soap after using and rinsing off  the CHG Soap.                9.  Pat yourself dry with a clean towel.            10.  Wear clean pajamas.            11.  Place clean sheets on your bed the night of your first shower and do not  sleep with pets. Day of Surgery : Do not apply any lotions/deodorants the morning of surgery.  Please wear clean clothes to the hospital/surgery [name].  FAILURE TO FOLLOW THESE INSTRUCTIONS MAY RESULT IN THE CANCELLATION OF YOUR SURGERY PATIENT SIGNATURE_________________________________  NURSE SIGNATURE__________________________________  ________________________________________________________________________   [name]  An incentive spirometer is a tool that can help keep your lungs clear and active. This tool measures how well you are filling your lungs with each breath. Taking long deep breaths may help reverse or decrease the chance of developing breathing  (pulmonary) problems (especially infection) following:  A long period of time when you are unable to move or be active. BEFORE THE PROCEDURE   If the spirometer includes an indicator to show your best effort, your nurse or respiratory therapist will set it to a desired goal.  If possible, sit up straight or lean slightly forward. Try not to slouch.  Hold the incentive spirometer in an upright position. INSTRUCTIONS FOR USE  1. Sit on the edge of your bed if possible, or sit up as far as you can in bed or on a chair. 2. Hold the incentive spirometer in an upright position. 3. Breathe out normally. 4. Place the mouthpiece in your mouth and seal your lips tightly around it. 5. Breathe in slowly and as deeply as possible, raising the piston or the ball toward the top of the column. 6. Hold your breath for 3-5 seconds or for as long as possible. Allow the piston or ball to fall to the bottom of the column. 7. Remove the mouthpiece from your mouth and breathe out normally. 8. Rest for a few seconds and repeat Steps 1 through 7 at least 10 times every 1-2 hours when you are awake. Take your time and take a few normal breaths between deep breaths. 9. The spirometer may include an indicator to show your best effort. Use the indicator as a goal to work toward during each repetition. 10. After each set of 10 deep breaths, practice coughing to be sure your lungs are clear. If you have an incision (the cut made at the time of surgery), support your incision when coughing by placing a pillow or rolled up towels firmly against  it. Once you are able to get out of bed, walk around indoors and cough well. You may stop using the incentive spirometer when instructed by your caregiver.  RISKS AND COMPLICATIONS  Take your time so you do not get dizzy or light-headed.  If you are in pain, you may need to take or ask for pain medication before doing incentive spirometry. It is harder to take a deep breath if you  are having pain. AFTER USE  Rest and breathe slowly and easily.  It can be helpful to keep track of a log of your progress. Your caregiver can provide you with a simple table to help with this. If you are using the spirometer at home, follow these instructions: [place] IF:   You are having difficultly using the spirometer.  You have trouble using the spirometer as often as instructed.  Your pain medication is not giving enough relief while using the spirometer.  You develop fever of 100.5 F (38.1 C) or higher. SEEK IMMEDIATE MEDICAL CARE IF:   You cough up bloody sputum that had not been present before.  You develop fever of 102 F (38.9 C) or greater.  You develop worsening pain at or near the incision site. MAKE SURE YOU:   Understand these instructions.  Will watch your condition.  Will get help right away if you are not doing well or get worse. Document Released: 06/20/2006 Document Revised: 05/02/2011 Document Reviewed: 08/21/2006 ExitCare Patient Information 2014 Exit[employer], [place].   ________________________________________________________________________  WHAT IS A BLOOD TRANSFUSION? Blood Transfusion Information  A transfusion is the replacement of blood or some of its parts. Blood is made up of multiple cells which provide different functions.  Red blood cells carry oxygen and are used for blood loss replacement.  White blood cells fight against infection.  Platelets control bleeding.  Plasma helps clot blood.  Other blood products are available for specialized needs, such as hemophilia or other clotting disorders. BEFORE THE TRANSFUSION  Who gives blood for transfusions?   Healthy volunteers who are fully evaluated to make sure their blood is safe. This is blood bank blood. Transfusion therapy is the safest it has ever been in the practice of medicine. Before blood is taken from a donor, a complete history is taken to make sure that person has  no history of diseases nor engages in risky social behavior (examples are intravenous drug use or sexual activity with multiple partners). The donor's travel history is screened to minimize risk of transmitting infections, such as malaria. The donated blood is tested for signs of infectious diseases, such as HIV and hepatitis. The blood is then tested to be sure it is compatible with you in order to minimize the chance of a transfusion reaction. If you or a relative donates blood, this is often done in anticipation of surgery and is not appropriate for emergency situations. It takes many days to process the donated blood. RISKS AND COMPLICATIONS Although transfusion therapy is very safe and saves many lives, the main dangers of transfusion include:   Getting an infectious disease.  Developing a transfusion reaction. This is an allergic reaction to something in the blood you were given. Every precaution is taken to prevent this. The decision to have a blood transfusion has been considered carefully by your caregiver before blood is given. Blood is not given unless the benefits outweigh the risks. AFTER THE TRANSFUSION  Right after receiving a blood transfusion, you will usually feel much better and more  energetic. This is especially true if your red blood cells have gotten low (anemic). The transfusion raises the level of the red blood cells which carry oxygen, and this usually causes an energy increase.  The nurse administering the transfusion will monitor you carefully for complications. HOME CARE INSTRUCTIONS  No special instructions are needed after a transfusion. You may find your energy is better. Speak with your caregiver about any limitations on activity for underlying diseases you may have. SEEK MEDICAL CARE IF:   Your condition is not improving after your transfusion.  You develop redness or irritation at the intravenous (IV) site. SEEK IMMEDIATE MEDICAL CARE IF:  Any of the following  symptoms occur over the next 12 hours:  Shaking chills.  You have a temperature by mouth above 102 F (38.9 C), not controlled by medicine.  Chest, back, or muscle pain.  People around you feel you are not acting correctly or are confused.  Shortness of breath or difficulty breathing.  Dizziness and fainting.  You get a rash or develop hives.  You have a decrease in urine output.  Your urine turns a dark color or changes to pink, red, or brown. Any of the following symptoms occur over the next 10 days:  You have a temperature by mouth above 102 F (38.9 C), not controlled by medicine.  Shortness of breath.  Weakness after normal activity.  The white part of the eye turns yellow (jaundice).  You have a decrease in the amount of urine or are urinating less often.  Your urine turns a dark color or changes to pink, red, or brown. Document Released: 02/05/2000 Document Revised: 05/02/2011 Document Reviewed: 09/24/2007 [name] Patient Information 2014 [place], [place].  _______________________________________________________________________

## 2016-11-08 NOTE — Progress Notes (Signed)
11-07-16 PCR results, routed to Dr. Wynelle Link for review.

## 2016-11-13 NOTE — Anesthesia Preprocedure Evaluation (Addendum)
Anesthesia Evaluation  Patient identified by MRN, date of birth, ID band Patient awake    Reviewed: Allergy & Precautions, NPO status , Patient's Chart, lab work & pertinent test results  History of Anesthesia Complications (+) PONV  Airway Mallampati: II  TM Distance: >3 FB Neck ROM: Full    Dental  (+) Dental Advisory Given   Pulmonary neg pulmonary ROS,    Pulmonary exam normal breath sounds clear to auscultation       Cardiovascular negative cardio ROS Normal cardiovascular exam Rhythm:Regular Rate:Normal     Neuro/Psych Anxiety negative neurological ROS     GI/Hepatic Neg liver ROS, GERD  Controlled,  Endo/Other  negative endocrine ROS  Renal/GU negative Renal ROS  negative genitourinary   Musculoskeletal  (+) Arthritis ,   Abdominal   Peds negative pediatric ROS (+)  Hematology negative hematology ROS (+)   Anesthesia Other Findings   Reproductive/Obstetrics negative OB ROS                            Anesthesia Physical  Anesthesia Plan  ASA: II  Anesthesia Plan: Spinal   Post-op Pain Management:  Regional for Post-op pain   Induction: Intravenous  PONV Risk Score and Plan: 4 or greater and Propofol infusion, Treatment may vary due to age or medical condition and Ondansetron  Airway Management Planned: Natural Airway and Nasal Cannula  Additional Equipment: None  Intra-op Plan:   Post-operative Plan:   Informed Consent: I have reviewed the patients History and Physical, chart, labs and discussed the procedure including the risks, benefits and alternatives for the proposed anesthesia with the patient or authorized representative who has indicated his/her understanding and acceptance.   Dental advisory given  Plan Discussed with: CRNA  Anesthesia Plan Comments:         Anesthesia Quick Evaluation

## 2016-11-14 ENCOUNTER — Inpatient Hospital Stay (HOSPITAL_COMMUNITY): Payer: Medicare HMO | Admitting: Anesthesiology

## 2016-11-14 ENCOUNTER — Inpatient Hospital Stay (HOSPITAL_COMMUNITY)
Admission: RE | Admit: 2016-11-14 | Discharge: 2016-11-16 | DRG: 470 | Disposition: A | Discharge status: Home Health | Payer: Medicare HMO | Source: Ambulatory Visit | Attending: Orthopedic Surgery | Admitting: Orthopedic Surgery

## 2016-11-14 ENCOUNTER — Encounter (HOSPITAL_COMMUNITY): Payer: Self-pay | Admitting: Certified Registered Nurse Anesthetist

## 2016-11-14 ENCOUNTER — Encounter (HOSPITAL_COMMUNITY)
Admission: RE | Disposition: A | Discharge status: Home Health | Payer: Self-pay | Source: Ambulatory Visit | Attending: Orthopedic Surgery

## 2016-11-14 DIAGNOSIS — M1711 Unilateral primary osteoarthritis, right knee: Principal | ICD-10-CM | POA: Diagnosis present

## 2016-11-14 DIAGNOSIS — Z885 Allergy status to narcotic agent status: Secondary | ICD-10-CM | POA: Diagnosis not present

## 2016-11-14 DIAGNOSIS — F419 Anxiety disorder, unspecified: Secondary | ICD-10-CM | POA: Diagnosis not present

## 2016-11-14 DIAGNOSIS — M4726 Other spondylosis with radiculopathy, lumbar region: Secondary | ICD-10-CM | POA: Diagnosis present

## 2016-11-14 DIAGNOSIS — M1710 Unilateral primary osteoarthritis, unspecified knee: Secondary | ICD-10-CM

## 2016-11-14 DIAGNOSIS — Z9189 Other specified personal risk factors, not elsewhere classified: Secondary | ICD-10-CM

## 2016-11-14 DIAGNOSIS — M5430 Sciatica, unspecified side: Secondary | ICD-10-CM | POA: Diagnosis not present

## 2016-11-14 DIAGNOSIS — E7800 Pure hypercholesterolemia, unspecified: Secondary | ICD-10-CM | POA: Diagnosis not present

## 2016-11-14 DIAGNOSIS — Z79899 Other long term (current) drug therapy: Secondary | ICD-10-CM | POA: Diagnosis not present

## 2016-11-14 DIAGNOSIS — Z881 Allergy status to other antibiotic agents status: Secondary | ICD-10-CM

## 2016-11-14 DIAGNOSIS — M7061 Trochanteric bursitis, right hip: Secondary | ICD-10-CM | POA: Diagnosis present

## 2016-11-14 DIAGNOSIS — G8918 Other acute postprocedural pain: Secondary | ICD-10-CM | POA: Diagnosis not present

## 2016-11-14 DIAGNOSIS — M8580 Other specified disorders of bone density and structure, unspecified site: Secondary | ICD-10-CM | POA: Diagnosis present

## 2016-11-14 DIAGNOSIS — K219 Gastro-esophageal reflux disease without esophagitis: Secondary | ICD-10-CM | POA: Diagnosis not present

## 2016-11-14 DIAGNOSIS — M179 Osteoarthritis of knee, unspecified: Secondary | ICD-10-CM | POA: Diagnosis present

## 2016-11-14 DIAGNOSIS — R269 Unspecified abnormalities of gait and mobility: Secondary | ICD-10-CM | POA: Diagnosis not present

## 2016-11-14 HISTORY — PX: TOTAL KNEE ARTHROPLASTY: SHX125

## 2016-11-14 LAB — TYPE AND SCREEN
ABO/RH(D): B POS
Antibody Screen: NEGATIVE

## 2016-11-14 SURGERY — ARTHROPLASTY, KNEE, TOTAL
Anesthesia: Spinal | Site: Knee | Laterality: Right

## 2016-11-14 MED ORDER — SODIUM CHLORIDE 0.9 % IJ SOLN
INTRAMUSCULAR | Status: AC
  Filled 2016-11-14: qty 50

## 2016-11-14 MED ORDER — DIPHENHYDRAMINE HCL 12.5 MG/5ML PO ELIX: 12.5000 mg | ORAL_SOLUTION | ORAL | Status: DC | PRN

## 2016-11-14 MED ORDER — SODIUM CHLORIDE 0.9 % IV SOLN
1000.0000 mg | Freq: Once | INTRAVENOUS | Status: AC
  Administered 2016-11-14: 1000 mg via INTRAVENOUS
  Filled 2016-11-14: qty 1100

## 2016-11-14 MED ORDER — CHLORHEXIDINE GLUCONATE 4 % EX LIQD: 60.0000 mL | Freq: Once | CUTANEOUS | Status: DC

## 2016-11-14 MED ORDER — ACETAMINOPHEN 500 MG PO TABS
1000.0000 mg | ORAL_TABLET | Freq: Four times a day (QID) | ORAL | Status: AC
Start: 2016-11-14 — End: 2016-11-15
  Administered 2016-11-14 – 2016-11-15 (×4): 1000 mg via ORAL
  Filled 2016-11-14 (×4): qty 2

## 2016-11-14 MED ORDER — SCOPOLAMINE 1 MG/3DAYS TD PT72
MEDICATED_PATCH | TRANSDERMAL | Status: AC
  Filled 2016-11-14: qty 1

## 2016-11-14 MED ORDER — BISACODYL 10 MG RE SUPP: 10.0000 mg | Freq: Every day | RECTAL | Status: DC | PRN

## 2016-11-14 MED ORDER — FENTANYL CITRATE (PF) 100 MCG/2ML IJ SOLN
INTRAMUSCULAR | Status: AC
  Filled 2016-11-14: qty 2

## 2016-11-14 MED ORDER — METHOCARBAMOL 500 MG PO TABS
500.0000 mg | ORAL_TABLET | Freq: Four times a day (QID) | ORAL | Status: DC | PRN
  Administered 2016-11-14 – 2016-11-16 (×6): 500 mg via ORAL
  Filled 2016-11-14 (×6): qty 1

## 2016-11-14 MED ORDER — CEFAZOLIN SODIUM-DEXTROSE 2-4 GM/100ML-% IV SOLN
2.0000 g | Freq: Four times a day (QID) | INTRAVENOUS | Status: AC
  Administered 2016-11-14 (×2): 2 g via INTRAVENOUS
  Filled 2016-11-14 (×2): qty 100

## 2016-11-14 MED ORDER — FENTANYL CITRATE (PF) 100 MCG/2ML IJ SOLN: 25.0000 ug | INTRAMUSCULAR | Status: DC | PRN

## 2016-11-14 MED ORDER — METOCLOPRAMIDE HCL 5 MG PO TABS: 5.0000 mg | ORAL_TABLET | Freq: Three times a day (TID) | ORAL | Status: DC | PRN

## 2016-11-14 MED ORDER — RIVAROXABAN 10 MG PO TABS
10.0000 mg | ORAL_TABLET | Freq: Every day | ORAL | Status: DC
  Administered 2016-11-15 – 2016-11-16 (×2): 10 mg via ORAL
  Filled 2016-11-14 (×2): qty 1

## 2016-11-14 MED ORDER — FLEET ENEMA 7-19 GM/118ML RE ENEM: 1.0000 | ENEMA | Freq: Once | RECTAL | Status: DC | PRN

## 2016-11-14 MED ORDER — TRANEXAMIC ACID 1000 MG/10ML IV SOLN
1000.0000 mg | INTRAVENOUS | Status: AC
  Administered 2016-11-14: 1000 mg via INTRAVENOUS
  Filled 2016-11-14: qty 10

## 2016-11-14 MED ORDER — PHENYLEPHRINE 40 MCG/ML (10ML) SYRINGE FOR IV PUSH (FOR BLOOD PRESSURE SUPPORT)
PREFILLED_SYRINGE | INTRAVENOUS | Status: AC
  Filled 2016-11-14: qty 10

## 2016-11-14 MED ORDER — MENTHOL 3 MG MT LOZG: 1.0000 | LOZENGE | OROMUCOSAL | Status: DC | PRN

## 2016-11-14 MED ORDER — ACETAMINOPHEN 325 MG PO TABS: 650.0000 mg | ORAL_TABLET | Freq: Four times a day (QID) | ORAL | Status: DC | PRN

## 2016-11-14 MED ORDER — ONDANSETRON HCL 4 MG/2ML IJ SOLN
INTRAMUSCULAR | Status: DC | PRN
  Administered 2016-11-14: 4 mg via INTRAVENOUS

## 2016-11-14 MED ORDER — POLYETHYLENE GLYCOL 3350 17 G PO PACK: 17.0000 g | PACK | Freq: Every day | ORAL | Status: DC | PRN

## 2016-11-14 MED ORDER — DOCUSATE SODIUM 100 MG PO CAPS
100.0000 mg | ORAL_CAPSULE | Freq: Two times a day (BID) | ORAL | Status: DC
  Administered 2016-11-14 – 2016-11-16 (×4): 100 mg via ORAL
  Filled 2016-11-14 (×4): qty 1

## 2016-11-14 MED ORDER — GABAPENTIN 300 MG PO CAPS
ORAL_CAPSULE | ORAL | Status: AC
  Filled 2016-11-14: qty 1

## 2016-11-14 MED ORDER — PROPOFOL 10 MG/ML IV BOLUS
INTRAVENOUS | Status: AC
  Filled 2016-11-14: qty 60

## 2016-11-14 MED ORDER — BUPIVACAINE-EPINEPHRINE (PF) 0.5% -1:200000 IJ SOLN
INTRAMUSCULAR | Status: DC | PRN
  Administered 2016-11-14: 30 mL via PERINEURAL

## 2016-11-14 MED ORDER — METOCLOPRAMIDE HCL 5 MG/ML IJ SOLN: 5.0000 mg | Freq: Three times a day (TID) | INTRAMUSCULAR | Status: DC | PRN

## 2016-11-14 MED ORDER — DEXAMETHASONE SODIUM PHOSPHATE 10 MG/ML IJ SOLN
10.0000 mg | Freq: Once | INTRAMUSCULAR | Status: AC
  Administered 2016-11-14: 10 mg via INTRAVENOUS

## 2016-11-14 MED ORDER — FLUOXETINE HCL 20 MG PO TABS
40.0000 mg | ORAL_TABLET | Freq: Every day | ORAL | Status: DC
  Filled 2016-11-14: qty 2

## 2016-11-14 MED ORDER — FENTANYL CITRATE (PF) 100 MCG/2ML IJ SOLN
INTRAMUSCULAR | Status: DC | PRN
  Administered 2016-11-14: 100 ug via INTRAVENOUS

## 2016-11-14 MED ORDER — SCOPOLAMINE 1 MG/3DAYS TD PT72
MEDICATED_PATCH | TRANSDERMAL | Status: DC | PRN
  Administered 2016-11-14: 1 via TRANSDERMAL

## 2016-11-14 MED ORDER — MORPHINE SULFATE (PF) 4 MG/ML IV SOLN
1.0000 mg | INTRAVENOUS | Status: DC | PRN
  Administered 2016-11-14 – 2016-11-15 (×6): 1 mg via INTRAVENOUS
  Filled 2016-11-14 (×6): qty 1

## 2016-11-14 MED ORDER — BUPIVACAINE LIPOSOME 1.3 % IJ SUSP
20.0000 mL | Freq: Once | INTRAMUSCULAR | Status: DC
  Filled 2016-11-14: qty 20

## 2016-11-14 MED ORDER — ONDANSETRON HCL 4 MG/2ML IJ SOLN
INTRAMUSCULAR | Status: AC
  Filled 2016-11-14: qty 2

## 2016-11-14 MED ORDER — DEXAMETHASONE SODIUM PHOSPHATE 10 MG/ML IJ SOLN: 10.0000 mg | Freq: Once | INTRAMUSCULAR | Status: DC

## 2016-11-14 MED ORDER — SODIUM CHLORIDE 0.9 % IR SOLN
Status: DC | PRN
  Administered 2016-11-14: 1000 mL

## 2016-11-14 MED ORDER — DEXAMETHASONE SODIUM PHOSPHATE 10 MG/ML IJ SOLN
INTRAMUSCULAR | Status: AC
  Filled 2016-11-14: qty 1

## 2016-11-14 MED ORDER — PROPOFOL 500 MG/50ML IV EMUL
INTRAVENOUS | Status: DC | PRN
  Administered 2016-11-14: 100 ug/kg/min via INTRAVENOUS

## 2016-11-14 MED ORDER — ACETAMINOPHEN 10 MG/ML IV SOLN
INTRAVENOUS | Status: AC
  Filled 2016-11-14: qty 100

## 2016-11-14 MED ORDER — PHENYLEPHRINE HCL 10 MG/ML IJ SOLN
INTRAMUSCULAR | Status: DC | PRN
  Administered 2016-11-14 (×2): 80 ug via INTRAVENOUS
  Administered 2016-11-14: 160 ug via INTRAVENOUS
  Administered 2016-11-14: 80 ug via INTRAVENOUS

## 2016-11-14 MED ORDER — BUPIVACAINE IN DEXTROSE 0.75-8.25 % IT SOLN
INTRATHECAL | Status: DC | PRN
  Administered 2016-11-14: 1.4 mL via INTRATHECAL

## 2016-11-14 MED ORDER — MIDAZOLAM HCL 2 MG/2ML IJ SOLN
INTRAMUSCULAR | Status: AC
  Filled 2016-11-14: qty 2

## 2016-11-14 MED ORDER — SODIUM CHLORIDE 0.9 % IV SOLN
INTRAVENOUS | Status: DC
  Administered 2016-11-14: 1000 mL via INTRAVENOUS
  Administered 2016-11-15: 01:00:00 via INTRAVENOUS

## 2016-11-14 MED ORDER — ONDANSETRON HCL 4 MG/2ML IJ SOLN: 4.0000 mg | Freq: Four times a day (QID) | INTRAMUSCULAR | Status: DC | PRN

## 2016-11-14 MED ORDER — HYDROMORPHONE HCL 2 MG PO TABS
2.0000 mg | ORAL_TABLET | ORAL | Status: DC | PRN
  Administered 2016-11-14: 2 mg via ORAL
  Administered 2016-11-14 – 2016-11-16 (×9): 4 mg via ORAL
  Filled 2016-11-14 (×7): qty 2
  Filled 2016-11-14: qty 1
  Filled 2016-11-14 (×2): qty 2

## 2016-11-14 MED ORDER — ONDANSETRON HCL 4 MG/2ML IJ SOLN: 4.0000 mg | Freq: Once | INTRAMUSCULAR | Status: DC | PRN

## 2016-11-14 MED ORDER — CEFAZOLIN SODIUM-DEXTROSE 2-4 GM/100ML-% IV SOLN
INTRAVENOUS | Status: AC
  Filled 2016-11-14: qty 100

## 2016-11-14 MED ORDER — METHOCARBAMOL 1000 MG/10ML IJ SOLN
500.0000 mg | Freq: Four times a day (QID) | INTRAVENOUS | Status: DC | PRN
  Administered 2016-11-14: 500 mg via INTRAVENOUS
  Filled 2016-11-14: qty 550

## 2016-11-14 MED ORDER — ONDANSETRON HCL 4 MG PO TABS: 4.0000 mg | ORAL_TABLET | Freq: Four times a day (QID) | ORAL | Status: DC | PRN

## 2016-11-14 MED ORDER — BUPIVACAINE LIPOSOME 1.3 % IJ SUSP
INTRAMUSCULAR | Status: DC | PRN
  Administered 2016-11-14: 20 mL

## 2016-11-14 MED ORDER — GABAPENTIN 300 MG PO CAPS
300.0000 mg | ORAL_CAPSULE | Freq: Once | ORAL | Status: AC
  Administered 2016-11-14: 300 mg via ORAL

## 2016-11-14 MED ORDER — LACTATED RINGERS IV SOLN
INTRAVENOUS | Status: DC
  Administered 2016-11-14 (×3): via INTRAVENOUS

## 2016-11-14 MED ORDER — ACETAMINOPHEN 10 MG/ML IV SOLN
1000.0000 mg | Freq: Once | INTRAVENOUS | Status: AC
  Administered 2016-11-14: 1000 mg via INTRAVENOUS

## 2016-11-14 MED ORDER — MIDAZOLAM HCL 5 MG/5ML IJ SOLN
INTRAMUSCULAR | Status: DC | PRN
  Administered 2016-11-14: 2 mg via INTRAVENOUS

## 2016-11-14 MED ORDER — PHENOL 1.4 % MT LIQD: 1.0000 | OROMUCOSAL | Status: DC | PRN

## 2016-11-14 MED ORDER — SODIUM CHLORIDE 0.9 % IJ SOLN
INTRAMUSCULAR | Status: DC | PRN
  Administered 2016-11-14: 40 mL

## 2016-11-14 MED ORDER — ATORVASTATIN CALCIUM 20 MG PO TABS
20.0000 mg | ORAL_TABLET | Freq: Every day | ORAL | Status: DC
  Administered 2016-11-15 – 2016-11-16 (×2): 20 mg via ORAL
  Filled 2016-11-14 (×2): qty 1

## 2016-11-14 MED ORDER — CEFAZOLIN SODIUM-DEXTROSE 2-4 GM/100ML-% IV SOLN
2.0000 g | INTRAVENOUS | Status: AC
  Administered 2016-11-14: 2 g via INTRAVENOUS

## 2016-11-14 MED ORDER — PROPOFOL 10 MG/ML IV BOLUS
INTRAVENOUS | Status: DC | PRN
  Administered 2016-11-14: 50 mg via INTRAVENOUS
  Administered 2016-11-14: 30 mg via INTRAVENOUS

## 2016-11-14 MED ORDER — ALPRAZOLAM 0.25 MG PO TABS
0.2500 mg | ORAL_TABLET | Freq: Every evening | ORAL | Status: DC | PRN
  Administered 2016-11-15: 0.25 mg via ORAL
  Filled 2016-11-14: qty 1

## 2016-11-14 MED ORDER — ACETAMINOPHEN 650 MG RE SUPP: 650.0000 mg | Freq: Four times a day (QID) | RECTAL | Status: DC | PRN

## 2016-11-14 SURGICAL SUPPLY — 48 items
BAG DECANTER FOR FLEXI CONT (MISCELLANEOUS) ×2 IMPLANT
BAG ZIPLOCK 12X15 (MISCELLANEOUS) ×2 IMPLANT
BANDAGE ACE 6X5 VEL STRL LF (GAUZE/BANDAGES/DRESSINGS) ×2 IMPLANT
BLADE SAG 18X100X1.27 (BLADE) ×2 IMPLANT
BLADE SAW SGTL 11.0X1.19X90.0M (BLADE) ×2 IMPLANT
BOWL SMART MIX CTS (DISPOSABLE) ×2 IMPLANT
CAPT KNEE TOTAL 3 ATTUNE ×2 IMPLANT
COVER SURGICAL LIGHT HANDLE (MISCELLANEOUS) ×2 IMPLANT
CUFF TOURN SGL QUICK 34 (TOURNIQUET CUFF) ×1
CUFF TRNQT CYL 34X4X40X1 (TOURNIQUET CUFF) ×1 IMPLANT
DECANTER SPIKE VIAL GLASS SM (MISCELLANEOUS) ×2 IMPLANT
DRAPE U-SHAPE 47X51 STRL (DRAPES) ×2 IMPLANT
DRSG ADAPTIC 3X8 NADH LF (GAUZE/BANDAGES/DRESSINGS) ×2 IMPLANT
DRSG PAD ABDOMINAL 8X10 ST (GAUZE/BANDAGES/DRESSINGS) ×2 IMPLANT
DURAPREP 26ML APPLICATOR (WOUND CARE) ×2 IMPLANT
ELECT REM PT RETURN 15FT ADLT (MISCELLANEOUS) ×2 IMPLANT
EVACUATOR 1/8 PVC DRAIN (DRAIN) ×2 IMPLANT
GAUZE SPONGE 4X4 12PLY STRL (GAUZE/BANDAGES/DRESSINGS) ×2 IMPLANT
GLOVE BIO SURGEON STRL SZ7.5 (GLOVE) IMPLANT
GLOVE BIO SURGEON STRL SZ8 (GLOVE) ×2 IMPLANT
GLOVE BIOGEL PI IND STRL 6.5 (GLOVE) IMPLANT
GLOVE BIOGEL PI IND STRL 8 (GLOVE) ×1 IMPLANT
GLOVE BIOGEL PI INDICATOR 6.5 (GLOVE)
GLOVE BIOGEL PI INDICATOR 8 (GLOVE) ×1
GLOVE SURG SS PI 6.5 STRL IVOR (GLOVE) IMPLANT
GOWN STRL REUS W/TWL LRG LVL3 (GOWN DISPOSABLE) ×2 IMPLANT
GOWN STRL REUS W/TWL XL LVL3 (GOWN DISPOSABLE) IMPLANT
HANDPIECE INTERPULSE COAX TIP (DISPOSABLE) ×1
IMMOBILIZER KNEE 20 (SOFTGOODS) ×2
IMMOBILIZER KNEE 20 THIGH 36 (SOFTGOODS) ×1 IMPLANT
MANIFOLD NEPTUNE II (INSTRUMENTS) ×2 IMPLANT
NS IRRIG 1000ML POUR BTL (IV SOLUTION) ×2 IMPLANT
PACK TOTAL KNEE CUSTOM (KITS) ×2 IMPLANT
PAD ABD 8X10 STRL (GAUZE/BANDAGES/DRESSINGS) ×2 IMPLANT
PADDING CAST COTTON 6X4 STRL (CAST SUPPLIES) ×4 IMPLANT
POSITIONER SURGICAL ARM (MISCELLANEOUS) ×2 IMPLANT
SET HNDPC FAN SPRY TIP SCT (DISPOSABLE) ×1 IMPLANT
STRIP CLOSURE SKIN 1/2X4 (GAUZE/BANDAGES/DRESSINGS) ×2 IMPLANT
SUT MNCRL AB 4-0 PS2 18 (SUTURE) ×2 IMPLANT
SUT STRATAFIX 0 PDS 27 VIOLET (SUTURE) ×2
SUT VIC AB 2-0 CT1 27 (SUTURE) ×3
SUT VIC AB 2-0 CT1 TAPERPNT 27 (SUTURE) ×3 IMPLANT
SUTURE STRATFX 0 PDS 27 VIOLET (SUTURE) ×1 IMPLANT
SYR 30ML LL (SYRINGE) ×4 IMPLANT
TRAY FOLEY W/METER SILVER 16FR (SET/KITS/TRAYS/PACK) ×2 IMPLANT
WATER STERILE IRR 1000ML POUR (IV SOLUTION) ×4 IMPLANT
WRAP KNEE MAXI GEL POST OP (GAUZE/BANDAGES/DRESSINGS) ×2 IMPLANT
YANKAUER SUCT BULB TIP 10FT TU (MISCELLANEOUS) ×2 IMPLANT

## 2016-11-14 NOTE — Anesthesia Postprocedure Evaluation (Signed)
Anesthesia Post Note  Patient: Pam [name]  Procedure(s) Performed: Procedure(s) (LRB): RIGHT TOTAL KNEE ARTHROPLASTY (Right)     Patient location during evaluation: PACU Anesthesia Type: Spinal Level of consciousness: awake and alert Pain management: pain level controlled Vital Signs Assessment: post-procedure vital signs reviewed and stable Respiratory status: spontaneous breathing and respiratory function stable Cardiovascular status: blood pressure returned to baseline and stable Postop Assessment: spinal receding and no apparent nausea or vomiting Anesthetic complications: no    Last Vitals:  Vitals:   11/14/16 0915 11/14/16 0930  BP: 119/74 121/83  Pulse: 74 68  Resp: 16 20  Temp:    SpO2: 100% 99%    Last Pain:  Vitals:   11/14/16 0915  TempSrc:   PainSc: 0-No pain    LLE Motor Response: Purposeful movement (11/14/16 0945) LLE Sensation: Decreased (11/14/16 0945) RLE Motor Response: Purposeful movement (11/14/16 0945) RLE Sensation: Decreased (11/14/16 0945) L Sensory Level: L2-Upper inner thigh, upper buttock (11/14/16 0945) R Sensory Level: L3-Anterior knee, lower leg (bends knee) (11/14/16 0945)  Audry Pili

## 2016-11-14 NOTE — H&P (View-Only) (Signed)
Pam [name] DOB: [birth year] Married / Language: Cleophus Molt / Race: White Female Date of admission: November 14, 2016  Chief complaint: Right knee pain History of Present Illness The patient is a 71 year old female who comes in for a preoperative History and Physical. The patient is scheduled for a right total knee arthroplasty to be performed by Dr. Dione Plover. Aluisio, MD at [facility] on 11-14-2016. The patient is a 71 year old female who presented with knee complaints. The patient reports right knee symptoms including: pain, swelling (right lower leg), instability, catching, giving way, soreness and grinding which began year(s) ago without any known injury. The patient describes their pain as aching and burning (right thigh). The patient feels that the symptoms are worsening. The patient has the current diagnosis of knee osteoarthritis. Past treatment for this problem has included intra-articular injection of corticosteroids (Euflexxa 2017: did not help) and nonsteroidal anti-inflammatory drugs (dosepak 2017: helped some). Symptoms are reported to be located in the right knee and right lateral knee and include knee pain. The patient reports that symptoms radiate to the right thigh and right lower leg. Symptoms are exacerbated by motion at the knee and weight bearing. Symptoms are relieved by rest. Current treatment includes application of ice and nonsteroidal anti-inflammatory drugs (Aleve). Note for "Knee pain": HX right knee scope 2016 Dr. Gladstone Lighter She states that her right knee has gotten progressively worse over time. The injections have not helped her at all. She used to be extremely active but the knee is preventing her from doing a lot of things she desires. She would like to be able to get back in dance and do other recreational activities that she is no longer able to do. She also has significant pain. She would like to porceed with surgery at this time. They have been treated  conservatively in the past for the above stated problem and despite conservative measures, they continue to have progressive pain and severe functional limitations and dysfunction. They have failed non-operative management including home exercise, medications, and injections. It is felt that they would benefit from undergoing total joint replacement. Risks and benefits of the procedure have been discussed with the patient and they elect to proceed with surgery. There are no active contraindications to surgery such as ongoing infection or rapidly progressive neurological disease.   Problem List/Past Medical Toe fracture, right (S92.911A)  Pain in joint involving right ankle and foot (M25.571)  Encounter for care following Arthroscopic Partial Lateral Menisectomy (Z47.89)  Primary osteoarthritis of right knee (M17.11)  Pain of right hip joint (M25.551)  Lateral epicondylitis of right elbow (M77.11)  Acute pain of left shoulder (M25.512)  Left leg pain (M79.605)  Lumbar back pain with radiculopathy affecting left lower extremity (M54.16)  Hypercholesterolemia  Menopause  Acute pain of right knee (M25.561)  Plantar fasciitis, left (M72.2)  Spondylosis of lumbar region without myelopathy or radiculopathy (M47.816)  Acute left-sided low back pain with left-sided sciatica (M54.42)  Displaced spiral fracture of shaft of right tibia, subsequent encounter for closed fracture with routine healing (S82.241D)  Trochanteric bursitis of right hip (M70.61)    Allergies / Intolerances CODEINE [12/01/2008]: Nausea. NARCOTICS [04/30/1997]: Nausea. HYDROcodone Bitartrate *CHEMICALS*  Nausea.  Family History Family history unknown - Adopted  Liver Disease, Chronic  Sister. First Degree Relatives   Social History Tobacco use  Never smoker. 10/16/2013 never smoker No history of drug/alcohol rehab  Exercise  Exercises daily; does running / walking and gym / weights Living  situation  live with spouse Not under pain contract  Children  3 Marital status  married Current work status  retired Current drinker  10/16/2013: Currently drinks wine only occasionally per week Drug/Alcohol Rehab (Previously)  no Tobacco / smoke exposure  10/16/2013: no no Drug/Alcohol Rehab (Currently)  no Number of flights of stairs before winded  greater than 5 Pain Contract  no Alcohol use  current drinker; drinks wine Illicit drug use  no Post-Surgical Plans  Home With Family, Home with HHPT.  Medication History One a day vitamin Active. Fish Oil Active. Over the counter eye drop Active. ZyrTEC Allergy (10MG  Tablet, Oral as needed) Active. Atorvastatin Calcium (20MG  Tablet, Oral) Active. FLUoxetine HCl (20MG  Capsule, Oral daily) Active. B12 Active. B3-500-GR (500MG  Tablet ER, Oral) Active. Calcium Active. ALPRAZolam (0.25MG  Tablet, Oral as needed) Active.   Past Surgical History Cataract Surgery  left Hemorrhoidectomy  Tubal Ligation  Anal Fissure Repair  Arthroscopic Knee Surgery - Right [09/03/2014]:    Review of Systems  General Not Present- Chills, Fatigue, Fever, Memory Loss, Night Sweats, Weight Gain and Weight Loss. Skin Not Present- Eczema, Hives, Itching, Lesions and Rash. HEENT Not Present- Dentures, Double Vision, Headache, Hearing Loss, Tinnitus and Visual Loss. Respiratory Not Present- Allergies, Chronic Cough, Coughing up blood, Shortness of breath at rest and Shortness of breath with exertion. Cardiovascular Not Present- Chest Pain, Difficulty Breathing Lying Down, Murmur, Palpitations, Racing/skipping heartbeats and Swelling. Gastrointestinal Not Present- Abdominal Pain, Bloody Stool, Constipation, Diarrhea, Difficulty Swallowing, Heartburn, Jaundice, Loss of appetitie, Nausea and Vomiting. Female Genitourinary Not Present- Blood in Urine, Discharge, Flank Pain, Incontinence, Painful Urination, Urgency, Urinary  frequency, Urinary Retention, Urinating at Night and Weak urinary stream. Musculoskeletal Not Present- Back Pain, Joint Pain, Joint Swelling, Morning Stiffness, Muscle Pain, Muscle Weakness and Spasms. Neurological Not Present- Blackout spells, Difficulty with balance, Dizziness, Paralysis, Tremor and Weakness. Psychiatric Not Present- Insomnia.  Vitals Weight: 145 lb Height: 61in Weight was reported by patient. Height was reported by patient. Body Surface Area: 1.65 m Body Mass Index: 27.4 kg/m  Pulse: 88 (Regular)  BP: 108/60 (Sitting, Right Arm, Standard)       Physical Exam General Mental Status -Alert, cooperative and good historian. General Appearance-pleasant, Not in acute distress. Orientation-Oriented X3. Build & Nutrition-Well nourished and Well developed. Note: short stature  Head and Neck Head-normocephalic, atraumatic . Neck Global Assessment - supple, no bruit auscultated on the right, no bruit auscultated on the left.  Eye Pupil - Bilateral-Regular and Round. Motion - Bilateral-EOMI.  ENMT Note: bilateral heraing aids   Chest and Lung Exam Auscultation Breath sounds - clear at anterior chest wall and clear at posterior chest wall. Adventitious sounds - No Adventitious sounds.  Cardiovascular Auscultation Rhythm - Regular rate and rhythm. Heart Sounds - S1 WNL and S2 WNL. Murmurs & Other Heart Sounds - Auscultation of the heart reveals - No Murmurs.  Abdomen Palpation/Percussion Tenderness - Abdomen is non-tender to palpation. Rigidity (guarding) - Abdomen is soft. Auscultation Auscultation of the abdomen reveals - Bowel sounds normal.  Female Genitourinary Note: Not done, not pertinent to present illness   Musculoskeletal Note: Evaluation of her hips shows normal range of motion with no discomfort. Her left knee shows no effusion with normal alignment. Her range of motion is 0 to 135 on the left. There is no  tenderness or instability. Her right knee shows a valgus deformity. Range about 5 to 120. Moderate crepitus on range of motion. Tenderness lateral greater than medial with no instability.  RADIOGRAPHS AP of both knees and lateral of the right show that she has bone-on-bone arthritis, lateral and patellofemoral in the right knee.   Assessment & Plan Primary osteoarthritis of right knee (M17.11)  Note:Surgical Plans: Right Total Knee Replacement  Disposition: Home with husband, HHPT  PCP: Dr. Harrington Challenger  IV TXA  Anesthesia Issues: some nausea  Patient was instructed on what medications to stop prior to surgery.  Signed electronically by Joelene Millin, III PA-C

## 2016-11-14 NOTE — Evaluation (Signed)
Physical Therapy Evaluation Patient Details Name: Pam [name] MRN: [id number] DOB: 1945/03/21 Today's Date: 11/14/2016   History of Present Illness  R TKA 11/14/16; bursectomy R hip 10/20/16  Clinical Impression  Pt is s/p TKA resulting in the deficits listed below (see PT Problem List). Pt ambulated 36' with RW, initiated HEP. Good progress expected.  Pt will benefit from skilled PT to increase their independence and safety with mobility to allow discharge to the venue listed below.      Follow Up Recommendations Home health PT    Equipment Recommendations  3in1 (PT)    Recommendations for Other Services       Precautions / Restrictions Precautions Precautions: Knee Precaution Comments: reviewed no pillow under knee Restrictions Weight Bearing Restrictions: No      Mobility  Bed Mobility Overal bed mobility: Modified Independent             General bed mobility comments: HOB up  Transfers Overall transfer level: Needs assistance Equipment used: Rolling walker (2 wheeled) Transfers: Sit to/from Stand Sit to Stand: Min assist         General transfer comment: min A to rise  Ambulation/Gait Ambulation/Gait assistance: Min guard Ambulation Distance (Feet): 45 Feet Assistive device: Rolling walker (2 wheeled) Gait Pattern/deviations: Step-to pattern;Decreased step length - right;Decreased step length - left;Decreased weight shift to right   Gait velocity interpretation: Below normal speed for age/gender General Gait Details: VCs sequencing, no LOB  Stairs            Wheelchair Mobility    Modified Rankin (Stroke Patients Only)       Balance Overall balance assessment: Modified Independent                                           Pertinent Vitals/Pain Pain Assessment: 0-10 Pain Score: 3  Pain Location: R knee Pain Descriptors / Indicators: Sore Pain Intervention(s): Limited activity within patient's tolerance;Monitored  during session;Premedicated before session;Ice applied    Home Living Family/patient expects to be discharged to:: Private residence Living Arrangements: Spouse/significant other Available Help at Discharge: Family Type of Home: House Home Access: Level entry     Home Layout: One level Home Equipment: Environmental consultant - 2 wheels      Prior Function Level of Independence: Independent               Hand Dominance        Extremity/Trunk Assessment   Upper Extremity Assessment Upper Extremity Assessment: Overall WFL for tasks assessed    Lower Extremity Assessment Lower Extremity Assessment: RLE deficits/detail RLE Deficits / Details: SLR 3/5 with quad lag, knee ext -3/5, AAROM 10-40* knee    Cervical / Trunk Assessment Cervical / Trunk Assessment: Normal  Communication   Communication: No difficulties  Cognition Arousal/Alertness: Awake/alert Behavior During Therapy: WFL for tasks assessed/performed Overall Cognitive Status: Within Functional Limits for tasks assessed                                        General Comments      Exercises Total Joint Exercises Ankle Circles/Pumps: AROM;Both;10 reps;Supine Quad Sets: AROM;Both;10 reps;Supine Heel Slides: AAROM;Right;10 reps;Supine Long Arc Quad: AROM;Right;5 reps;Seated Goniometric ROM: 10-40 AAROM R knee   Assessment/Plan    PT Assessment Patient needs continued  PT services  PT Problem List Decreased strength;Decreased range of motion;Decreased activity tolerance;Decreased mobility;Decreased knowledge of use of DME;Pain       PT Treatment Interventions DME instruction;Gait training;Functional mobility training;Therapeutic exercise;Patient/family education;Therapeutic activities    PT Goals (Current goals can be found in the Care Plan section)  Acute Rehab PT Goals Patient Stated Goal: to walk PT Goal Formulation: With patient Time For Goal Achievement: 11/21/16 Potential to Achieve Goals:  Good    Frequency 7X/week   Barriers to discharge        Co-evaluation               AM-PAC PT "6 Clicks" Daily Activity  Outcome Measure Difficulty turning over in bed (including adjusting bedclothes, sheets and blankets)?: None Difficulty moving from lying on back to sitting on the side of the bed? : A Little Difficulty sitting down on and standing up from a chair with arms (e.g., wheelchair, bedside commode, etc,.)?: Unable Help needed moving to and from a bed to chair (including a wheelchair)?: A Little Help needed walking in hospital room?: A Little Help needed climbing 3-5 steps with a railing? : A Lot 6 Click Score: 16    End of Session Equipment Utilized During Treatment: Gait belt Activity Tolerance: Patient tolerated treatment well Patient left: in chair;with call bell/phone within reach;with chair alarm set Nurse Communication: Mobility status PT Visit Diagnosis: Difficulty in walking, not elsewhere classified (R26.2);Muscle weakness (generalized) (M62.81);Pain Pain - Right/Left: Right Pain - part of body: Knee    Time: 7371-0626 PT Time Calculation (min) (ACUTE ONLY): 29 min   Charges:   PT Evaluation $PT Eval Low Complexity: 1 Low PT Treatments $Gait Training: 8-22 mins   PT G Codes:          [name] 11/14/2016, 4:52 PM [phone]

## 2016-11-14 NOTE — Interval H&P Note (Signed)
History and Physical Interval Note:  11/14/2016 7:03 AM  Pam [name]  has presented today for surgery, with the diagnosis of Osteoarthritis Right Knee  The various methods of treatment have been discussed with the patient and family. After consideration of risks, benefits and other options for treatment, the patient has consented to  Procedure(s): RIGHT TOTAL KNEE ARTHROPLASTY (Right) as a surgical intervention .  The patient's history has been reviewed, patient examined, no change in status, stable for surgery.  I have reviewed the patient's chart and labs.  Questions were answered to the patient's satisfaction.     Gearlean Alf

## 2016-11-14 NOTE — Anesthesia Procedure Notes (Signed)
Anesthesia Regional Block: Adductor canal block   Pre-Anesthetic Checklist: ,, timeout performed, Correct Patient, Correct Site, Correct Laterality, Correct Procedure, Correct Position, site marked, Risks and benefits discussed,  Surgical consent,  Pre-op evaluation,  At surgeon's request and post-op pain management  Laterality: Right  Prep: chloraprep       Needles:  Injection technique: Single-shot  Needle Type: Echogenic Needle     Needle Length: 9cm  Needle Gauge: 21     Additional Needles:   Narrative:  Start time: 11/14/2016 6:52 AM End time: 11/14/2016 6:55 AM Injection made incrementally with aspirations every 5 mL.  Performed by: Personally  Anesthesiologist: Renold Don E  Additional Notes: No pain on injection. No increased resistance to injection. Injection made in 5cc increments. Good needle visualization. Patient tolerated the procedure well.

## 2016-11-14 NOTE — Anesthesia Procedure Notes (Signed)
Spinal  Patient location during procedure: OR Start time: 11/14/2016 7:18 AM Staffing Anesthesiologist: Audry Pili Resident/CRNA: [place] Jari Dipasquale C Performed: resident/CRNA  Preanesthetic Checklist Completed: patient identified, site marked, surgical consent, pre-op evaluation, timeout performed, IV checked, risks and benefits discussed and monitors and equipment checked Spinal Block Patient position: sitting Prep: Betadine Patient monitoring: heart rate, continuous pulse ox and blood pressure Location: L3-4 Injection technique: single-shot Needle Needle type: Pencan  Needle gauge: 25 G Needle length: 9 cm Assessment Sensory level: T6 Additional Notes Expiration date of kit checked and confirmed. Patient tolerated procedure well, without complications.

## 2016-11-14 NOTE — Transfer of Care (Signed)
Immediate Anesthesia Transfer of Care Note  Patient: Pam [name]  Procedure(s) Performed: Procedure(s): RIGHT TOTAL KNEE ARTHROPLASTY (Right)  Patient Location: PACU  Anesthesia Type:Spinal  Level of Consciousness: awake, alert  and oriented  Airway & Oxygen Therapy: Patient Spontanous Breathing and Patient connected to face mask oxygen  Post-op Assessment: Report given to RN and Post -op Vital signs reviewed and stable  Post vital signs: Reviewed and stable  Last Vitals:  Vitals:   11/14/16 0533  BP: 135/83  Pulse: 79  Resp: 18  Temp: 36.7 C  SpO2: 97%    Last Pain:  Vitals:   11/14/16 0620  TempSrc:   PainSc: 7          Complications: No apparent anesthesia complications

## 2016-11-14 NOTE — Discharge Instructions (Addendum)
°  ° °Dr. Frank Aluisio °Total Joint Specialist °[facility] °[address] °[place], [place] [address] °[phone] ° °TOTAL KNEE REPLACEMENT POSTOPERATIVE DIRECTIONS ° °Knee Rehabilitation, Guidelines Following Surgery  °Results after knee surgery are often greatly improved when you follow the exercise, range of motion and muscle strengthening exercises prescribed by your doctor. Safety measures are also important to protect the knee from further injury. Any time any of these exercises cause you to have increased pain or swelling in your knee joint, decrease the amount until you are comfortable again and slowly increase them. If you have problems or questions, call your caregiver or physical therapist for advice.  ° °HOME CARE INSTRUCTIONS  °Remove items at home which could result in a fall. This includes throw rugs or furniture in walking pathways.  °· ICE to the affected knee every three hours for 30 minutes at a time and then as needed for pain and swelling.  Continue to use ice on the knee for pain and swelling from surgery. You may notice swelling that will progress down to the foot and ankle.  This is normal after surgery.  Elevate the leg when you are not up walking on it.   °· Continue to use the breathing machine which will help keep your temperature down.  It is common for your temperature to cycle up and down following surgery, especially at night when you are not up moving around and exerting yourself.  The breathing machine keeps your lungs expanded and your temperature down. °· Do not place pillow under knee, focus on keeping the knee straight while resting ° °DIET °You may resume your previous home diet once your are discharged from the hospital. ° °DRESSING / WOUND CARE / SHOWERING °You may shower 3 days after surgery, but keep the wounds dry during showering.  You may use an occlusive plastic wrap (Press'n Seal for example), NO SOAKING/SUBMERGING IN THE BATHTUB.  If the  bandage gets wet, change with a clean dry gauze.  If the incision gets wet, pat the wound dry with a clean towel. °You may start showering once you are discharged home but do not submerge the incision under water. Just pat the incision dry and apply a dry gauze dressing on daily. °Change the surgical dressing daily and reapply a dry dressing each time. ° °ACTIVITY °Walk with your walker as instructed. °Use walker as long as suggested by your caregivers. °Avoid periods of inactivity such as sitting longer than an hour when not asleep. This helps prevent blood clots.  °You may resume a sexual relationship in one month or when given the OK by your doctor.  °You may return to work once you are cleared by your doctor.  °Do not drive a car for 6 weeks or until released by you surgeon.  °Do not drive while taking narcotics. ° °WEIGHT BEARING °Weight bearing as tolerated with assist device (walker, cane, etc) as directed, use it as long as suggested by your surgeon or therapist, typically at least 4-6 weeks. ° °POSTOPERATIVE CONSTIPATION PROTOCOL °Constipation - defined medically as fewer than three stools per week and severe constipation as less than one stool per week. ° °One of the most common issues patients have following surgery is constipation.  Even if you have a regular bowel pattern at home, your normal regimen is likely to be disrupted due to multiple reasons following surgery.  Combination of anesthesia, postoperative narcotics, change in appetite and fluid intake all can affect your   bowels.  In order to avoid complications following surgery, here are some recommendations in order to help you during your recovery period. ° °Colace (docusate) - Pick up an over-the-counter form of Colace or another stool softener and take twice a day as long as you are requiring postoperative pain medications.  Take with a full glass of water daily.  If you experience loose stools or diarrhea, hold the colace until you stool forms  back up.  If your symptoms do not get better within 1 week or if they get worse, check with your doctor. ° °Dulcolax (bisacodyl) - Pick up over-the-counter and take as directed by the product packaging as needed to assist with the movement of your bowels.  Take with a full glass of water.  Use this product as needed if not relieved by Colace only.  ° °MiraLax (polyethylene glycol) - Pick up over-the-counter to have on hand.  MiraLax is a solution that will increase the amount of water in your bowels to assist with bowel movements.  Take as directed and can mix with a glass of water, juice, soda, coffee, or tea.  Take if you go more than two days without a movement. °Do not use MiraLax more than once per day. Call your doctor if you are still constipated or irregular after using this medication for 7 days in a row. ° °If you continue to have problems with postoperative constipation, please contact the office for further assistance and recommendations.  If you experience "the worst abdominal pain ever" or develop nausea or vomiting, please contact the office immediatly for further recommendations for treatment. ° °ITCHING ° If you experience itching with your medications, try taking only a single pain pill, or even half a pain pill at a time.  You can also use Benadryl over the counter for itching or also to help with sleep.  ° °TED HOSE STOCKINGS °Wear the elastic stockings on both legs for three weeks following surgery during the day but you may remove then at night for sleeping. ° °MEDICATIONS °See your medication summary on the “After Visit Summary” that the nursing staff will review with you prior to discharge.  You may have some home medications which will be placed on hold until you complete the course of blood thinner medication.  It is important for you to complete the blood thinner medication as prescribed by your surgeon.  Continue your approved medications as instructed at time of  discharge. ° °PRECAUTIONS °If you experience chest pain or shortness of breath - call 911 immediately for transfer to the hospital emergency department.  °If you develop a fever greater that 101 F, purulent drainage from wound, increased redness or drainage from wound, foul odor from the wound/dressing, or calf pain - CONTACT YOUR SURGEON.   °                                                °FOLLOW-UP APPOINTMENTS °Make sure you keep all of your appointments after your operation with your surgeon and caregivers. You should call the office at the above phone number and make an appointment for approximately two weeks after the date of your surgery or on the date instructed by your surgeon outlined in the "After Visit Summary". ° ° °RANGE OF MOTION AND STRENGTHENING EXERCISES  °Rehabilitation of the knee is important following a knee   injury or an operation. After just a few days of immobilization, the muscles of the thigh which control the knee become weakened and shrink (atrophy). Knee exercises are designed to build up the tone and strength of the thigh muscles and to improve knee motion. Often times heat used for twenty to thirty minutes before working out will loosen up your tissues and help with improving the range of motion but do not use heat for the first two weeks following surgery. These exercises can be done on a training (exercise) mat, on the floor, on a table or on a bed. Use what ever works the best and is most comfortable for you Knee exercises include:  °Leg Lifts - While your knee is still immobilized in a splint or cast, you can do straight leg raises. Lift the leg to 60 degrees, hold for 3 sec, and slowly lower the leg. Repeat 10-20 times 2-3 times daily. Perform this exercise against resistance later as your knee gets better.  °Quad and Hamstring Sets - Tighten up the muscle on the front of the thigh (Quad) and hold for 5-10 sec. Repeat this 10-20 times hourly. Hamstring sets are done by pushing the  foot backward against an object and holding for 5-10 sec. Repeat as with quad sets.  °· Leg Slides: Lying on your back, slowly slide your foot toward your buttocks, bending your knee up off the floor (only go as far as is comfortable). Then slowly slide your foot back down until your leg is flat on the floor again. °· Angel Wings: Lying on your back spread your legs to the side as far apart as you can without causing discomfort.  °A rehabilitation program following serious knee injuries can speed recovery and prevent re-injury in the future due to weakened muscles. Contact your doctor or a physical therapist for more information on knee rehabilitation.  ° °IF YOU ARE TRANSFERRED TO A SKILLED REHAB FACILITY °If the patient is transferred to a skilled rehab facility following release from the hospital, a list of the current medications will be sent to the facility for the patient to continue.  When discharged from the skilled rehab facility, please have the facility set up the patient's Home Health Physical Therapy prior to being released. Also, the skilled facility will be responsible for providing the patient with their medications at time of release from the facility to include their pain medication, the muscle relaxants, and their blood thinner medication. If the patient is still at the rehab facility at time of the two week follow up appointment, the skilled rehab facility will also need to assist the patient in arranging follow up appointment in our office and any transportation needs. ° °MAKE SURE YOU:  °Understand these instructions.  °Get help right away if you are not doing well or get worse.  ° ° °Pick up stool softner and laxative for home use following surgery while on pain medications. °Do not submerge incision under water. °Please use good hand washing techniques while changing dressing each day. °May shower starting three days after surgery. °Please use a clean towel to pat the incision dry following  showers. °Continue to use ice for pain and swelling after surgery. °Do not use any lotions or creams on the incision until instructed by your surgeon. ° °Take Xarelto for two and a half more weeks following discharge from the hospital, then discontinue Xarelto. °Once the patient has completed the blood thinner regimen, then take a Baby 81 mg Aspirin daily   for three more weeks. ° ° °Information on my medicine - XARELTO® (Rivaroxaban) ° ° °Why was Xarelto® prescribed for you? °Xarelto® was prescribed for you to reduce the risk of blood clots forming after orthopedic surgery. The medical term for these abnormal blood clots is venous thromboembolism (VTE). ° °What do you need to know about xarelto® ? °Take your Xarelto® ONCE DAILY at the same time every day. °You may take it either with or without food. ° °If you have difficulty swallowing the tablet whole, you may crush it and mix in applesauce just prior to taking your dose. ° °Take Xarelto® exactly as prescribed by your doctor and DO NOT stop taking Xarelto® without talking to the doctor who prescribed the medication.  Stopping without other VTE prevention medication to take the place of Xarelto® may increase your risk of developing a clot. ° °After discharge, you should have regular check-up appointments with your healthcare provider that is prescribing your Xarelto®.   ° °What do you do if you miss a dose? °If you miss a dose, take it as soon as you remember on the same day then continue your regularly scheduled once daily regimen the next day. Do not take two doses of Xarelto® on the same day.  ° °Important Safety Information °A possible side effect of Xarelto® is bleeding. You should call your healthcare provider right away if you experience any of the following: °? Bleeding from an injury or your nose that does not stop. °? Unusual colored urine (red or dark brown) or unusual colored stools (red or black). °? Unusual bruising for unknown reasons. °? A serious  fall or if you hit your head (even if there is no bleeding). ° °Some medicines may interact with Xarelto® and might increase your risk of bleeding while on Xarelto®. To help avoid this, consult your healthcare provider or pharmacist prior to using any new prescription or non-prescription medications, including herbals, vitamins, non-steroidal anti-inflammatory drugs (NSAIDs) and supplements. ° °This website has more information on Xarelto®: www.xarelto.com. ° ° °

## 2016-11-14 NOTE — Op Note (Signed)
OPERATIVE REPORT-TOTAL KNEE ARTHROPLASTY   Pre-operative diagnosis- Osteoarthritis  Right knee(s)  Post-operative diagnosis- Osteoarthritis Right knee(s)  Procedure-  Right  Total Knee Arthroplasty (Depuy Attune)  Surgeon- Pam [name]. Pam Sweeten, MD  Assistant- Pam Jourdain, PA-C   Anesthesia-  Adductor canal block and spinal  EBL-* No blood loss amount entered *   Drains Hemovac  Tourniquet time-  Total Tourniquet Time Documented: Thigh (Right) - 27 minutes Total: Thigh (Right) - 27 minutes     Complications- None  Condition-PACU - hemodynamically stable.   Brief Clinical Note  Pam [name] is a 71 y.o. year old female with end stage OA of her right knee with progressively worsening pain and dysfunction. She has constant pain, with activity and at rest and significant functional deficits with difficulties even with ADLs. She has had extensive non-op management including analgesics, injections of cortisone and viscosupplements, and home exercise program, but remains in significant pain with significant dysfunction.Radiographs show bone on bone arthritis medal and patellofemoral. She presents now for right Total Knee Arthroplasty.    Procedure in detail---   The patient is brought into the operating room and positioned supine on the operating table. After successful administration of  Adductor canal block and spinal,   a tourniquet is placed high on the  Right thigh(s) and the lower extremity is prepped and draped in the usual sterile fashion. Time out is performed by the operating team and then the  Right lower extremity is wrapped in Esmarch, knee flexed and the tourniquet inflated to 300 mmHg.       A midline incision is made with a ten blade through the subcutaneous tissue to the level of the extensor mechanism. A fresh blade is used to make a medial parapatellar arthrotomy. Soft tissue over the proximal medial tibia is subperiosteally elevated to the joint line with a knife  and into the semimembranosus bursa with a Cobb elevator. Soft tissue over the proximal lateral tibia is elevated with attention being paid to avoiding the patellar tendon on the tibial tubercle. The patella is everted, knee flexed 90 degrees and the ACL and PCL are removed. Findings are bone on bone medial with exposed bone patellofemoral        The drill is used to create a starting hole in the distal femur and the canal is thoroughly irrigated with sterile saline to remove the fatty contents. The 5 degree Right  valgus alignment guide is placed into the femoral canal and the distal femoral cutting block is pinned to remove 9 mm off the distal femur. Resection is made with an oscillating saw.      The tibia is subluxed forward and the menisci are removed. The extramedullary alignment guide is placed referencing proximally at the medial aspect of the tibial tubercle and distally along the second metatarsal axis and tibial crest. The block is pinned to remove 4mm off the more deficient medial  side. Resection is made with an oscillating saw. Size 4is the most appropriate size for the tibia and the proximal tibia is prepared with the modular drill and keel punch for that size.      The femoral sizing guide is placed and size 4 is most appropriate. Rotation is marked off the epicondylar axis and confirmed by creating a rectangular flexion gap at 90 degrees. The size 4 cutting block is pinned in this rotation and the anterior, posterior and chamfer cuts are made with the oscillating saw. The intercondylar block is then placed and that  cut is made.      Trial size 4 tibial component, trial size 4 posterior stabilized femur and a 8  mm posterior stabilized rotating platform insert trial is placed. Full extension is achieved with excellent varus/valgus and anterior/posterior balance throughout full range of motion. The patella is everted and thickness measured to be 20  mm. Free hand resection is taken to 10 mm, a 35  template is placed, lug holes are drilled, trial patella is placed, and it tracks normally. Osteophytes are removed off the posterior femur with the trial in place. All trials are removed and the cut bone surfaces prepared with pulsatile lavage. Cement is mixed and once ready for implantation, the size 4 tibial implant, size  4 posterior stabilized femoral component, and the size 35 patella are cemented in place and the patella is held with the clamp. The trial insert is placed and the knee held in full extension. The Exparel (20 ml mixed with 40 ml saline) is injected into the extensor mechanism, posterior capsule, medial and lateral gutters and subcutaneous tissues.  All extruded cement is removed and once the cement is hard the permanent 8 mm posterior stabilized rotating platform insert is placed into the tibial tray.      The wound is copiously irrigated with saline solution and the extensor mechanism closed over a hemovac drain with #1 V-loc suture. The tourniquet is released for a total tourniquet time of 27  minutes. Flexion against gravity is 140 degrees and the patella tracks normally. Subcutaneous tissue is closed with 2.0 vicryl and subcuticular with running 4.0 Monocryl. The incision is cleaned and dried and steri-strips and a bulky sterile dressing are applied. The limb is placed into a knee immobilizer and the patient is awakened and transported to recovery in stable condition.      Please note that a surgical assistant was a medical necessity for this procedure in order to perform it in a safe and expeditious manner. Surgical assistant was necessary to retract the ligaments and vital neurovascular structures to prevent injury to them and also necessary for proper positioning of the limb to allow for anatomic placement of the prosthesis.   Pam [name] Pam Bowring, MD    11/14/2016, 8:10 AM

## 2016-11-14 NOTE — Progress Notes (Signed)
PT Cancellation Note  Patient Details Name: Pam [name] MRN: [id number] DOB: 1945/08/18   Cancelled Treatment:    Reason Eval/Treat Not Completed: Other (comment) (spinal/Adductor block not yet worn off. Will follow)   [name] 11/14/2016, 3:00 PM [phone]

## 2016-11-15 ENCOUNTER — Encounter (HOSPITAL_COMMUNITY): Payer: Self-pay | Admitting: Orthopedic Surgery

## 2016-11-15 LAB — BASIC METABOLIC PANEL
ANION GAP: 8 (ref 5–15)
BUN: 9 mg/dL (ref 6–20)
CALCIUM: 9.2 mg/dL (ref 8.9–10.3)
CHLORIDE: 109 mmol/L (ref 101–111)
CO2: 26 mmol/L (ref 22–32)
CREATININE: 0.52 mg/dL (ref 0.44–1.00)
GFR calc Af Amer: >60 mL/min (ref >60)
GFR calc non Af Amer: >60 mL/min (ref >60)
GLUCOSE: 140 mg/dL — AB (ref 65–99)
Potassium: 4.1 mmol/L (ref 3.5–5.1)
Sodium: 143 mmol/L (ref 135–145)

## 2016-11-15 LAB — CBC
HEMATOCRIT: 32.1 % — AB (ref 36.0–46.0)
Hemoglobin: 10.8 g/dL — ABNORMAL LOW (ref 12.0–15.0)
MCH: 32.6 pg (ref 26.0–34.0)
MCHC: 33.6 g/dL (ref 30.0–36.0)
MCV: 97 fL (ref 78.0–100.0)
Platelets: 234 10*3/uL (ref 150–400)
RBC: 3.31 MIL/uL — ABNORMAL LOW (ref 3.87–5.11)
RDW: 14.2 % (ref 11.5–15.5)
WBC: 11.4 10*3/uL — ABNORMAL HIGH (ref 4.0–10.5)

## 2016-11-15 MED ORDER — METHYLPREDNISOLONE 4 MG PO TBPK
4.0000 mg | ORAL_TABLET | ORAL | Status: AC
  Administered 2016-11-15: 4 mg via ORAL

## 2016-11-15 MED ORDER — METHYLPREDNISOLONE 4 MG PO TBPK
8.0000 mg | ORAL_TABLET | Freq: Every evening | ORAL | Status: AC
  Administered 2016-11-15: 8 mg via ORAL

## 2016-11-15 MED ORDER — METHYLPREDNISOLONE 4 MG PO TBPK: 8.0000 mg | ORAL_TABLET | Freq: Every evening | ORAL | Status: DC

## 2016-11-15 MED ORDER — METHOCARBAMOL 500 MG PO TABS: 500.0000 mg | ORAL_TABLET | Freq: Four times a day (QID) | ORAL | 0 refills | Status: DC | PRN

## 2016-11-15 MED ORDER — TRAMADOL HCL 50 MG PO TABS: 50.0000 mg | ORAL_TABLET | Freq: Four times a day (QID) | ORAL | 0 refills | Status: DC | PRN

## 2016-11-15 MED ORDER — HYDROMORPHONE HCL 2 MG PO TABS: 2.0000 mg | ORAL_TABLET | ORAL | 0 refills | Status: DC | PRN

## 2016-11-15 MED ORDER — METHYLPREDNISOLONE 4 MG PO TBPK
8.0000 mg | ORAL_TABLET | Freq: Every morning | ORAL | Status: AC
  Administered 2016-11-15: 8 mg via ORAL
  Filled 2016-11-15: qty 21

## 2016-11-15 MED ORDER — RIVAROXABAN 10 MG PO TABS: 10.0000 mg | ORAL_TABLET | Freq: Every day | ORAL | 0 refills | Status: DC

## 2016-11-15 MED ORDER — METHYLPREDNISOLONE 4 MG PO TBPK
4.0000 mg | ORAL_TABLET | Freq: Three times a day (TID) | ORAL | Status: DC
  Administered 2016-11-16 (×2): 4 mg via ORAL

## 2016-11-15 MED ORDER — METHYLPREDNISOLONE 4 MG PO TBPK: ORAL_TABLET | ORAL | 0 refills | Status: DC

## 2016-11-15 MED ORDER — FLUOXETINE HCL 20 MG PO CAPS
40.0000 mg | ORAL_CAPSULE | Freq: Every day | ORAL | Status: DC
  Administered 2016-11-15 – 2016-11-16 (×2): 40 mg via ORAL
  Filled 2016-11-15 (×2): qty 2

## 2016-11-15 MED ORDER — METHYLPREDNISOLONE 4 MG PO TBPK
4.0000 mg | ORAL_TABLET | Freq: Four times a day (QID) | ORAL | Status: DC
Start: 2016-11-17 — End: 2016-11-16

## 2016-11-15 NOTE — Evaluation (Addendum)
Occupational Therapy Evaluation Patient Details Name: Pam [name] MRN: [id number] DOB: March 28, 1945 Today's Date: 11/15/2016    History of Present Illness R TKA 11/14/16; bursectomy R hip 10/20/16   Clinical Impression   This 71 y/o F presents with the above. At baseline Pt is independent with ADLs and functional mobility. Pt completed room level functional mobility with MinGuard assist at RW level, requires Min verbal safety cues for safe RW use. Pt currently requires MaxA for LB ADLs. Pt will return home with spouse assist for ADLs PRN. Will continue to follow acutely to progress Pt's safety and independence with ADLs and functional mobility prior to return home.     Follow Up Recommendations  DC plan and follow up therapy as arranged by surgeon;Supervision/Assistance - 24 hour    Equipment Recommendations  3 in 1 bedside commode           Precautions / Restrictions Precautions Precautions: Knee Precaution Comments: reviewed no pillow under knee Restrictions Weight Bearing Restrictions: No      Mobility Bed Mobility Overal bed mobility: Needs Assistance Bed Mobility: Sit to Supine       Sit to supine: Min guard   General bed mobility comments: close guard for safety   Transfers Overall transfer level: Needs assistance Equipment used: Rolling walker (2 wheeled) Transfers: Sit to/from Stand Sit to Stand: Min guard         General transfer comment: close guard for safety, verbal cues for hand placement and safe RW use during transfer completion                                                ADL either performed or assessed with clinical judgement   ADL Overall ADL's : Needs assistance/impaired Eating/Feeding: Set up;Sitting   Grooming: Min guard;Wash/dry hands;Standing   Upper Body Bathing: Min guard;Sitting   Lower Body Bathing: Moderate assistance;Sit to/from stand   Upper Body Dressing : Min guard;Sitting   Lower Body Dressing:  Maximal assistance;Sit to/from stand   Toilet Transfer: Minimal assistance;Ambulation;BSC;RW Toilet Transfer Details (indicate cue type and reason): BSC over toilet Toileting- Clothing Manipulation and Hygiene: Sit to/from stand;Min guard       Functional mobility during ADLs: Min guard;Rolling walker General ADL Comments: Pt requires verbal safety cues throughout session for safe RW use during functional mobility                          Pertinent Vitals/Pain Pain Assessment: Faces Faces Pain Scale: Hurts a little bit Pain Location: R knee Pain Descriptors / Indicators: Sore Pain Intervention(s): Limited activity within patient's tolerance;Monitored during session;Repositioned          Extremity/Trunk Assessment Upper Extremity Assessment Upper Extremity Assessment: Overall WFL for tasks assessed   Lower Extremity Assessment Lower Extremity Assessment: Defer to PT evaluation   Cervical / Trunk Assessment Cervical / Trunk Assessment: Normal   Communication Communication Communication: No difficulties   Cognition Arousal/Alertness: Awake/alert Behavior During Therapy: WFL for tasks assessed/performed Overall Cognitive Status: Within Functional Limits for tasks assessed  Home Living Family/patient expects to be discharged to:: Private residence Living Arrangements: Spouse/significant other Available Help at Discharge: Family Type of Home: House Home Access: Level entry     [place]: One level     Bathroom Shower/Tub: Teacher, early years/pre: [place]: Environmental consultant - 2 wheels          Prior Functioning/Environment Level of Independence: Independent                 OT Problem List: Decreased strength;Decreased range of motion;Decreased activity tolerance;Decreased safety awareness;Decreased knowledge of use of DME or AE;Decreased knowledge of  precautions      OT Treatment/Interventions: Self-care/ADL training;DME and/or AE instruction;Therapeutic activities;Balance training;Therapeutic exercise;Patient/family education    OT Goals(Current goals can be found in the care plan section) Acute Rehab OT Goals Patient Stated Goal: to walk; regain independence  OT Goal Formulation: With patient Time For Goal Achievement: 11/22/16 Potential to Achieve Goals: Good ADL Goals Pt Will Perform Grooming: with supervision;standing Pt Will Perform Upper Body Bathing: with supervision;sitting Pt Will Perform Lower Body Bathing: sit to/from stand;with min assist Pt Will Perform Lower Body Dressing: with min assist;with adaptive equipment;sit to/from stand (AE PRN) Pt Will Transfer to Toilet: with modified independence;ambulating;bedside commode (BSC over toilet ) Pt Will Perform Toileting - Clothing Manipulation and hygiene: with modified independence;sit to/from stand  OT Frequency: Min 2X/week                             AM-PAC PT "6 Clicks" Daily Activity     Outcome Measure Help from another person eating meals?: None Help from another person taking care of personal grooming?: A Little Help from another person toileting, which includes using toliet, bedpan, or urinal?: A Little Help from another person bathing (including washing, rinsing, drying)?: A Lot Help from another person to put on and taking off regular upper body clothing?: None Help from another person to put on and taking off regular lower body clothing?: A Lot 6 Click Score: 18   End of Session Equipment Utilized During Treatment: Gait belt;Rolling walker CPM Right Knee CPM Right Knee: On Nurse Communication: Mobility status  Activity Tolerance: Patient tolerated treatment well Patient left: in bed;with call bell/phone within reach;with bed alarm set  OT Visit Diagnosis: Other abnormalities of gait and mobility (R26.89)                Time: 0539-7673 OT  Time Calculation (min): 15 min Charges:  OT General Charges $OT Visit: 1 Visit OT Evaluation $OT Eval Low Complexity: 1 Low G-Codes:     Lou Cal, OT Pager [phone] 11/15/2016   Raymondo Band 11/15/2016, 3:03 PM

## 2016-11-15 NOTE — Discharge Summary (Signed)
Physician Discharge Summary   Patient ID: Pam [name] MRN: [id number] DOB/AGE: 31-Mar-1945 71 y.o.  Admit date: 11/14/2016 Discharge date: 11-16-2016  Primary Diagnosis:  Osteoarthritis  Right knee(s)  Admission Diagnoses:  Past Medical History:  Diagnosis Date  . Anxiety   . Arthritis   . Costochondritis   . GERD (gastroesophageal reflux disease)    Had surgery for  . Osteopenia 04/2016   T score -2.0 FRAX 6%/1.2%  . PONV (postoperative nausea and vomiting)    Discharge Diagnoses:   Principal Problem:   OA (osteoarthritis) of knee  Estimated body mass index is 28.81 kg/m as calculated from the following:   Height as of this encounter: '5\' 1"'  (1.549 m).   Weight as of this encounter: 69.2 kg (152 lb 8 oz).  Procedure:  Procedure(s) (LRB): RIGHT TOTAL KNEE ARTHROPLASTY (Right)   Consults: None  HPI: Pam [name] is a 71 y.o. year old female with end stage OA of her right knee with progressively worsening pain and dysfunction. She has constant pain, with activity and at rest and significant functional deficits with difficulties even with ADLs. She has had extensive non-op management including analgesics, injections of cortisone and viscosupplements, and home exercise program, but remains in significant pain with significant dysfunction.Radiographs show bone on bone arthritis medal and patellofemoral. She presents now for right Total Knee Arthroplasty.    Laboratory Data: Admission on 11/14/2016  Component Date Value Ref Range Status  . WBC 11/15/2016 11.4* 4.0 - 10.5 K/uL Final  . RBC 11/15/2016 3.31* 3.87 - 5.11 MIL/uL Final  . Hemoglobin 11/15/2016 10.8* 12.0 - 15.0 g/dL Final  . HCT 11/15/2016 32.1* 36.0 - 46.0 % Final  . MCV 11/15/2016 97.0  78.0 - 100.0 fL Final  . MCH 11/15/2016 32.6  26.0 - 34.0 pg Final  . MCHC 11/15/2016 33.6  30.0 - 36.0 g/dL Final  . RDW 11/15/2016 14.2  11.5 - 15.5 % Final  . Platelets 11/15/2016 234  150 - 400 K/uL Final  . Sodium  11/15/2016 143  135 - 145 mmol/L Final  . Potassium 11/15/2016 4.1  3.5 - 5.1 mmol/L Final  . Chloride 11/15/2016 109  101 - 111 mmol/L Final  . CO2 11/15/2016 26  22 - 32 mmol/L Final  . Glucose, Bld 11/15/2016 140* 65 - 99 mg/dL Final  . BUN 11/15/2016 9  6 - 20 mg/dL Final  . Creatinine, Ser 11/15/2016 0.52  0.44 - 1.00 mg/dL Final  . Calcium 11/15/2016 9.2  8.9 - 10.3 mg/dL Final  . GFR calc non Af Amer 11/15/2016 >60  >60 mL/min Final  . GFR calc Af Amer 11/15/2016 >60  >60 mL/min Final   Comment: (NOTE) The eGFR has been calculated using the CKD EPI equation. This calculation has not been validated in all clinical situations. eGFR's persistently <60 mL/min signify possible Chronic Kidney Disease.   Georgiann Hahn gap 11/15/2016 8  5 - 15 Final  Hospital Outpatient Visit on 11/07/2016  Component Date Value Ref Range Status  . aPTT 11/07/2016 26  24 - 36 seconds Final  . WBC 11/07/2016 4.9  4.0 - 10.5 K/uL Final  . RBC 11/07/2016 4.27  3.87 - 5.11 MIL/uL Final  . Hemoglobin 11/07/2016 13.5  12.0 - 15.0 g/dL Final  . HCT 11/07/2016 40.1  36.0 - 46.0 % Final  . MCV 11/07/2016 93.9  78.0 - 100.0 fL Final  . MCH 11/07/2016 31.6  26.0 - 34.0 pg Final  . MCHC 11/07/2016 33.7  30.0 - 36.0 g/dL Final  . RDW 11/07/2016 14.0  11.5 - 15.5 % Final  . Platelets 11/07/2016 280  150 - 400 K/uL Final  . Sodium 11/07/2016 141  135 - 145 mmol/L Final  . Potassium 11/07/2016 4.5  3.5 - 5.1 mmol/L Final  . Chloride 11/07/2016 106  101 - 111 mmol/L Final  . CO2 11/07/2016 26  22 - 32 mmol/L Final  . Glucose, Bld 11/07/2016 98  65 - 99 mg/dL Final  . BUN 11/07/2016 15  6 - 20 mg/dL Final  . Creatinine, Ser 11/07/2016 0.65  0.44 - 1.00 mg/dL Final  . Calcium 11/07/2016 9.6  8.9 - 10.3 mg/dL Final  . Total Protein 11/07/2016 6.8  6.5 - 8.1 g/dL Final  . Albumin 11/07/2016 4.1  3.5 - 5.0 g/dL Final  . AST 11/07/2016 27  15 - 41 U/L Final  . ALT 11/07/2016 23  14 - 54 U/L Final  . Alkaline Phosphatase  11/07/2016 103  38 - 126 U/L Final  . Total Bilirubin 11/07/2016 0.4  0.3 - 1.2 mg/dL Final  . GFR calc non Af Amer 11/07/2016 >60  >60 mL/min Final  . GFR calc Af Amer 11/07/2016 >60  >60 mL/min Final   Comment: (NOTE) The eGFR has been calculated using the CKD EPI equation. This calculation has not been validated in all clinical situations. eGFR's persistently <60 mL/min signify possible Chronic Kidney Disease.   . Anion gap 11/07/2016 9  5 - 15 Final  . Prothrombin Time 11/07/2016 12.0  11.4 - 15.2 seconds Final  . INR 11/07/2016 0.89   Final  . ABO/RH(D) 11/07/2016 B POS   Final  . Antibody Screen 11/07/2016 NEG   Final  . Sample Expiration 11/07/2016 11/17/2016   Final  . Extend sample reason 11/07/2016 NO TRANSFUSIONS OR PREGNANCY IN THE PAST 3 MONTHS   Final  . MRSA, PCR 11/07/2016 NEGATIVE  NEGATIVE Final  . Staphylococcus aureus 11/07/2016 POSITIVE* NEGATIVE Final   Comment: (NOTE) The Xpert SA Assay (FDA approved for NASAL specimens in patients 70 years of age and older), is one component of a comprehensive surveillance program. It is not intended to diagnose infection nor to guide or monitor treatment.   . ABO/RH(D) 11/07/2016 B POS   Final  Admission on 10/19/2016, Discharged on 10/20/2016  Component Date Value Ref Range Status  . WBC 10/19/2016 6.1  4.0 - 10.5 K/uL Final  . RBC 10/19/2016 3.89  3.87 - 5.11 MIL/uL Final  . Hemoglobin 10/19/2016 12.5  12.0 - 15.0 g/dL Final  . HCT 10/19/2016 38.0  36.0 - 46.0 % Final  . MCV 10/19/2016 97.7  78.0 - 100.0 fL Final  . MCH 10/19/2016 32.1  26.0 - 34.0 pg Final  . MCHC 10/19/2016 32.9  30.0 - 36.0 g/dL Final  . RDW 10/19/2016 14.1  11.5 - 15.5 % Final  . Platelets 10/19/2016 215  150 - 400 K/uL Final  . Creatinine, Ser 10/19/2016 0.69  0.44 - 1.00 mg/dL Final  . GFR calc non Af Amer 10/19/2016 >60  >60 mL/min Final  . GFR calc Af Amer 10/19/2016 >60  >60 mL/min Final   Comment: (NOTE) The eGFR has been calculated using  the CKD EPI equation. This calculation has not been validated in all clinical situations. eGFR's persistently <60 mL/min signify possible Chronic Kidney Disease.   Hospital Outpatient Visit on 10/10/2016  Component Date Value Ref Range Status  . aPTT 10/10/2016 27  24 - 36 seconds Final  .  WBC 10/10/2016 5.4  4.0 - 10.5 K/uL Final  . RBC 10/10/2016 4.13  3.87 - 5.11 MIL/uL Final  . Hemoglobin 10/10/2016 13.2  12.0 - 15.0 g/dL Final  . HCT 10/10/2016 40.3  36.0 - 46.0 % Final  . MCV 10/10/2016 97.6  78.0 - 100.0 fL Final  . MCH 10/10/2016 32.0  26.0 - 34.0 pg Final  . MCHC 10/10/2016 32.8  30.0 - 36.0 g/dL Final  . RDW 10/10/2016 14.3  11.5 - 15.5 % Final  . Platelets 10/10/2016 243  150 - 400 K/uL Final  . Neutrophils Relative % 10/10/2016 38  % Final  . Neutro Abs 10/10/2016 2.0  1.7 - 7.7 K/uL Final  . Lymphocytes Relative 10/10/2016 50  % Final  . Lymphs Abs 10/10/2016 2.7  0.7 - 4.0 K/uL Final  . Monocytes Relative 10/10/2016 8  % Final  . Monocytes Absolute 10/10/2016 0.4  0.1 - 1.0 K/uL Final  . Eosinophils Relative 10/10/2016 4  % Final  . Eosinophils Absolute 10/10/2016 0.2  0.0 - 0.7 K/uL Final  . Basophils Relative 10/10/2016 0  % Final  . Basophils Absolute 10/10/2016 0.0  0.0 - 0.1 K/uL Final  . Sodium 10/10/2016 142  135 - 145 mmol/L Final  . Potassium 10/10/2016 4.8  3.5 - 5.1 mmol/L Final  . Chloride 10/10/2016 109  101 - 111 mmol/L Final  . CO2 10/10/2016 26  22 - 32 mmol/L Final  . Glucose, Bld 10/10/2016 92  65 - 99 mg/dL Final  . BUN 10/10/2016 18  6 - 20 mg/dL Final  . Creatinine, Ser 10/10/2016 0.79  0.44 - 1.00 mg/dL Final  . Calcium 10/10/2016 9.6  8.9 - 10.3 mg/dL Final  . Total Protein 10/10/2016 6.6  6.5 - 8.1 g/dL Final  . Albumin 10/10/2016 3.9  3.5 - 5.0 g/dL Final  . AST 10/10/2016 23  15 - 41 U/L Final  . ALT 10/10/2016 19  14 - 54 U/L Final  . Alkaline Phosphatase 10/10/2016 93  38 - 126 U/L Final  . Total Bilirubin 10/10/2016 0.5  0.3 - 1.2  mg/dL Final  . GFR calc non Af Amer 10/10/2016 >60  >60 mL/min Final  . GFR calc Af Amer 10/10/2016 >60  >60 mL/min Final   Comment: (NOTE) The eGFR has been calculated using the CKD EPI equation. This calculation has not been validated in all clinical situations. eGFR's persistently <60 mL/min signify possible Chronic Kidney Disease.   . Anion gap 10/10/2016 7  5 - 15 Final  . Prothrombin Time 10/10/2016 12.5  11.4 - 15.2 seconds Final  . INR 10/10/2016 0.93   Final  . Color, Urine 10/10/2016 YELLOW  YELLOW Final  . APPearance 10/10/2016 HAZY* CLEAR Final  . Specific Gravity, Urine 10/10/2016 1.021  1.005 - 1.030 Final  . pH 10/10/2016 6.0  5.0 - 8.0 Final  . Glucose, UA 10/10/2016 NEGATIVE  NEGATIVE mg/dL Final  . Hgb urine dipstick 10/10/2016 NEGATIVE  NEGATIVE Final  . Bilirubin Urine 10/10/2016 NEGATIVE  NEGATIVE Final  . Ketones, ur 10/10/2016 5* NEGATIVE mg/dL Final  . Protein, ur 10/10/2016 NEGATIVE  NEGATIVE mg/dL Final  . Nitrite 10/10/2016 NEGATIVE  NEGATIVE Final  . Leukocytes, UA 10/10/2016 TRACE* NEGATIVE Final  . RBC / HPF 10/10/2016 0-5  0 - 5 RBC/hpf Final  . WBC, UA 10/10/2016 0-5  0 - 5 WBC/hpf Final  . Bacteria, UA 10/10/2016 RARE* NONE SEEN Final  . Squamous Epithelial / LPF 10/10/2016 6-30* NONE SEEN Final  X-Rays:No results found.  EKG: Orders placed or performed during the hospital encounter of 11/07/16  . EKG 12 lead  . EKG 12 lead     Hospital Course: Pam [name] is a 71 y.o. who was admitted to [facility]. They were brought to the operating room on 11/14/2016 and underwent Procedure(s): RIGHT TOTAL KNEE ARTHROPLASTY.  Patient tolerated the procedure well and was later transferred to the recovery room and then to the orthopaedic floor for postoperative care.  They were given PO and IV analgesics for pain control following their surgery.  They were given 24 hours of postoperative antibiotics of  Anti-infectives    Start     Dose/Rate  Route Frequency Ordered Stop   11/14/16 1300  ceFAZolin (ANCEF) IVPB 2g/100 mL premix     2 g 200 mL/hr over 30 Minutes Intravenous Every 6 hours 11/14/16 1051 11/14/16 2007   11/14/16 0526  ceFAZolin (ANCEF) IVPB 2g/100 mL premix     2 g 200 mL/hr over 30 Minutes Intravenous On call to O.R. 11/14/16 [id number] 11/14/16 0723   11/14/16 0517  ceFAZolin (ANCEF) 2-4 GM/100ML-% IVPB    Comments:  Waldron Session   : cabinet override      11/14/16 [phone] 11/14/16 0723     and started on DVT prophylaxis in the form of Xarelto.   PT and OT were ordered for total joint protocol.  Discharge planning consulted to help with postop disposition and equipment needs.  Patient had a tough night on the evening of surgery with pain in her low back.  Previous sciatic issues.  Started on a dosepack.  They started to get up OOB with therapy on day one. Hemovac drain was pulled without difficulty.  Continued to work with therapy into day two.  Dressing was changed on day two and the incision was healing well.   Patient was seen in rounds on day two and was ready to go home.   Discharge home with home health Diet - Regular diet Follow up - in 2 weeks Activity - WBAT Disposition - Home Condition Upon Discharge - Stable D/C Meds - See DC Summary DVT Prophylaxis - Xarelto   Discharge Instructions    Call MD / Call 911    Complete by:  As directed    If you experience chest pain or shortness of breath, CALL 911 and be transported to the hospital emergency room.  If you develope a fever above 101 F, pus (white drainage) or increased drainage or redness at the wound, or calf pain, call your surgeon's office.   Change dressing    Complete by:  As directed    Change dressing daily with sterile 4 x 4 inch gauze dressing and apply TED hose. Do not submerge the incision under water.   Constipation Prevention    Complete by:  As directed    Drink plenty of fluids.  Prune juice may be helpful.  You may use a stool softener,  such as Colace (over the counter) 100 mg twice a day.  Use MiraLax (over the counter) for constipation as needed.   Diet - low sodium heart healthy    Complete by:  As directed    Discharge instructions    Complete by:  As directed    Take Xarelto for two and a half more weeks, then discontinue Xarelto. Once the patient has completed the blood thinner regimen, then take a Baby 81 mg Aspirin daily for three more weeks.  Pick up stool softner and laxative for home use following surgery while on pain medications. Do not submerge incision under water. Please use good hand washing techniques while changing dressing each day. May shower starting three days after surgery. Please use a clean towel to pat the incision dry following showers. Continue to use ice for pain and swelling after surgery. Do not use any lotions or creams on the incision until instructed by your surgeon.  Wear both TED hose on both legs during the day every day for three weeks, but may remove the TED hose at night at home.  Postoperative Constipation Protocol  Constipation - defined medically as fewer than three stools per week and severe constipation as less than one stool per week.  One of the most common issues patients have following surgery is constipation.  Even if you have a regular bowel pattern at home, your normal regimen is likely to be disrupted due to multiple reasons following surgery.  Combination of anesthesia, postoperative narcotics, change in appetite and fluid intake all can affect your bowels.  In order to avoid complications following surgery, here are some recommendations in order to help you during your recovery period.  Colace (docusate) - Pick up an over-the-counter form of Colace or another stool softener and take twice a day as long as you are requiring postoperative pain medications.  Take with a full glass of water daily.  If you experience loose stools or diarrhea, hold the colace until you  stool forms back up.  If your symptoms do not get better within 1 week or if they get worse, check with your doctor.  Dulcolax (bisacodyl) - Pick up over-the-counter and take as directed by the product packaging as needed to assist with the movement of your bowels.  Take with a full glass of water.  Use this product as needed if not relieved by Colace only.   MiraLax (polyethylene glycol) - Pick up over-the-counter to have on hand.  MiraLax is a solution that will increase the amount of water in your bowels to assist with bowel movements.  Take as directed and can mix with a glass of water, juice, soda, coffee, or tea.  Take if you go more than two days without a movement. Do not use MiraLax more than once per day. Call your doctor if you are still constipated or irregular after using this medication for 7 days in a row.  If you continue to have problems with postoperative constipation, please contact the office for further assistance and recommendations.  If you experience "the worst abdominal pain ever" or develop nausea or vomiting, please contact the office immediatly for further recommendations for treatment.   Do not put a pillow under the knee. Place it under the heel.    Complete by:  As directed    Do not sit on low chairs, stoools or toilet seats, as it may be difficult to get up from low surfaces    Complete by:  As directed    Driving restrictions    Complete by:  As directed    No driving until released by the physician.   Increase activity slowly as tolerated    Complete by:  As directed    Lifting restrictions    Complete by:  As directed    No lifting until released by the physician.   Patient may shower    Complete by:  As directed    You may shower without a dressing once there is  no drainage.  Do not wash over the wound.  If drainage remains, do not shower until drainage stops.   TED hose    Complete by:  As directed    Use stockings (TED hose) for 3 weeks on both leg(s).   You may remove them at night for sleeping.   Weight bearing as tolerated    Complete by:  As directed    Laterality:  right   Extremity:  Lower     Allergies as of 11/15/2016      Reactions   Codeine Nausea And Vomiting   Macrodantin    CAUSES GERD      Medication List    STOP taking these medications   cyanocobalamin 500 MCG tablet   Fish Oil 1000 MG Caps   naproxen sodium 220 MG tablet Commonly known as:  ANAPROX   VITAMIN D3 PO     TAKE these medications   acetaminophen 500 MG tablet Commonly known as:  TYLENOL Take 1,000 mg by mouth every 8 (eight) hours as needed for mild pain or headache.   ALPRAZolam 0.25 MG tablet Commonly known as:  XANAX TAKE 1 TABLET BY MOUTH AT BEDTIME AS NEEDED What changed:  See the new instructions.   atorvastatin 20 MG tablet Commonly known as:  LIPITOR Take 20 mg by mouth daily.   FLUoxetine 20 MG tablet Commonly known as:  PROZAC Take 40 mg by mouth daily.   HYDROmorphone 2 MG tablet Commonly known as:  DILAUDID Take 1-2 tablets (2-4 mg total) by mouth every 4 (four) hours as needed for severe pain. What changed:  how much to take  reasons to take this   Magnesium Oxide 250 MG Tabs Take 250 mg by mouth every other day.   methocarbamol 500 MG tablet Commonly known as:  ROBAXIN Take 1 tablet (500 mg total) by mouth every 6 (six) hours as needed for muscle spasms.   methylPREDNISolone 4 MG Tbpk tablet Commonly known as:  MEDROL DOSEPAK Take as directed Start taking on:  11/17/2016   rivaroxaban 10 MG Tabs tablet Commonly known as:  XARELTO Take 1 tablet (10 mg total) by mouth daily with breakfast. Take Xarelto for two and a half more weeks following discharge from the hospital, then discontinue Xarelto. Once the patient has completed the blood thinner regimen, then take a Baby 81 mg Aspirin daily for three more weeks.   traMADol 50 MG tablet Commonly known as:  ULTRAM Take 1-2 tablets (50-100 mg total) by mouth  every 6 (six) hours as needed for moderate pain. What changed:  how much to take  when to take this  reasons to take this   ZYRTEC PO Take 1 tablet by mouth daily as needed (allergies).            Durable Medical Equipment        Start     Ordered   11/15/16 1102  For home use only DME 3 n 1  Once     11/15/16 1101       Discharge Care Instructions        Start     Ordered   11/17/16 0000  methylPREDNISolone (MEDROL DOSEPAK) 4 MG TBPK tablet    Question:  Supervising Provider  Answer:  Gaynelle Arabian   11/15/16 2141   11/16/16 0000  rivaroxaban (XARELTO) 10 MG TABS tablet  Daily with breakfast    Question:  Supervising Provider  Answer:  Gaynelle Arabian   11/15/16 2141  11/15/16 0000  HYDROmorphone (DILAUDID) 2 MG tablet  Every 4 hours PRN    Question:  Supervising Provider  Answer:  Gaynelle Arabian   11/15/16 2141   11/15/16 0000  methocarbamol (ROBAXIN) 500 MG tablet  Every 6 hours PRN    Question:  Supervising Provider  Answer:  Gaynelle Arabian   11/15/16 2141   11/15/16 0000  traMADol (ULTRAM) 50 MG tablet  Every 6 hours PRN    Question:  Supervising Provider  Answer:  Gaynelle Arabian   11/15/16 2141   11/15/16 0000  Call MD / Call 911    Comments:  If you experience chest pain or shortness of breath, CALL 911 and be transported to the hospital emergency room.  If you develope a fever above 101 F, pus (white drainage) or increased drainage or redness at the wound, or calf pain, call your surgeon's office.   11/15/16 2141   11/15/16 0000  Discharge instructions    Comments:  Take Xarelto for two and a half more weeks, then discontinue Xarelto. Once the patient has completed the blood thinner regimen, then take a Baby 81 mg Aspirin daily for three more weeks.   Pick up stool softner and laxative for home use following surgery while on pain medications. Do not submerge incision under water. Please use good hand washing techniques while changing dressing each  day. May shower starting three days after surgery. Please use a clean towel to pat the incision dry following showers. Continue to use ice for pain and swelling after surgery. Do not use any lotions or creams on the incision until instructed by your surgeon.  Wear both TED hose on both legs during the day every day for three weeks, but may remove the TED hose at night at home.  Postoperative Constipation Protocol  Constipation - defined medically as fewer than three stools per week and severe constipation as less than one stool per week.  One of the most common issues patients have following surgery is constipation.  Even if you have a regular bowel pattern at home, your normal regimen is likely to be disrupted due to multiple reasons following surgery.  Combination of anesthesia, postoperative narcotics, change in appetite and fluid intake all can affect your bowels.  In order to avoid complications following surgery, here are some recommendations in order to help you during your recovery period.  Colace (docusate) - Pick up an over-the-counter form of Colace or another stool softener and take twice a day as long as you are requiring postoperative pain medications.  Take with a full glass of water daily.  If you experience loose stools or diarrhea, hold the colace until you stool forms back up.  If your symptoms do not get better within 1 week or if they get worse, check with your doctor.  Dulcolax (bisacodyl) - Pick up over-the-counter and take as directed by the product packaging as needed to assist with the movement of your bowels.  Take with a full glass of water.  Use this product as needed if not relieved by Colace only.   MiraLax (polyethylene glycol) - Pick up over-the-counter to have on hand.  MiraLax is a solution that will increase the amount of water in your bowels to assist with bowel movements.  Take as directed and can mix with a glass of water, juice, soda, coffee, or tea.  Take if  you go more than two days without a movement. Do not use MiraLax more than once per day.  Call your doctor if you are still constipated or irregular after using this medication for 7 days in a row.  If you continue to have problems with postoperative constipation, please contact the office for further assistance and recommendations.  If you experience "the worst abdominal pain ever" or develop nausea or vomiting, please contact the office immediatly for further recommendations for treatment.   11/15/16 2141   11/15/16 0000  Diet - low sodium heart healthy     11/15/16 2141   11/15/16 0000  Constipation Prevention    Comments:  Drink plenty of fluids.  Prune juice may be helpful.  You may use a stool softener, such as Colace (over the counter) 100 mg twice a day.  Use MiraLax (over the counter) for constipation as needed.   11/15/16 2141   11/15/16 0000  Increase activity slowly as tolerated     11/15/16 2141   11/15/16 0000  Patient may shower    Comments:  You may shower without a dressing once there is no drainage.  Do not wash over the wound.  If drainage remains, do not shower until drainage stops.   11/15/16 2141   11/15/16 0000  Driving restrictions    Comments:  No driving until released by the physician.   11/15/16 2141   11/15/16 0000  Lifting restrictions    Comments:  No lifting until released by the physician.   11/15/16 2141   11/15/16 0000  TED hose    Comments:  Use stockings (TED hose) for 3 weeks on both leg(s).  You may remove them at night for sleeping.   11/15/16 2141   11/15/16 0000  Change dressing    Comments:  Change dressing daily with sterile 4 x 4 inch gauze dressing and apply TED hose. Do not submerge the incision under water.   11/15/16 2141   11/15/16 0000  Do not put a pillow under the knee. Place it under the heel.     11/15/16 2141   11/15/16 0000  Do not sit on low chairs, stoools or toilet seats, as it may be difficult to get up from low surfaces      11/15/16 2141   11/15/16 0000  Weight bearing as tolerated    Question Answer Comment  Laterality right   Extremity Lower      11/15/16 2141     Follow-up Information    [facility] up.   Specialty:  Home Health Services Why:  physical therapy Contact information: [address] [place] [address] [place] Follow up.   Why:  bedside commode Contact information: [address] [place] [address] [phone]        Gaynelle Arabian, MD. Schedule an appointment as soon as possible for a visit on 11/29/2016.   Specialty:  Orthopedic Surgery Contact information: [address] [place] [address] [phone]           Signed: Arlee Muslim, PA-C Orthopaedic Surgery 11/15/2016, 9:42 PM

## 2016-11-15 NOTE — Progress Notes (Signed)
Discharge planning, spoke with patient and spouse at bedside. Have chosen [facility] for [facility] PT. Contacted [facility] for referral. Has RW but needs 3n1, contacted [facility] to deliver to room. [phone]

## 2016-11-15 NOTE — Progress Notes (Signed)
   Subjective: 1 Day Post-Op Procedure(s) (LRB): RIGHT TOTAL KNEE ARTHROPLASTY (Right) Patient reports pain as moderate int he low back area.  Previous history of sciatica issues. Patient seen in rounds for Dr. Wynelle Link. Patient is having problems with pain in the low back, requiring pain medications We will start therapy today. Also give her a steroid dosepack for sciatica flare. Plan is to go Home after hospital stay.  Objective: Vital signs in last 24 hours: Temp:  [98 F (36.7 C)-98.8 F (37.1 C)] 98.6 F (37 C) (09/25 0653) Pulse Rate:  [61-81] 63 (09/25 0653) Resp:  [16] 16 (09/25 0653) BP: (96-130)/(52-67) 122/60 (09/25 0653) SpO2:  [95 %-100 %] 95 % (09/25 0653) Weight:  [69.2 kg (152 lb 8 oz)] 69.2 kg (152 lb 8 oz) (09/24 1209)  Intake/Output from previous day:  Intake/Output Summary (Last 24 hours) at 11/15/16 1035 Last data filed at 11/15/16 0904  Gross per 24 hour  Intake          2966.25 ml  Output           3652.5 ml  Net          -686.25 ml    Intake/Output this shift: Total I/O In: 240 [P.O.:240] Out: -   Labs:  Recent Labs  11/15/16 0540  HGB 10.8*    Recent Labs  11/15/16 0540  WBC 11.4*  RBC 3.31*  HCT 32.1*  PLT 234    Recent Labs  11/15/16 0540  NA 143  K 4.1  CL 109  CO2 26  BUN 9  CREATININE 0.52  GLUCOSE 140*  CALCIUM 9.2   No results for input(s): LABPT, INR in the last 72 hours.  EXAM General - Patient is Alert, Appropriate and Oriented Extremity - Neurovascular intact Sensation intact distally Intact pulses distally Dorsiflexion/Plantar flexion intact Dressing - dressing C/D/I Motor Function - intact, moving foot and toes well on exam.  Hemovac pulled without difficulty.  Past Medical History:  Diagnosis Date  . Anxiety   . Arthritis   . Costochondritis   . GERD (gastroesophageal reflux disease)    Had surgery for  . Osteopenia 04/2016   T score -2.0 FRAX 6%/1.2%  . PONV (postoperative nausea and vomiting)      Assessment/Plan: 1 Day Post-Op Procedure(s) (LRB): RIGHT TOTAL KNEE ARTHROPLASTY (Right) Principal Problem:   OA (osteoarthritis) of knee  Estimated body mass index is 28.81 kg/m as calculated from the following:   Height as of this encounter: 5\' 1"  (1.549 m).   Weight as of this encounter: 69.2 kg (152 lb 8 oz). Advance diet Up with therapy Discharge home with home health  DVT Prophylaxis - Xarelto Weight-Bearing as tolerated to right leg D/C O2 and Pulse OX and try on Room Air  Arlee Muslim, PA-C Orthopaedic Surgery 11/15/2016, 10:35 AM

## 2016-11-16 LAB — BASIC METABOLIC PANEL
ANION GAP: 10 (ref 5–15)
BUN: 14 mg/dL (ref 6–20)
CALCIUM: 9.2 mg/dL (ref 8.9–10.3)
CO2: 26 mmol/L (ref 22–32)
CREATININE: 0.58 mg/dL (ref 0.44–1.00)
Chloride: 104 mmol/L (ref 101–111)
Glucose, Bld: 144 mg/dL — ABNORMAL HIGH (ref 65–99)
Potassium: 4 mmol/L (ref 3.5–5.1)
SODIUM: 140 mmol/L (ref 135–145)

## 2016-11-16 LAB — CBC
HEMATOCRIT: 35 % — AB (ref 36.0–46.0)
Hemoglobin: 11.3 g/dL — ABNORMAL LOW (ref 12.0–15.0)
MCH: 30.7 pg (ref 26.0–34.0)
MCHC: 32.3 g/dL (ref 30.0–36.0)
MCV: 95.1 fL (ref 78.0–100.0)
Platelets: 271 10*3/uL (ref 150–400)
RBC: 3.68 MIL/uL — ABNORMAL LOW (ref 3.87–5.11)
RDW: 14.3 % (ref 11.5–15.5)
WBC: 13.8 10*3/uL — AB (ref 4.0–10.5)

## 2016-11-16 NOTE — Progress Notes (Signed)
   Subjective: 2 Days Post-Op Procedure(s) (LRB): RIGHT TOTAL KNEE ARTHROPLASTY (Right) Patient reports pain as mild.   Patient seen in rounds with Dr. Wynelle Link. Patient is well, but has had some minor complaints of pain in the knee, requiring pain medications  Continue steroids at home Patient is ready to go home  Objective: Vital signs in last 24 hours: Temp:  [97.9 F (36.6 C)-98.6 F (37 C)] 98.2 F (36.8 C) (09/26 0529) Pulse Rate:  [63-78] 76 (09/26 0529) Resp:  [16-18] 18 (09/26 0529) BP: (120-131)/(60-92) 131/92 (09/26 0529) SpO2:  [95 %-98 %] 98 % (09/26 0529)  Intake/Output from previous day:  Intake/Output Summary (Last 24 hours) at 11/16/16 0651 Last data filed at 11/15/16 1856  Gross per 24 hour  Intake              720 ml  Output             1250 ml  Net             -530 ml    Intake/Output this shift: No intake/output data recorded.  Labs:  Recent Labs  11/15/16 0540 11/16/16 0527  HGB 10.8* 11.3*    Recent Labs  11/15/16 0540 11/16/16 0527  WBC 11.4* 13.8*  RBC 3.31* 3.68*  HCT 32.1* 35.0*  PLT 234 271    Recent Labs  11/15/16 0540 11/16/16 0527  NA 143 140  K 4.1 4.0  CL 109 104  CO2 26 26  BUN 9 14  CREATININE 0.52 0.58  GLUCOSE 140* 144*  CALCIUM 9.2 9.2   No results for input(s): LABPT, INR in the last 72 hours.  EXAM: General - Patient is Alert and Appropriate Extremity - Neurovascular intact Sensation intact distally Incision - clean, dry Motor Function - intact, moving foot and toes well on exam.   Assessment/Plan: 2 Days Post-Op Procedure(s) (LRB): RIGHT TOTAL KNEE ARTHROPLASTY (Right) Procedure(s) (LRB): RIGHT TOTAL KNEE ARTHROPLASTY (Right) Past Medical History:  Diagnosis Date  . Anxiety   . Arthritis   . Costochondritis   . GERD (gastroesophageal reflux disease)    Had surgery for  . Osteopenia 04/2016   T score -2.0 FRAX 6%/1.2%  . PONV (postoperative nausea and vomiting)    Principal Problem:   OA  (osteoarthritis) of knee  Estimated body mass index is 28.81 kg/m as calculated from the following:   Height as of this encounter: 5\' 1"  (1.549 m).   Weight as of this encounter: 69.2 kg (152 lb 8 oz). Discharge home with home health Diet - Regular diet Follow up - in 2 weeks Activity - WBAT Disposition - Home Condition Upon Discharge - Stable D/C Meds - See DC Summary DVT Prophylaxis - Xarelto  Arlee Muslim, PA-C Orthopaedic Surgery 11/16/2016, 6:51 AM

## 2016-11-16 NOTE — Progress Notes (Signed)
Physical Therapy Treatment Patient Details Name: Pam [name] MRN: [id number] DOB: 27-Dec-1945 Today's Date: 11/16/2016    History of Present Illness   R TKA 11/14/16; bursectomy R hip 10/20/16    PT Comments    POD # 1 pm session Assisted OOB to amb in hallway, assisted to bathroom then back to bed for CPM.   Follow Up Recommendations   HH PT     Equipment Recommendations    3in 1   Recommendations for Other Services       Precautions / Restrictions   Precautions Precautions: Knee Precaution Comments: reviewed no pillow under knee Restrictions Weight Bearing Restrictions: No    Mobility  Bed Mobility   Overal bed mobility: Modified Independent General bed mobility comments: HOB up                Transfers   Overall transfer level: Needs assistance Equipment used: Rolling walker (2 wheeled) Transfers: Sit to/from Stand Sit to Stand: [employer] transfer comment: min A to rise                  Ambulation/Gait    Ambulation/Gait assistance: Physicist, medical (Feet): 65 Feet Assistive device: Rolling walker (2 wheeled) Gait Pattern/deviations: Step-to pattern;Decreased step length - right;Decreased step length - left;Decreased weight shift to right Gait velocity interpretation: Below normal speed for age/gender General Gait Details: VCs sequencing, no LOB         Stairs            Wheelchair Mobility    Modified Rankin (Stroke Patients Only)       Balance                                            Cognition                                              Exercises      General Comments        Pertinent Vitals/Pain   Pain Assessment: 0-10 Pain Score: 3  Pain Location: R knee Pain Descriptors / Indicators: Sore Pain Intervention(s): Limited activity within patient's tolerance;Monitored during session;Premedicated before session;Ice applied    Home Living    Family/patient expects to be discharged to:: Private residence Living Arrangements: Spouse/significant other Available Help at Discharge: Family Type of Home: House Home Access: Level entry Home Layout: One level Home Equipment: Environmental consultant - 2 wheels                    Prior Function            PT Goals (current goals can now be found in the care plan section)   Acute Rehab PT Goals Patient Stated Goal: to walk PT Goal Formulation: With patient Time For Goal Achievement: 11/21/16 Potential to Achieve Goals: Good    Frequency     7X/week      PT Plan      Co-evaluation              AM-PAC PT "6 Clicks" Daily Activity  Outcome Measure    Difficulty turning over in bed (including adjusting bedclothes, sheets and blankets)?: None Difficulty moving from lying on back to sitting on  the side of the bed? : A Little Difficulty sitting down on and standing up from a chair with arms (e.g., wheelchair, bedside commode, etc,.)?: Unable Help needed moving to and from a bed to chair (including a wheelchair)?: A Little Help needed walking in hospital room?: A Little Help needed climbing 3-5 steps with a railing? : A Lot 6 Click Score: 16                 End of Session   Equipment Utilized During Treatment: Gait belt Activity Tolerance: Patient tolerated treatment well Patient left: in chair;with call bell/phone within reach;with chair alarm set Nurse Communication: Mobility status PT Visit Diagnosis: Difficulty in walking, not elsewhere classified (R26.2);Muscle weakness (generalized) (M62.81);Pain Pain - Right/Left: Right Pain - part of body: Knee             Time: 14:37  -  14:50    Charges:    1 gt                   G Codes:       Rica Koyanagi  PTA [facility]  Acute  Rehab Pager      [phone]

## 2016-11-16 NOTE — Progress Notes (Signed)
Patient discharged to home w/ family. Given all belongings, instructions, prescriptions. Husband present for all teaching. Verbalized understanding of all instructions. Escorted to POV via w/c.

## 2016-11-16 NOTE — Progress Notes (Signed)
Occupational Therapy Treatment Patient Details Name: Pam [name] MRN: [id number] DOB: 06/12/1945 Today's Date: 11/16/2016    History of present illness R TKA 11/14/16; bursectomy R hip 10/20/16   OT comments  Pt progressing towards goals, completing room level functional mobility and standing grooming ADLs at RW level with MinGuard assist and verbal safety cues during task completion to ensure safe RW use. Educated Pt on compensatory techniques for completing ADLs and functional mobility transfers. Questions answered throughout. Will continue to follow acutely to progress Pt's safety and independence with ADLs and functional mobility prior to return home.    Follow Up Recommendations  DC plan and follow up therapy as arranged by surgeon;Supervision/Assistance - 24 hour    Equipment Recommendations  3 in 1 bedside commode          Precautions / Restrictions Precautions Precautions: Knee Precaution Comments: reviewed no pillow under knee Restrictions Weight Bearing Restrictions: No Other Position/Activity Restrictions: WBAT        Mobility Bed Mobility Overal bed mobility: Needs Assistance Bed Mobility: Sit to Supine;Supine to Sit     Supine to sit: Supervision;HOB elevated Sit to supine: Supervision   General bed mobility comments: for safety   Transfers Overall transfer level: Needs assistance Equipment used: Rolling walker (2 wheeled) Transfers: Sit to/from Stand Sit to Stand: Min guard         General transfer comment: close guard for safety                                                ADL either performed or assessed with clinical judgement   ADL Overall ADL's : Needs assistance/impaired     Grooming: Min guard;Wash/dry hands;Standing Grooming Details (indicate cue type and reason): verbal cues for safe RW positioning                Lower Body Dressing Details (indicate cue type and reason): educated on compensatory  techniques for completing task Toilet Transfer: Min guard;Ambulation;BSC;RW;Cueing for safety Toilet Transfer Details (indicate cue type and reason): BSC over toilet; verbal cues for safety as Pt had bucket placed under RLE for added support while sitting on BSC, attempts to stand while LE still resting on bucket, reinforced moving bucket prior to standing to ensure safety   Toileting- Clothing Manipulation and Hygiene: Sit to/from stand;Min guard     Tub/Shower Transfer Details (indicate cue type and reason): educated on use of 3:1 as shower chair in tub; educated on completing sponge bath until Pt feels she is able to safely step into tub with spouse assist; educated on modified tub transfer as well i.e. placing 3:1 so that Pt is able to sit while LEs rest over ledge of tub without having to step over, Pt verbalizes understanding  Functional mobility during ADLs: Min guard;Rolling walker General ADL Comments: Pt requires min verbal safety cues throughout session for safe RW use during functional mobility; educated on compensatory techniques for completing ADLs and functional mobility transfers; Pt reports feeling nauseous/feverish after toileting and returning to recliner, vitals taken: BP 130/59, temperature 98.4, pulse 79; Pt returned to supine in bed end of session and made comfortable, reporting symptoms subsiding with rest                        Cognition Arousal/Alertness: Awake/alert Behavior During Therapy: [facility] for  tasks assessed/performed Overall Cognitive Status: Within Functional Limits for tasks assessed                                 General Comments: min verbal safety cues during functional task completion                           Pertinent Vitals/ Pain       Pain Assessment: Faces Faces Pain Scale: Hurts little more Pain Location: R knee Pain Descriptors / Indicators: Sore Pain Intervention(s): Limited activity within patient's  tolerance;Monitored during session;Ice applied                                                          Frequency  Min 2X/week        Progress Toward Goals  OT Goals(current goals can now be found in the care plan section)  Progress towards OT goals: Progressing toward goals  Acute Rehab OT Goals Patient Stated Goal: to walk; regain independence  OT Goal Formulation: With patient Time For Goal Achievement: 11/22/16 Potential to Achieve Goals: Good  Plan Discharge plan remains appropriate                     AM-PAC PT "6 Clicks" Daily Activity     Outcome Measure   Help from another person eating meals?: None Help from another person taking care of personal grooming?: A Little Help from another person toileting, which includes using toliet, bedpan, or urinal?: A Little Help from another person bathing (including washing, rinsing, drying)?: A Lot Help from another person to put on and taking off regular upper body clothing?: None Help from another person to put on and taking off regular lower body clothing?: A Lot 6 Click Score: 18    End of Session Equipment Utilized During Treatment: Rolling walker  OT Visit Diagnosis: Other abnormalities of gait and mobility (R26.89)   Activity Tolerance Patient tolerated treatment well   Patient Left in bed;with call bell/phone within reach;with bed alarm set   Nurse Communication Mobility status        Time: 6213-0865 OT Time Calculation (min): 26 min  Charges: OT General Charges $OT Visit: 1 Visit OT Treatments $Self Care/Home Management : 8-22 mins  Lou Cal, OT Pager [phone] 11/16/2016    Raymondo Band 11/16/2016, 9:47 AM

## 2016-11-16 NOTE — Progress Notes (Signed)
Physical Therapy Treatment Patient Details Name: Pam [name] MRN: [id number] DOB: [birth year] Today's Date: 11/16/2016    History of Present Illness  R TKR    PT Comments    POD #1 Am session Assisted OOB to amb in hallway then performed TKR TE's followed by ICE  Follow Up Recommendations   Home Health     Equipment Recommendations    3:1   Recommendations for Other Services       Precautions / Restrictions   knee WBAT   Mobility  Bed Mobility      Min Assist to support R LE and increased time with 25% VC's on proper tech            Transfers      Min/MinGuard Assist with 25% VC's on hand placement and increased time and safety with turns               Ambulation/Gait      Min Guard Assist with 25% VC's on safety with turns           Stairs            Wheelchair Mobility    Modified Rankin (Stroke Patients Only)             Exercises   Total Knee Replacement TE's 10 reps B LE ankle pumps 10 reps towel squeezes 10 reps knee presses 10 reps heel slides  10 reps SAQ's 10 reps SLR's 10 reps ABD Followed by ICE     General Comments  progressing well           End of Session               Time:10:05 - 10:30    Charges:    1 gt   1 te                   G Codes:       Rica Koyanagi  PTA [facility]  Acute  Rehab Pager      [phone]

## 2016-11-17 DIAGNOSIS — Z96651 Presence of right artificial knee joint: Secondary | ICD-10-CM | POA: Diagnosis not present

## 2016-11-17 DIAGNOSIS — M4726 Other spondylosis with radiculopathy, lumbar region: Secondary | ICD-10-CM | POA: Diagnosis not present

## 2016-11-17 DIAGNOSIS — F419 Anxiety disorder, unspecified: Secondary | ICD-10-CM | POA: Diagnosis not present

## 2016-11-17 DIAGNOSIS — R269 Unspecified abnormalities of gait and mobility: Secondary | ICD-10-CM | POA: Diagnosis not present

## 2016-11-17 DIAGNOSIS — Z471 Aftercare following joint replacement surgery: Secondary | ICD-10-CM | POA: Diagnosis not present

## 2016-11-17 DIAGNOSIS — M880 Osteitis deformans of skull: Secondary | ICD-10-CM | POA: Diagnosis not present

## 2016-11-17 DIAGNOSIS — M5442 Lumbago with sciatica, left side: Secondary | ICD-10-CM | POA: Diagnosis not present

## 2016-11-17 DIAGNOSIS — M8580 Other specified disorders of bone density and structure, unspecified site: Secondary | ICD-10-CM | POA: Diagnosis not present

## 2016-11-18 DIAGNOSIS — F419 Anxiety disorder, unspecified: Secondary | ICD-10-CM | POA: Diagnosis not present

## 2016-11-18 DIAGNOSIS — M4726 Other spondylosis with radiculopathy, lumbar region: Secondary | ICD-10-CM | POA: Diagnosis not present

## 2016-11-18 DIAGNOSIS — M5442 Lumbago with sciatica, left side: Secondary | ICD-10-CM | POA: Diagnosis not present

## 2016-11-18 DIAGNOSIS — Z471 Aftercare following joint replacement surgery: Secondary | ICD-10-CM | POA: Diagnosis not present

## 2016-11-18 DIAGNOSIS — Z96651 Presence of right artificial knee joint: Secondary | ICD-10-CM | POA: Diagnosis not present

## 2016-11-18 DIAGNOSIS — M8580 Other specified disorders of bone density and structure, unspecified site: Secondary | ICD-10-CM | POA: Diagnosis not present

## 2016-11-21 DIAGNOSIS — M8580 Other specified disorders of bone density and structure, unspecified site: Secondary | ICD-10-CM | POA: Diagnosis not present

## 2016-11-21 DIAGNOSIS — M4726 Other spondylosis with radiculopathy, lumbar region: Secondary | ICD-10-CM | POA: Diagnosis not present

## 2016-11-21 DIAGNOSIS — F419 Anxiety disorder, unspecified: Secondary | ICD-10-CM | POA: Diagnosis not present

## 2016-11-21 DIAGNOSIS — Z96651 Presence of right artificial knee joint: Secondary | ICD-10-CM | POA: Diagnosis not present

## 2016-11-21 DIAGNOSIS — M5442 Lumbago with sciatica, left side: Secondary | ICD-10-CM | POA: Diagnosis not present

## 2016-11-21 DIAGNOSIS — Z471 Aftercare following joint replacement surgery: Secondary | ICD-10-CM | POA: Diagnosis not present

## 2016-11-23 DIAGNOSIS — M5442 Lumbago with sciatica, left side: Secondary | ICD-10-CM | POA: Diagnosis not present

## 2016-11-23 DIAGNOSIS — Z96651 Presence of right artificial knee joint: Secondary | ICD-10-CM | POA: Diagnosis not present

## 2016-11-23 DIAGNOSIS — M8580 Other specified disorders of bone density and structure, unspecified site: Secondary | ICD-10-CM | POA: Diagnosis not present

## 2016-11-23 DIAGNOSIS — M4726 Other spondylosis with radiculopathy, lumbar region: Secondary | ICD-10-CM | POA: Diagnosis not present

## 2016-11-23 DIAGNOSIS — F419 Anxiety disorder, unspecified: Secondary | ICD-10-CM | POA: Diagnosis not present

## 2016-11-23 DIAGNOSIS — Z471 Aftercare following joint replacement surgery: Secondary | ICD-10-CM | POA: Diagnosis not present

## 2016-11-25 DIAGNOSIS — Z96651 Presence of right artificial knee joint: Secondary | ICD-10-CM | POA: Diagnosis not present

## 2016-11-25 DIAGNOSIS — M8580 Other specified disorders of bone density and structure, unspecified site: Secondary | ICD-10-CM | POA: Diagnosis not present

## 2016-11-25 DIAGNOSIS — F419 Anxiety disorder, unspecified: Secondary | ICD-10-CM | POA: Diagnosis not present

## 2016-11-25 DIAGNOSIS — M4726 Other spondylosis with radiculopathy, lumbar region: Secondary | ICD-10-CM | POA: Diagnosis not present

## 2016-11-25 DIAGNOSIS — Z471 Aftercare following joint replacement surgery: Secondary | ICD-10-CM | POA: Diagnosis not present

## 2016-11-25 DIAGNOSIS — M5442 Lumbago with sciatica, left side: Secondary | ICD-10-CM | POA: Diagnosis not present

## 2016-11-30 ENCOUNTER — Encounter: Payer: Self-pay | Admitting: Physical Therapy

## 2016-11-30 ENCOUNTER — Ambulatory Visit: Payer: Medicare HMO | Attending: Orthopedic Surgery | Admitting: Physical Therapy

## 2016-11-30 DIAGNOSIS — R2241 Localized swelling, mass and lump, right lower limb: Secondary | ICD-10-CM

## 2016-11-30 DIAGNOSIS — M25661 Stiffness of right knee, not elsewhere classified: Secondary | ICD-10-CM | POA: Diagnosis not present

## 2016-11-30 DIAGNOSIS — R262 Difficulty in walking, not elsewhere classified: Secondary | ICD-10-CM | POA: Diagnosis not present

## 2016-11-30 DIAGNOSIS — M25561 Pain in right knee: Secondary | ICD-10-CM | POA: Diagnosis not present

## 2016-11-30 NOTE — Therapy (Signed)
[place], [place], [address] Phone: [phone]   Fax:  [phone]  Physical Therapy Evaluation  Patient Details  Name: Pam [name] MRN: [id number] Date of Birth: 01-29-1946 Referring Provider: Wynelle Link  Encounter Date: 11/30/2016      PT End of Session - 11/30/16 1318    Visit Number 1   Date for PT Re-Evaluation 01/30/17   PT Start Time 1255   PT Stop Time 1355   PT Time Calculation (min) 60 min   Activity Tolerance Patient tolerated treatment well   Behavior During Therapy [facility] for tasks assessed/performed      Past Medical History:  Diagnosis Date  . Anxiety   . Arthritis   . Costochondritis   . GERD (gastroesophageal reflux disease)    Had surgery for  . Osteopenia 04/2016   T score -2.0 FRAX 6%/1.2%  . PONV (postoperative nausea and vomiting)     Past Surgical History:  Procedure Laterality Date  . CHOLECYSTECTOMY    . ESOPHAGUS SURGERY  2008  . EXCISION/RELEASE BURSA HIP Right 10/19/2016   Procedure: Right hip bursectomy excision portion ileotibial band;  Surgeon: Latanya Maudlin, MD;  Location: [facility];  Service: Orthopedics;  Laterality: Right;  . KNEE SURGERY     Arthroscopic  right  . TOTAL KNEE ARTHROPLASTY Right 11/14/2016   Procedure: RIGHT TOTAL KNEE ARTHROPLASTY;  Surgeon: Gaynelle Arabian, MD;  Location: [facility];  Service: Orthopedics;  Laterality: Right;  . TUBAL LIGATION      There were no vitals filed for this visit.       Subjective Assessment - 11/30/16 1302    Subjective Patient underwent a right TKR on 11/14/16.  She also had a right GT bursectomy in August.  Patient reports that she had knee pain for a number of years, reports 3 arthroscopies previously.  She reports having 5 home PT visits.   Limitations Standing;Walking;House hold activities   Patient Stated Goals walk better, have less pain'   Currently in Pain? Yes   Pain Score 6   also pain in  the right SI area   Pain Location Knee   Pain Orientation Right   Pain Descriptors / Indicators Aching   Pain Type Acute pain;Surgical pain   Pain Onset 1 to 4 weeks ago   Pain Frequency Constant   Aggravating Factors  pain in the knee at times up to 9/10, reports back/tailbone pain with walking also a 9/10   Pain Relieving Factors ice rest pain meds, pain can be a 5/10 at best            [facility] PT Assessment - 11/30/16 0001      Assessment   Medical Diagnosis right TKR   Referring Provider Aluisio   Onset Date/Surgical Date 11/14/16   Prior Therapy home PT     Precautions   Precautions None     Balance Screen   Has the patient fallen in the past 6 months No   Has the patient had a decrease in activity level because of a fear of falling?  No   Is the patient reluctant to leave their home because of a fear of falling?  No     Home Environment   Additional Comments does housework and yardwork     Prior Function   Level of Independence Independent   Vocation Retired   Leisure gym a few times a week     Observation/Other Assessments-Edema  Edema Circumferential     Circumferential Edema   Circumferential - Right 43cm mid patella   Circumferential - Left  37.5 cm     ROM / Strength   AROM / PROM / Strength AROM;PROM;Strength     AROM   AROM Assessment Site Knee   Right/Left Knee Right   Right Knee Extension 25   Right Knee Flexion 81     PROM   PROM Assessment Site Knee   Right/Left Knee Right   Right Knee Extension 10   Right Knee Flexion 86     Strength   Overall Strength Comments 4/5 with some pain     Palpation   Palpation comment mild warmth, scar is tight mid to distally     Ambulation/Gait   Gait Comments with SPC. slow, stiff right leg, antalgic on the right            Objective measurements completed on examination: See above findings.          [facility] Adult PT Treatment/Exercise - 11/30/16 0001      Exercises   Exercises  Knee/Hip     Modalities   Modalities Moist Heat;Vasopneumatic     Moist Heat Therapy   Number Minutes Moist Heat 15 Minutes   Moist Heat Location Lumbar Spine     Vasopneumatic   Number Minutes Vasopneumatic  15 minutes   Vasopnuematic Location  Knee   Vasopneumatic Pressure Medium   Vasopneumatic Temperature  36                PT Education - 11/30/16 1317    Education provided Yes   Education Details RICE, LLLD stretches for flexion and extension   Person(s) Educated Patient   Methods Explanation;Demonstration;Handout   Comprehension Verbalized understanding          PT Short Term Goals - 11/30/16 1329      PT SHORT TERM GOAL #1   Title independent with initial ?HEP   Time 2   Period Weeks   Status New           PT Long Term Goals - 11/30/16 1330      PT LONG TERM GOAL #1   Title decrease pain 50%   Time 8   Period Weeks   Status New     PT LONG TERM GOAL #2   Title walk without device community distances   Time 8   Period Weeks   Status New     PT LONG TERM GOAL #3   Title increase AROM of the right knee to 5-115 degrees flexion   Time 8   Period Weeks   Status New     PT LONG TERM GOAL #4   Title go up and down stairs step over step   Time 8   Period Weeks   Status New                Plan - 11/30/16 1318    Clinical Impression Statement Patient underwent a right TKR on9/24/18, she also had a right bursectomy of the hip in August, she is having tailbone pain.  AROM 25-81 degrees flexion.  Swelling is about 5.5 cm bigger on the surgical side   History and Personal Factors relevant to plan of care: recent right hip bursectomy   Clinical Presentation Evolving   Clinical Presentation due to: recent surgery   Clinical Decision Making Low   Rehab Potential Good   PT Frequency 3x / week  PT Duration 8 weeks   PT Treatment/Interventions ADLs/Self Care Home Management;Cryotherapy;Electrical Stimulation;Moist Heat;Therapeutic  activities;Therapeutic exercise;Balance training;Neuromuscular re-education;Gait training;Stair training;Functional mobility training;Scar mobilization;Manual techniques;Vasopneumatic Device   PT Next Visit Plan start gym exercises and progress ROM as tolerated   Consulted and Agree with Plan of Care Patient      Patient will benefit from skilled therapeutic intervention in order to improve the following deficits and impairments:  Abnormal gait, Decreased activity tolerance, Decreased mobility, Decreased strength, Increased edema, Decreased scar mobility, Pain, Difficulty walking, Decreased range of motion  Visit Diagnosis: Acute pain of right knee - Plan: PT plan of care cert/re-cert  Stiffness of right knee, not elsewhere classified - Plan: PT plan of care cert/re-cert  Localized swelling, mass and lump, right lower limb - Plan: PT plan of care cert/re-cert  Difficulty in walking, not elsewhere classified - Plan: PT plan of care cert/re-cert      G-Codes - 33/83/29 1331    Functional Assessment Tool Used (Outpatient Only) foto 62% limitation   Functional Limitation Mobility: Walking and moving around   Mobility: Walking and Moving Around Current Status ([id number]) At least 60 percent but less than 80 percent impaired, limited or restricted   Mobility: Walking and Moving Around Goal Status [phone]) At least 20 percent but less than 40 percent impaired, limited or restricted       Problem List Patient Active Problem List   Diagnosis Date Noted  . OA (osteoarthritis) of knee 11/14/2016  . Greater trochanteric bursitis of right hip 10/19/2016  . Osteopenia 07/06/2012  . Acid reflux     Sumner Boast., PT 11/30/2016, 1:38 PM  [place], [place], [address] Phone: [phone]   Fax:  [phone]  Name: Pam [name] MRN: [id number] Date of Birth: [birth year]

## 2016-12-01 ENCOUNTER — Emergency Department (HOSPITAL_COMMUNITY): Payer: Medicare HMO

## 2016-12-01 ENCOUNTER — Encounter (HOSPITAL_COMMUNITY): Payer: Self-pay | Admitting: Emergency Medicine

## 2016-12-01 ENCOUNTER — Emergency Department (HOSPITAL_COMMUNITY)
Admission: EM | Admit: 2016-12-01 | Discharge: 2016-12-01 | Disposition: A | Discharge status: Home / Self Care | Payer: Medicare HMO | Attending: Emergency Medicine | Admitting: Emergency Medicine

## 2016-12-01 DIAGNOSIS — M545 Low back pain, unspecified: Secondary | ICD-10-CM

## 2016-12-01 DIAGNOSIS — N3000 Acute cystitis without hematuria: Secondary | ICD-10-CM | POA: Diagnosis not present

## 2016-12-01 DIAGNOSIS — Z96651 Presence of right artificial knee joint: Secondary | ICD-10-CM | POA: Diagnosis not present

## 2016-12-01 DIAGNOSIS — Z79899 Other long term (current) drug therapy: Secondary | ICD-10-CM | POA: Insufficient documentation

## 2016-12-01 DIAGNOSIS — M25551 Pain in right hip: Secondary | ICD-10-CM | POA: Diagnosis not present

## 2016-12-01 LAB — URINALYSIS, ROUTINE W REFLEX MICROSCOPIC
Bilirubin Urine: NEGATIVE
Glucose, UA: NEGATIVE mg/dL
Hgb urine dipstick: NEGATIVE
KETONES UR: NEGATIVE mg/dL
Nitrite: NEGATIVE
PROTEIN: NEGATIVE mg/dL
Specific Gravity, Urine: 1.016 (ref 1.005–1.030)
pH: 6 (ref 5.0–8.0)

## 2016-12-01 LAB — BASIC METABOLIC PANEL
Anion gap: 9 (ref 5–15)
BUN: 15 mg/dL (ref 6–20)
CHLORIDE: 105 mmol/L (ref 101–111)
CO2: 26 mmol/L (ref 22–32)
CREATININE: 0.65 mg/dL (ref 0.44–1.00)
Calcium: 9.4 mg/dL (ref 8.9–10.3)
GFR calc Af Amer: >60 mL/min (ref >60)
GFR calc non Af Amer: >60 mL/min (ref >60)
GLUCOSE: 118 mg/dL — AB (ref 65–99)
POTASSIUM: 3.9 mmol/L (ref 3.5–5.1)
Sodium: 140 mmol/L (ref 135–145)

## 2016-12-01 LAB — CBC WITH DIFFERENTIAL/PLATELET
Basophils Absolute: 0 10*3/uL (ref 0.0–0.1)
Basophils Relative: 0 %
Eosinophils Absolute: 0.2 10*3/uL (ref 0.0–0.7)
Eosinophils Relative: 6 %
HCT: 35.9 % — ABNORMAL LOW (ref 36.0–46.0)
Hemoglobin: 11.6 g/dL — ABNORMAL LOW (ref 12.0–15.0)
LYMPHS ABS: 1.8 10*3/uL (ref 0.7–4.0)
LYMPHS PCT: 51 %
MCH: 31.3 pg (ref 26.0–34.0)
MCHC: 32.3 g/dL (ref 30.0–36.0)
MCV: 96.8 fL (ref 78.0–100.0)
MONOS PCT: 9 %
Monocytes Absolute: 0.3 10*3/uL (ref 0.1–1.0)
Neutro Abs: 1.2 10*3/uL — ABNORMAL LOW (ref 1.7–7.7)
Neutrophils Relative %: 34 %
Platelets: 328 10*3/uL (ref 150–400)
RBC: 3.71 MIL/uL — AB (ref 3.87–5.11)
RDW: 13.8 % (ref 11.5–15.5)
WBC: 3.4 10*3/uL — AB (ref 4.0–10.5)

## 2016-12-01 MED ORDER — CEPHALEXIN 500 MG PO CAPS: 500.0000 mg | ORAL_CAPSULE | Freq: Three times a day (TID) | ORAL | 0 refills | Status: AC

## 2016-12-01 MED ORDER — CYCLOBENZAPRINE HCL 10 MG PO TABS: 10.0000 mg | ORAL_TABLET | Freq: Two times a day (BID) | ORAL | 0 refills | Status: DC | PRN

## 2016-12-01 MED ORDER — CYCLOBENZAPRINE HCL 10 MG PO TABS
10.0000 mg | ORAL_TABLET | Freq: Once | ORAL | Status: AC
  Administered 2016-12-01: 10 mg via ORAL
  Filled 2016-12-01: qty 1

## 2016-12-01 NOTE — ED Provider Notes (Signed)
[facility] DEPT Provider Note   CSN: [id number] Arrival date & time: 12/01/16  3903     History   Chief Complaint Chief Complaint  Patient presents with  . Back Pain    HPI Pam [name] is a 71 y.o. female.   The history is provided by the patient and medical records. No language interpreter was used.  Back Pain   This is a new problem. The current episode started more than 1 week ago. The problem occurs constantly. The problem has been gradually worsening. The pain is associated with no known injury. The pain is present in the lumbar spine. The quality of the pain is described as aching and stabbing. The pain does not radiate. The pain is at a severity of 7/10. The pain is moderate. Associated symptoms include dysuria and leg pain (mild). Pertinent negatives include no chest pain, no fever, no numbness, no headaches, no abdominal pain, no abdominal swelling, no bowel incontinence, no bladder incontinence, no pelvic pain, no paresthesias, no paresis, no tingling and no weakness. She has tried nothing for the symptoms. The treatment provided no relief.    Past Medical History:  Diagnosis Date  . Anxiety   . Arthritis   . Costochondritis   . GERD (gastroesophageal reflux disease)    Had surgery for  . Osteopenia 04/2016   T score -2.0 FRAX 6%/1.2%  . PONV (postoperative nausea and vomiting)     Patient Active Problem List   Diagnosis Date Noted  . OA (osteoarthritis) of knee 11/14/2016  . Greater trochanteric bursitis of right hip 10/19/2016  . Osteopenia 07/06/2012  . Acid reflux     Past Surgical History:  Procedure Laterality Date  . CHOLECYSTECTOMY    . ESOPHAGUS SURGERY  2008  . EXCISION/RELEASE BURSA HIP Right 10/19/2016   Procedure: Right hip bursectomy excision portion ileotibial band;  Surgeon: Latanya Maudlin, MD;  Location: [facility];  Service: Orthopedics;  Laterality: Right;  . KNEE SURGERY     Arthroscopic  right  . TOTAL KNEE ARTHROPLASTY Right  11/14/2016   Procedure: RIGHT TOTAL KNEE ARTHROPLASTY;  Surgeon: Gaynelle Arabian, MD;  Location: [facility];  Service: Orthopedics;  Laterality: Right;  . TUBAL LIGATION      OB History    Gravida Para Term Preterm AB Living   3 3 3     3    SAB TAB Ectopic Multiple Live Births                   Home Medications    Prior to Admission medications   Medication Sig Start Date End Date Taking? Authorizing Provider  acetaminophen (TYLENOL) 500 MG tablet Take 1,000 mg by mouth every 8 (eight) hours as needed for mild pain or headache.    [provider]  ALPRAZolam (XANAX) 0.25 MG tablet TAKE 1 TABLET BY MOUTH AT BEDTIME AS NEEDED Patient taking differently: TAKE 1 TABLET BY MOUTH AT BEDTIME AS NEEDED FOR SLEEP 09/08/16   Huel Cote, NP  atorvastatin (LIPITOR) 20 MG tablet Take 20 mg by mouth daily.    [provider]  Cetirizine HCl (ZYRTEC PO) Take 1 tablet by mouth daily as needed (allergies).     [provider]  FLUoxetine (PROZAC) 20 MG tablet Take 40 mg by mouth daily.     [provider]  HYDROmorphone (DILAUDID) 2 MG tablet Take 1-2 tablets (2-4 mg total) by mouth every 4 (four) hours as needed for severe pain. 11/15/16   Dara Lords,  Alexzandrew L, PA-C  Magnesium Oxide 250 MG TABS Take 250 mg by mouth every other day.     [provider]  methocarbamol (ROBAXIN) 500 MG tablet Take 1 tablet (500 mg total) by mouth every 6 (six) hours as needed for muscle spasms. 11/15/16   Perkins, Alexzandrew L, PA-C  methylPREDNISolone (MEDROL DOSEPAK) 4 MG TBPK tablet Take as directed 11/17/16   Dara Lords, Alexzandrew L, PA-C  rivaroxaban (XARELTO) 10 MG TABS tablet Take 1 tablet (10 mg total) by mouth daily with breakfast. Take Xarelto for two and a half more weeks following discharge from the hospital, then discontinue Xarelto. Once the patient has completed the blood thinner regimen, then take a Baby 81 mg Aspirin daily for three more weeks. 11/16/16   Perkins,  Alexzandrew L, PA-C  traMADol (ULTRAM) 50 MG tablet Take 1-2 tablets (50-100 mg total) by mouth every 6 (six) hours as needed for moderate pain. 11/15/16   Perkins, Alexzandrew L, PA-C    Family History Family History  Problem Relation Age of Onset  . Heart failure Brother   . Liver disease Brother   . Glaucoma Sister     Social History Social History  Substance Use Topics  . Smoking status: Never Smoker  . Smokeless tobacco: Never Used  . Alcohol use 1.8 oz/week    3 Standard drinks or equivalent per week     Comment: OCCASIONAL     Allergies   Codeine and Macrodantin   Review of Systems Review of Systems  Constitutional: Negative for chills and fever.  HENT: Negative for congestion and rhinorrhea.   Respiratory: Negative for cough, chest tightness, shortness of breath and wheezing.   Cardiovascular: Negative for chest pain.  Gastrointestinal: Positive for nausea. Negative for abdominal pain, bowel incontinence, constipation, diarrhea and vomiting.  Genitourinary: Positive for dysuria and frequency. Negative for bladder incontinence, decreased urine volume, flank pain and pelvic pain. Hematuria: possible.  Musculoskeletal: Positive for back pain. Negative for neck pain and neck stiffness.  Skin: Positive for wound (well healing on R knee and K thigh). Negative for rash.  Neurological: Negative for tingling, weakness, light-headedness, numbness, headaches and paresthesias.  Psychiatric/Behavioral: Negative for agitation.  All other systems reviewed and are negative.    Physical Exam Updated Vital Signs BP 115/69 (BP Location: Right Arm)   Pulse 87   Temp 98.4 F (36.9 C) (Oral)   Resp 18   SpO2 97%   Physical Exam  Constitutional: She is oriented to person, place, and time. She appears well-developed and well-nourished. No distress.  HENT:  Head: Normocephalic and atraumatic.  Nose: Nose normal.  Mouth/Throat: No oropharyngeal exudate.  Eyes: Pupils are equal,  round, and reactive to light.  Neck: Normal range of motion.  Cardiovascular: Normal rate and intact distal pulses.   No murmur heard. Pulmonary/Chest: Effort normal and breath sounds normal. No stridor. No respiratory distress. She has no wheezes. She exhibits no tenderness.  Abdominal: Soft. Bowel sounds are normal. She exhibits no mass. There is no tenderness.  Musculoskeletal: She exhibits tenderness. She exhibits no edema.       Right knee: No tenderness found.       Lumbar back: She exhibits tenderness, pain and spasm. She exhibits normal range of motion, no deformity and no laceration.       Back:       Right upper leg: She exhibits no tenderness.       Legs: Neurological: She is alert and oriented to person, place, and  time. No cranial nerve deficit or sensory deficit. She exhibits normal muscle tone.  Skin: Capillary refill takes less than 2 seconds. No rash noted. She is not diaphoretic. No erythema.  Psychiatric: She has a normal mood and affect.  Nursing note and vitals reviewed.    ED Treatments / Results  Labs (all labs ordered are listed, but only abnormal results are displayed) Labs Reviewed  CBC WITH DIFFERENTIAL/PLATELET - Abnormal; Notable for the following:       Result Value   WBC 3.4 (*)    RBC 3.71 (*)    Hemoglobin 11.6 (*)    HCT 35.9 (*)    Neutro Abs 1.2 (*)    All other components within normal limits  BASIC METABOLIC PANEL - Abnormal; Notable for the following:    Glucose, Bld 118 (*)    All other components within normal limits  URINALYSIS, ROUTINE W REFLEX MICROSCOPIC - Abnormal; Notable for the following:    APPearance HAZY (*)    Leukocytes, UA TRACE (*)    Bacteria, UA RARE (*)    Squamous Epithelial / LPF 0-5 (*)    All other components within normal limits  URINE CULTURE    EKG  EKG Interpretation None       Radiology Dg Lumbar Spine Complete  Result Date: 12/01/2016 CLINICAL DATA:  Mid to low back and right hip pain worsening  since right knee and hip surgery 3 weeks ago. No known trauma. EXAM: LUMBAR SPINE - COMPLETE 4+ VIEW COMPARISON:  Coronal and sagittal CT images through the lumbar spine from an abdominal and pelvic CT scan of March 03, 2010. FINDINGS: The lumbar vertebral bodies are preserved in height. The pedicles and transverse processes are intact. There is facet joint hypertrophy at L5-S1. There is no spondylolisthesis. There is moderate disc space narrowing at L2 through 3 and L5-S1 with mild narrowing at L4-5. The observed portions of the sacrum are normal. IMPRESSION: There is multilevel degenerative disc disease as described. There is no compression fracture or spondylolisthesis. Mild degenerative facet joint hypertrophy at L5-S1. Electronically Signed   By: David  Martinique M.D.   On: 12/01/2016 10:20   Dg Hip Unilat W Or Wo Pelvis 2-3 Views Right  Result Date: 12/01/2016 CLINICAL DATA:  Mid to low back and right hip pain worsening since right knee and hip surgery 3 weeks ago. No known trauma. EXAM: DG HIP (WITH OR WITHOUT PELVIS) 2-3V RIGHT COMPARISON:  Abdominal and pelvic CT scan with coronal and sagittal reconstructions through the pelvis and hips dated March 03, 2010. FINDINGS: The bony pelvis is subjectively adequately mineralized. There is no lytic nor blastic lesion. AP and lateral views of the right hip reveal preservation of the joint space. The articular surfaces of the femoral head and acetabulum remains smoothly rounded. The femoral neck, intertrochanteric, and subtrochanteric regions are normal. IMPRESSION: There is no acute or significant chronic bony abnormality of the right hip. Electronically Signed   By: David  Martinique M.D.   On: 12/01/2016 10:18    Procedures Procedures (including critical care time)  Medications Ordered in ED Medications  cyclobenzaprine (FLEXERIL) tablet 10 mg (10 mg Oral Given 12/01/16 1001)     Initial Impression / Assessment and Plan / ED Course  I have reviewed  the triage vital signs and the nursing notes.  Pertinent labs & imaging results that were available during my care of the patient were reviewed by me and considered in my medical decision making (see chart for  details).     Pam [name] is a 71 y.o. female with a past medical history significant forrecent right knee surgery, recent right hip surgery,, chronic arthritis, and GERD who presents with low back pain, dysuria, urinary frequency, and malaise. Patient reports she is concerned she has a urinary tract infection or kidney infection. She reports that she has had a remote history of these infections. She says that she had knee surgery several weeks ago and her right knee and right hip are feeling much better. She denies any leg pain but says that with her new urinary symptoms and back pain she is concerned about infection. She denies fevers or chills. She denies vomiting but does report some mild nausea. She reports her low back pain is all across her low back and is a 7 out of 10 severity. She denies frank hematuria but says her may have been some flecks of red. She denies any constipation or diarrhea. She denies abdominal pain. No respiratory or chest symptoms such as coughing congestion or chest pain. She has been taking her home medications for the symptoms without relief.  On exam, patient has tenderness across the low back. No CVA tenderness. Patient has no tenderness in the right knee or right thigh. Minimal tenderness of the right hip with palpation. No left-sided tenderness. Normal pulses in lower extremities. No abdominal tenderness. Lungs clear.  Patient will have x-ray of the lumbar spine and right hip given her recent surgery and continued pain. She will have laboratory testing to look for kidney dysfunction in the setting of the low back pain and the urinary symptoms. Patient will have urinalysis to look for UTI. If there is hematuria, will consider further imaging to look for kidney  stones.   Patient was given pain medicineduring initial workup. Anticipate reassessment after imaging labs and workup.       Urinalysis revealed leukocytes and bacteria. In setting of her symptoms, patient will be treated for UTI. Laboratory testing otherwise reassuring. Diagnostic imaging showed no significant abnormalities or changes.  Patient agreed with plan of discharge with antibiotics and a muscle relaxant for her muscle spasms palpated on her back. Patient will follow-up with her PCP and her back doctor. Patient understands return precautions for pyelonephritis and worsening symptoms. Patient had no other questions or concerns and was discharged in good condition.   Final Clinical Impressions(s) / ED Diagnoses   Final diagnoses:  Acute cystitis without hematuria  Acute bilateral low back pain without sciatica    New Prescriptions Discharge Medication List as of 12/01/2016 12:09 PM    START taking these medications   Details  cephALEXin (KEFLEX) 500 MG capsule Take 1 capsule (500 mg total) by mouth 3 (three) times daily., Starting Thu 12/01/2016, Until Thu 12/15/2016, Print    cyclobenzaprine (FLEXERIL) 10 MG tablet Take 1 tablet (10 mg total) by mouth 2 (two) times daily as needed for muscle spasms., Starting Thu 12/01/2016, Print        Clinical Impression: 1. Acute cystitis without hematuria   2. Acute bilateral low back pain without sciatica     Disposition: Discharge  Condition: Good  I have discussed the results, Dx and Tx plan with the pt(& family if present). He/she/they expressed understanding and agree(s) with the plan. Discharge instructions discussed at great length. Strict return precautions discussed and pt &/or family have verbalized understanding of the instructions. No further questions at time of discharge.    Discharge Medication List as of 12/01/2016 12:09 PM  START taking these medications   Details  cephALEXin (KEFLEX) 500 MG capsule Take 1  capsule (500 mg total) by mouth 3 (three) times daily., Starting Thu 12/01/2016, Until Thu 12/15/2016, Print    cyclobenzaprine (FLEXERIL) 10 MG tablet Take 1 tablet (10 mg total) by mouth 2 (two) times daily as needed for muscle spasms., Starting Thu 12/01/2016, Print        Follow Up: [facility] [address], [place] [address] [phone]  Schedule an appointment as soon as possible for a visit    [facility] [place] [id number] [place] [phone]  If symptoms worsen     Tegeler, Gwenyth Allegra, MD 12/01/16 1356

## 2016-12-01 NOTE — Discharge Instructions (Signed)
Please take the antibiotics to treat your urinary tract infection. Please use the new muscle relaxant to help with your symptoms. Please do not take the previous muscle relaxant. Please use her home pain medications and be careful not to fall. Please follow-up with your primary care physician and/or orthopedics team. If any symptoms change or worsen, please return to the nearest emergency department.

## 2016-12-01 NOTE — ED Triage Notes (Signed)
Pt complaint of lower back pain worsening since right knee/hip surgery 3 weeks ago; denies GU symptoms.

## 2016-12-02 LAB — URINE CULTURE: Culture: NO GROWTH

## 2016-12-05 ENCOUNTER — Ambulatory Visit: Payer: Medicare HMO | Admitting: Physical Therapy

## 2016-12-05 ENCOUNTER — Encounter: Payer: Self-pay | Admitting: Physical Therapy

## 2016-12-05 DIAGNOSIS — M25561 Pain in right knee: Secondary | ICD-10-CM

## 2016-12-05 DIAGNOSIS — R2241 Localized swelling, mass and lump, right lower limb: Secondary | ICD-10-CM

## 2016-12-05 DIAGNOSIS — R262 Difficulty in walking, not elsewhere classified: Secondary | ICD-10-CM

## 2016-12-05 DIAGNOSIS — M25661 Stiffness of right knee, not elsewhere classified: Secondary | ICD-10-CM

## 2016-12-05 NOTE — Therapy (Signed)
[facility] [place], [place], [address] Phone: [phone]   Fax:  [phone]  Physical Therapy Treatment  Patient Details  Name: Pam [name] MRN: [id number] Date of Birth: 04-Sep-1945 Referring Provider: Wynelle Link  Encounter Date: 12/05/2016      PT End of Session - 12/05/16 1426    Visit Number 2   Date for PT Re-Evaluation 01/30/17   PT Start Time 1345   PT Stop Time 1442   PT Time Calculation (min) 57 min   Activity Tolerance Patient tolerated treatment well   Behavior During Therapy [facility] for tasks assessed/performed      Past Medical History:  Diagnosis Date  . Anxiety   . Arthritis   . Costochondritis   . GERD (gastroesophageal reflux disease)    Had surgery for  . Osteopenia 04/2016   T score -2.0 FRAX 6%/1.2%  . PONV (postoperative nausea and vomiting)     Past Surgical History:  Procedure Laterality Date  . CHOLECYSTECTOMY    . ESOPHAGUS SURGERY  2008  . EXCISION/RELEASE BURSA HIP Right 10/19/2016   Procedure: Right hip bursectomy excision portion ileotibial band;  Surgeon: Latanya Maudlin, MD;  Location: [facility];  Service: Orthopedics;  Laterality: Right;  . KNEE SURGERY     Arthroscopic  right  . TOTAL KNEE ARTHROPLASTY Right 11/14/2016   Procedure: RIGHT TOTAL KNEE ARTHROPLASTY;  Surgeon: Gaynelle Arabian, MD;  Location: [facility];  Service: Orthopedics;  Laterality: Right;  . TUBAL LIGATION      There were no vitals filed for this visit.      Subjective Assessment - 12/05/16 1344    Subjective "It been fine, I have been doing what he told me"   Currently in Pain? Yes   Pain Score 6    Pain Location Knee   Pain Orientation Right                         [facility] Adult PT Treatment/Exercise - 12/05/16 0001      Knee/Hip Exercises: Aerobic   Nustep L4 x6 min      Knee/Hip Exercises: Machines for Strengthening   Cybex Leg Press 20lb 2x10, RLE no weight 2x5 for  stretch     Knee/Hip Exercises: Seated   Long Arc Quad Right;2 sets;10 reps   Hamstring Curl Right;2 sets;10 reps   Hamstring Limitations red tband    Sit to Sand 2 sets;10 reps;without UE support     Knee/Hip Exercises: Supine   Quad Sets Right;2 sets;10 reps     Vasopneumatic   Number Minutes Vasopneumatic  15 minutes   Vasopnuematic Location  Knee   Vasopneumatic Pressure Medium   Vasopneumatic Temperature  36     Manual Therapy   Manual Therapy Passive ROM;Joint mobilization   Joint Mobilization patella mobs greades 2-3   Passive ROM R knee flexion and ext                  PT Short Term Goals - 11/30/16 1329      PT SHORT TERM GOAL #1   Title independent with initial ?HEP   Time 2   Period Weeks   Status New           PT Long Term Goals - 11/30/16 1330      PT LONG TERM GOAL #1   Title decrease pain 50%   Time 8   Period Weeks  Status New     PT LONG TERM GOAL #2   Title walk without device community distances   Time 8   Period Weeks   Status New     PT LONG TERM GOAL #3   Title increase AROM of the right knee to 5-115 degrees flexion   Time 8   Period Weeks   Status New     PT LONG TERM GOAL #4   Title go up and down stairs step over step   Time 8   Period Weeks   Status New               Plan - 12/05/16 1427    Clinical Impression Statement Pt tolerated an initial progression to exercises well. Pt has good passive ROM with a soft end point Pt reports pain at the end range of [place] quad contraction with LAQ but flack full TKE. Cues for pacing, pt does not like to rest between sets.   Rehab Potential Good   PT Frequency 3x / week   PT Duration 8 weeks   PT Treatment/Interventions ADLs/Self Care Home Management;Cryotherapy;Electrical Stimulation;Moist Heat;Therapeutic activities;Therapeutic exercise;Balance training;Neuromuscular re-education;Gait training;Stair training;Functional mobility training;Scar mobilization;Manual  techniques;Vasopneumatic Device   PT Next Visit Plan start gym exercises and progress ROM as tolerated      Patient will benefit from skilled therapeutic intervention in order to improve the following deficits and impairments:  Abnormal gait, Decreased activity tolerance, Decreased mobility, Decreased strength, Increased edema, Decreased scar mobility, Pain, Difficulty walking, Decreased range of motion  Visit Diagnosis: Acute pain of right knee  Stiffness of right knee, not elsewhere classified  Difficulty in walking, not elsewhere classified  Localized swelling, mass and lump, right lower limb     Problem List Patient Active Problem List   Diagnosis Date Noted  . OA (osteoarthritis) of knee 11/14/2016  . Greater trochanteric bursitis of right hip 10/19/2016  . Osteopenia 07/06/2012  . Acid reflux     Scot Jun, PTA 12/05/2016, 2:29 PM  [facility] [place], [place], [address] Phone: [phone]   Fax:  [phone]  Name: Pam [name] MRN: [id number] Date of Birth: 12/05/1945

## 2016-12-07 ENCOUNTER — Encounter: Payer: Self-pay | Admitting: Physical Therapy

## 2016-12-07 ENCOUNTER — Ambulatory Visit: Payer: Medicare HMO | Admitting: Physical Therapy

## 2016-12-07 DIAGNOSIS — M25661 Stiffness of right knee, not elsewhere classified: Secondary | ICD-10-CM | POA: Diagnosis not present

## 2016-12-07 DIAGNOSIS — M25561 Pain in right knee: Secondary | ICD-10-CM | POA: Diagnosis not present

## 2016-12-07 DIAGNOSIS — R2241 Localized swelling, mass and lump, right lower limb: Secondary | ICD-10-CM | POA: Diagnosis not present

## 2016-12-07 DIAGNOSIS — R262 Difficulty in walking, not elsewhere classified: Secondary | ICD-10-CM

## 2016-12-07 NOTE — Therapy (Signed)
[place], [place], [address] Phone: [phone]   Fax:  [phone]  Physical Therapy Treatment  Patient Details  Name: Pam [name] MRN: [id number] Date of Birth: 1945-03-03 Referring Provider: Wynelle Link  Encounter Date: 12/07/2016      PT End of Session - 12/07/16 1342    Visit Number 3   Date for PT Re-Evaluation 01/30/17   PT Start Time 4709   PT Stop Time 1357   PT Time Calculation (min) 59 min   Activity Tolerance Patient tolerated treatment well   Behavior During Therapy [facility] for tasks assessed/performed      Past Medical History:  Diagnosis Date  . Anxiety   . Arthritis   . Costochondritis   . GERD (gastroesophageal reflux disease)    Had surgery for  . Osteopenia 04/2016   T score -2.0 FRAX 6%/1.2%  . PONV (postoperative nausea and vomiting)     Past Surgical History:  Procedure Laterality Date  . CHOLECYSTECTOMY    . ESOPHAGUS SURGERY  2008  . EXCISION/RELEASE BURSA HIP Right 10/19/2016   Procedure: Right hip bursectomy excision portion ileotibial band;  Surgeon: Latanya Maudlin, MD;  Location: [facility];  Service: Orthopedics;  Laterality: Right;  . KNEE SURGERY     Arthroscopic  right  . TOTAL KNEE ARTHROPLASTY Right 11/14/2016   Procedure: RIGHT TOTAL KNEE ARTHROPLASTY;  Surgeon: Gaynelle Arabian, MD;  Location: [facility];  Service: Orthopedics;  Laterality: Right;  . TUBAL LIGATION      There were no vitals filed for this visit.      Subjective Assessment - 12/07/16 1258    Subjective "I forgot my cane" Pt stated that's she has some pain when she moves around.   Currently in Pain? Yes   Pain Score 6    Pain Location Knee   Pain Orientation Right                         [facility] Adult PT Treatment/Exercise - 12/07/16 0001      Knee/Hip Exercises: Aerobic   Recumbent Bike partial revolutions x3 min    Nustep L4 x6 min      Knee/Hip Exercises:  Machines for Strengthening   Cybex Leg Press 20lb 2x10, RLETKE 20lb 3x5 RLE no weight x5 for stretch     Knee/Hip Exercises: Standing   Heel Raises Both;2 sets;15 reps;2 seconds   Walking with Sports Cord 30lb xx front and back, pt not comfortable with side steps      Knee/Hip Exercises: Seated   Long Arc Quad Right;2 sets;15 reps   Long Arc Quad Weight 3 lbs.   Hamstring Curl Right;2 sets;15 reps   Hamstring Limitations red tband      Vasopneumatic   Number Minutes Vasopneumatic  15 minutes   Vasopnuematic Location  Knee   Vasopneumatic Pressure Medium   Vasopneumatic Temperature  36     Manual Therapy   Manual Therapy Passive ROM;Joint mobilization   Manual therapy comments Some PROM taken to end range and held   Joint Mobilization patella mobs greades 2-3   Passive ROM R knee flexion and ext                  PT Short Term Goals - 11/30/16 1329      PT SHORT TERM GOAL #1   Title independent with initial ?HEP   Time 2   Period Weeks  Status New           PT Long Term Goals - 11/30/16 1330      PT LONG TERM GOAL #1   Title decrease pain 50%   Time 8   Period Weeks   Status New     PT LONG TERM GOAL #2   Title walk without device community distances   Time 8   Period Weeks   Status New     PT LONG TERM GOAL #3   Title increase AROM of the right knee to 5-115 degrees flexion   Time 8   Period Weeks   Status New     PT LONG TERM GOAL #4   Title go up and down stairs step over step   Time 8   Period Weeks   Status New               Plan - 12/07/16 1342    Clinical Impression Statement Pt with good PROM & AROM. Pt able to make full revolutions on bike towards the end of the 3 minutes. Does reports some pain with PROM and patella mobes. Pt does well with strengthening interventions. Pt uncomfortable to attempt resisted side step/   Rehab Potential Good   PT Frequency 3x / week   PT Duration 8 weeks   PT Next Visit Plan start gym  exercises and progress ROM as tolerated      Patient will benefit from skilled therapeutic intervention in order to improve the following deficits and impairments:  Abnormal gait, Decreased activity tolerance, Decreased mobility, Decreased strength, Increased edema, Decreased scar mobility, Pain, Difficulty walking, Decreased range of motion  Visit Diagnosis: Acute pain of right knee  Stiffness of right knee, not elsewhere classified  Difficulty in walking, not elsewhere classified  Localized swelling, mass and lump, right lower limb     Problem List Patient Active Problem List   Diagnosis Date Noted  . OA (osteoarthritis) of knee 11/14/2016  . Greater trochanteric bursitis of right hip 10/19/2016  . Osteopenia 07/06/2012  . Acid reflux     Scot Jun, PTA 12/07/2016, 1:45 PM  [place], [place], [address] Phone: [phone]   Fax:  [phone]  Name: [name] MRN: [id number] Date of Birth: 1946-02-21

## 2016-12-13 ENCOUNTER — Encounter: Payer: Self-pay | Admitting: Physical Therapy

## 2016-12-13 ENCOUNTER — Ambulatory Visit: Payer: Medicare HMO | Admitting: Physical Therapy

## 2016-12-13 DIAGNOSIS — R262 Difficulty in walking, not elsewhere classified: Secondary | ICD-10-CM | POA: Diagnosis not present

## 2016-12-13 DIAGNOSIS — M25561 Pain in right knee: Secondary | ICD-10-CM

## 2016-12-13 DIAGNOSIS — M25661 Stiffness of right knee, not elsewhere classified: Secondary | ICD-10-CM

## 2016-12-13 DIAGNOSIS — R2241 Localized swelling, mass and lump, right lower limb: Secondary | ICD-10-CM

## 2016-12-13 NOTE — Therapy (Signed)
[place], [place], [address] Phone: [phone]   Fax:  [phone]  Physical Therapy Treatment  Patient Details  Name: Pam [name] MRN: [id number] Date of Birth: [birth year] Referring Provider: Wynelle Link  Encounter Date: 12/13/2016      PT End of Session - 12/13/16 1342    Visit Number 4   Date for PT Re-Evaluation 01/30/17   PT Start Time 1300   PT Stop Time 1356   PT Time Calculation (min) 56 min   Activity Tolerance Patient tolerated treatment well   Behavior During Therapy [facility] for tasks assessed/performed      Past Medical History:  Diagnosis Date  . Anxiety   . Arthritis   . Costochondritis   . GERD (gastroesophageal reflux disease)    Had surgery for  . Osteopenia 04/2016   T score -2.0 FRAX 6%/1.2%  . PONV (postoperative nausea and vomiting)     Past Surgical History:  Procedure Laterality Date  . CHOLECYSTECTOMY    . ESOPHAGUS SURGERY  2008  . EXCISION/RELEASE BURSA HIP Right 10/19/2016   Procedure: Right hip bursectomy excision portion ileotibial band;  Surgeon: Latanya Maudlin, MD;  Location: [facility];  Service: Orthopedics;  Laterality: Right;  . KNEE SURGERY     Arthroscopic  right  . TOTAL KNEE ARTHROPLASTY Right 11/14/2016   Procedure: RIGHT TOTAL KNEE ARTHROPLASTY;  Surgeon: Gaynelle Arabian, MD;  Location: [facility];  Service: Orthopedics;  Laterality: Right;  . TUBAL LIGATION      There were no vitals filed for this visit.      Subjective Assessment - 12/13/16 1300    Subjective "Im not walking with that cane anymore"   Currently in Pain? No/denies   Pain Score 5    Pain Location Knee   Pain Orientation Right            [facility] PT Assessment - 12/13/16 0001      AROM   AROM Assessment Site Knee   Right/Left Knee Right   Right Knee Extension 5   Right Knee Flexion 104                     [facility] Adult PT Treatment/Exercise - 12/13/16 0001       Knee/Hip Exercises: Aerobic   Recumbent Bike Full revolutions seat 4 x3 min    Nustep L4 x6 min      Knee/Hip Exercises: Machines for Strengthening   Cybex Knee Extension 5lb 2x10, RLE 5lb 2x5   Cybex Knee Flexion 20lb x10, x15, RLE 105b x10    Cybex Leg Press 20lb 3x10, RLE TKE 20lb 3x5 RLE      Knee/Hip Exercises: Standing   Forward Step Up Right;2 sets;10 reps;Hand Hold: 0;Step Height: 4";Step Height: 6"     Knee/Hip Exercises: Seated   Sit to Sand 2 sets;10 reps;without UE support     Vasopneumatic   Number Minutes Vasopneumatic  15 minutes   Vasopnuematic Location  Knee   Vasopneumatic Pressure Medium   Vasopneumatic Temperature  36                  PT Short Term Goals - 11/30/16 1329      PT SHORT TERM GOAL #1   Title independent with initial ?HEP   Time 2   Period Weeks   Status New           PT Long Term Goals - 11/30/16 1330  PT LONG TERM GOAL #1   Title decrease pain 50%   Time 8   Period Weeks   Status New     PT LONG TERM GOAL #2   Title walk without device community distances   Time 8   Period Weeks   Status New     PT LONG TERM GOAL #3   Title increase AROM of the right knee to 5-115 degrees flexion   Time 8   Period Weeks   Status New     PT LONG TERM GOAL #4   Title go up and down stairs step over step   Time 8   Period Weeks   Status New               Plan - 12/13/16 1343    Clinical Impression Statement Pt has progressed increasing her L knee AROM. Completed all exercises well, pt able to progress and to SL strengthening with RLE. AROM taken after some PROM.   Rehab Potential Good   PT Frequency 3x / week   PT Duration 8 weeks   PT Treatment/Interventions ADLs/Self Care Home Management;Cryotherapy;Electrical Stimulation;Moist Heat;Therapeutic activities;Therapeutic exercise;Balance training;Neuromuscular re-education;Gait training;Stair training;Functional mobility training;Scar mobilization;Manual  techniques;Vasopneumatic Device   PT Next Visit Plan start gym exercises and progress ROM as tolerated      Patient will benefit from skilled therapeutic intervention in order to improve the following deficits and impairments:  Abnormal gait, Decreased activity tolerance, Decreased mobility, Decreased strength, Increased edema, Decreased scar mobility, Pain, Difficulty walking, Decreased range of motion  Visit Diagnosis: Acute pain of right knee  Stiffness of right knee, not elsewhere classified  Difficulty in walking, not elsewhere classified  Localized swelling, mass and lump, right lower limb     Problem List Patient Active Problem List   Diagnosis Date Noted  . OA (osteoarthritis) of knee 11/14/2016  . Greater trochanteric bursitis of right hip 10/19/2016  . Osteopenia 07/06/2012  . Acid reflux     Scot Jun, PTA 12/13/2016, 1:52 PM  [place], [place], [address] Phone: [phone]   Fax:  [phone]  Name: Pam [name] MRN: [id number] Date of Birth: 1945/11/09

## 2016-12-14 ENCOUNTER — Ambulatory Visit: Payer: Medicare HMO | Admitting: Physical Therapy

## 2016-12-16 ENCOUNTER — Encounter: Payer: Self-pay | Admitting: Physical Therapy

## 2016-12-16 ENCOUNTER — Ambulatory Visit: Payer: Medicare HMO | Admitting: Physical Therapy

## 2016-12-16 DIAGNOSIS — R262 Difficulty in walking, not elsewhere classified: Secondary | ICD-10-CM | POA: Diagnosis not present

## 2016-12-16 DIAGNOSIS — M25661 Stiffness of right knee, not elsewhere classified: Secondary | ICD-10-CM

## 2016-12-16 DIAGNOSIS — M25561 Pain in right knee: Secondary | ICD-10-CM | POA: Diagnosis not present

## 2016-12-16 DIAGNOSIS — R2241 Localized swelling, mass and lump, right lower limb: Secondary | ICD-10-CM

## 2016-12-16 NOTE — Therapy (Signed)
[place], [place], [address] Phone: [phone]   Fax:  [phone]  Physical Therapy Treatment  Patient Details  Name: AUBREA MEIXNER MRN: [id number] Date of Birth: 11-25-1945 Referring Provider: Wynelle Link  Encounter Date: 12/16/2016      PT End of Session - 12/16/16 0939    Visit Number 5   Date for PT Re-Evaluation 01/30/17   PT Start Time 0920   PT Stop Time 1015   PT Time Calculation (min) 55 min      Past Medical History:  Diagnosis Date  . Anxiety   . Arthritis   . Costochondritis   . GERD (gastroesophageal reflux disease)    Had surgery for  . Osteopenia 04/2016   T score -2.0 FRAX 6%/1.2%  . PONV (postoperative nausea and vomiting)     Past Surgical History:  Procedure Laterality Date  . CHOLECYSTECTOMY    . ESOPHAGUS SURGERY  2008  . EXCISION/RELEASE BURSA HIP Right 10/19/2016   Procedure: Right hip bursectomy excision portion ileotibial band;  Surgeon: Latanya Maudlin, MD;  [facility];  Service: Orthopedics;  Laterality: Right;  . KNEE SURGERY     Arthroscopic  right  . TOTAL KNEE ARTHROPLASTY Right 11/14/2016   Procedure: RIGHT TOTAL KNEE ARTHROPLASTY;  Surgeon: Gaynelle Arabian, MD;  [facility];  Service: Orthopedics;  Laterality: Right;  . TUBAL LIGATION      There were no vitals filed for this visit.      Subjective Assessment - 12/16/16 0922    Subjective doing very well   Currently in Pain? Yes   Pain Score 2    Pain Location Knee   Pain Orientation Right                         [facility] Adult PT Treatment/Exercise - 12/16/16 0001      Knee/Hip Exercises: Aerobic   Elliptical I 12 R 4 3 min   Nustep L4 x6 min   LE only     Knee/Hip Exercises: Machines for Strengthening   Cybex Knee Extension 5lb 2x10, RLE 5lb 2x5   Cybex Knee Flexion 20# 2 sets 10, RT only 15# 10 times   Cybex Leg Press 30# 2 sets 15, RT LE unlocked 15 for  ROM  30# 2 sets 15 calf raises     Knee/Hip Exercises: Standing   Terminal Knee Extension Limitations 2 sets 15 green tband   Lateral Step Up Right;1 set;10 reps;Hand Hold: 2;Step Height: 6"   Forward Step Up Right;2 sets;10 reps;Hand Hold: 2;Step Height: 6"   Other Standing Knee Exercises RT hip 3 way red tband 15 times     Knee/Hip Exercises: Seated   Long Arc Quad Strengthening;Right;2 sets;10 reps  3 sec TKE hold     Vasopneumatic   Number Minutes Vasopneumatic  15 minutes   Vasopnuematic Location  Knee   Vasopneumatic Pressure Medium   Vasopneumatic Temperature  36     Manual Therapy   Manual Therapy Passive ROM   Passive ROM RT knee flex                  PT Short Term Goals - 12/16/16 0926      PT SHORT TERM GOAL #1   Title independent with initial ?HEP   Status Achieved           PT Long Term Goals - 12/16/16 7356  PT LONG TERM GOAL #1   Title decrease pain 50%   Status On-going     PT LONG TERM GOAL #2   Title walk without device community distances   Status Partially Met     PT LONG TERM GOAL #3   Title increase AROM of the right knee to 5-115 degrees flexion   Status Partially Met     PT LONG TERM GOAL #4   Title go up and down stairs step over step   Status On-going               Plan - 12/16/16 0939    Clinical Impression Statement making excellent progress with all goals. c/o RT hip pain with some ex d/t s/p bursitis surgery prior to knee, modified some ex d/t this   PT Treatment/Interventions ADLs/Self Care Home Management;Cryotherapy;Electrical Stimulation;Moist Heat;Therapeutic activities;Therapeutic exercise;Balance training;Neuromuscular re-education;Gait training;Stair training;Functional mobility training;Scar mobilization;Manual techniques;Vasopneumatic Device   PT Next Visit Plan write MD note 10/30 at 3:30. Possible D/C as pt is active at [facility]      Patient will benefit from skilled therapeutic intervention in  order to improve the following deficits and impairments:  Abnormal gait, Decreased activity tolerance, Decreased mobility, Decreased strength, Increased edema, Decreased scar mobility, Pain, Difficulty walking, Decreased range of motion  Visit Diagnosis: Acute pain of right knee  Stiffness of right knee, not elsewhere classified  Difficulty in walking, not elsewhere classified  Localized swelling, mass and lump, right lower limb     Problem List Patient Active Problem List   Diagnosis Date Noted  . OA (osteoarthritis) of knee 11/14/2016  . Greater trochanteric bursitis of right hip 10/19/2016  . Osteopenia 07/06/2012  . Acid reflux     PAYSEUR,ANGIE  PTA 12/16/2016, 10:01 AM  [place], [place], [address] Phone: [phone]   Fax:  [phone]  Name: FERRELL FLAM MRN: [id number] Date of Birth: August 16, 1945

## 2016-12-20 ENCOUNTER — Ambulatory Visit: Payer: Medicare HMO | Admitting: Physical Therapy

## 2016-12-20 ENCOUNTER — Encounter: Payer: Self-pay | Admitting: Physical Therapy

## 2016-12-20 DIAGNOSIS — M25661 Stiffness of right knee, not elsewhere classified: Secondary | ICD-10-CM

## 2016-12-20 DIAGNOSIS — R2241 Localized swelling, mass and lump, right lower limb: Secondary | ICD-10-CM

## 2016-12-20 DIAGNOSIS — R262 Difficulty in walking, not elsewhere classified: Secondary | ICD-10-CM

## 2016-12-20 DIAGNOSIS — Z96651 Presence of right artificial knee joint: Secondary | ICD-10-CM | POA: Diagnosis not present

## 2016-12-20 DIAGNOSIS — M25561 Pain in right knee: Secondary | ICD-10-CM

## 2016-12-20 DIAGNOSIS — Z471 Aftercare following joint replacement surgery: Secondary | ICD-10-CM | POA: Diagnosis not present

## 2016-12-20 NOTE — Therapy (Signed)
[place], [place], [address] Phone: [phone]   Fax:  [phone]  Physical Therapy Treatment  Patient Details  Name: Pam [name] MRN: [id number] Date of Birth: 1945/11/12 Referring Provider: Wynelle Link  Encounter Date: 12/20/2016      PT End of Session - 12/20/16 1128    Visit Number 6   Date for PT Re-Evaluation 01/30/17   PT Start Time 5686   PT Stop Time 1143   PT Time Calculation (min) 59 min   Activity Tolerance Patient tolerated treatment well   Behavior During Therapy [facility] for tasks assessed/performed      Past Medical History:  Diagnosis Date  . Anxiety   . Arthritis   . Costochondritis   . GERD (gastroesophageal reflux disease)    Had surgery for  . Osteopenia 04/2016   T score -2.0 FRAX 6%/1.2%  . PONV (postoperative nausea and vomiting)     Past Surgical History:  Procedure Laterality Date  . CHOLECYSTECTOMY    . ESOPHAGUS SURGERY  2008  . EXCISION/RELEASE BURSA HIP Right 10/19/2016   Procedure: Right hip bursectomy excision portion ileotibial band;  Surgeon: Latanya Maudlin, MD;  [facility];  Service: Orthopedics;  Laterality: Right;  . KNEE SURGERY     Arthroscopic  right  . TOTAL KNEE ARTHROPLASTY Right 11/14/2016   Procedure: RIGHT TOTAL KNEE ARTHROPLASTY;  Surgeon: Gaynelle Arabian, MD;  Location: [facility];  Service: Orthopedics;  Laterality: Right;  . TUBAL LIGATION      There were no vitals filed for this visit.      Subjective Assessment - 12/20/16 1045    Subjective Pt reports no new issues, drove for the first time the other day. Sees MD this afternoon   Currently in Pain? No/denies   Pain Score 0-No pain            [facility] PT Assessment - 12/20/16 0001      PROM   PROM Assessment Site Knee   Right/Left Knee Right   Right Knee Extension 8   Right Knee Flexion 95                     [facility] Adult PT Treatment/Exercise -  12/20/16 0001      Ambulation/Gait   Stairs Yes   Stairs Assistance 6: Modified independent (Device/Increase time)   Stair Management Technique One rail Right;Alternating pattern   Number of Stairs 24   Height of Stairs 6   Gait Comments 2 flights, Pain when controlling descents with RLE, Some hip compensation with RLE to clear steps with both going up and down     Knee/Hip Exercises: Aerobic   Elliptical I 12 R 4  min   Nustep L4 x6 min LE only      Knee/Hip Exercises: Machines for Strengthening   Cybex Knee Extension 5lb 2x10, RLE 5lb 2x5   Cybex Knee Flexion 25# 2 sets 15, RT only 15# 10 times   Cybex Leg Press 40lb 2x15, RLE 20lb x10 x5      Knee/Hip Exercises: Standing   Heel Raises Both;2 sets;15 reps;2 seconds   Knee Flexion Right;2 sets;15 reps   Knee Flexion Limitations 2   Lateral Step Up Right;10 reps;Step Height: 6";2 sets;Hand Hold: 0   Forward Step Up Right;2 sets;10 reps;Step Height: 6";Hand Hold: 0  explosive      Vasopneumatic   Number Minutes Vasopneumatic  15 minutes   Vasopnuematic Location  Knee   Vasopneumatic Pressure Medium   Vasopneumatic Temperature  36                  PT Short Term Goals - 12/20/16 1128      PT SHORT TERM GOAL #1   Title independent with initial ?HEP   Status Achieved           PT Long Term Goals - 12/20/16 1128      PT LONG TERM GOAL #1   Title decrease pain 50%   Status On-going     PT LONG TERM GOAL #2   Title walk without device community distances   Status Partially Met     PT LONG TERM GOAL #3   Title increase AROM of the right knee to 5-115 degrees flexion   Status Partially Met     PT LONG TERM GOAL #4   Title go up and down stairs step over step   Status On-going               Plan - 12/20/16 1129    Clinical Impression Statement Pt continues to do well in therapy, but lacks ROM in the R knee. Cueing given for pacing, pt does not like to rest between sets. Pain reported during  stair negotiation when controlling descents with RLE. R quad fatigues quick with SL extensions.    Rehab Potential Good   PT Frequency 3x / week   PT Duration 8 weeks   PT Treatment/Interventions ADLs/Self Care Home Management;Cryotherapy;Electrical Stimulation;Moist Heat;Therapeutic activities;Therapeutic exercise;Balance training;Neuromuscular re-education;Gait training;Stair training;Functional mobility training;Scar mobilization;Manual techniques;Vasopneumatic Device   PT Next Visit Plan  Possible D/C as pt is active at [facility]      Patient will benefit from skilled therapeutic intervention in order to improve the following deficits and impairments:  Abnormal gait, Decreased activity tolerance, Decreased mobility, Decreased strength, Increased edema, Decreased scar mobility, Pain, Difficulty walking, Decreased range of motion  Visit Diagnosis: Stiffness of right knee, not elsewhere classified  Acute pain of right knee  Difficulty in walking, not elsewhere classified  Localized swelling, mass and lump, right lower limb     Problem List Patient Active Problem List   Diagnosis Date Noted  . OA (osteoarthritis) of knee 11/14/2016  . Greater trochanteric bursitis of right hip 10/19/2016  . Osteopenia 07/06/2012  . Acid reflux     Scot Jun, PTA 12/20/2016, 11:34 AM  [place], [place], [address] Phone: [phone]   Fax:  [phone]  Name: Pam [name] MRN: [id number] Date of Birth: 1946/01/26

## 2016-12-29 ENCOUNTER — Other Ambulatory Visit: Payer: Self-pay | Admitting: Women's Health

## 2016-12-30 DIAGNOSIS — M545 Low back pain: Secondary | ICD-10-CM | POA: Diagnosis not present

## 2016-12-30 DIAGNOSIS — M533 Sacrococcygeal disorders, not elsewhere classified: Secondary | ICD-10-CM | POA: Diagnosis not present

## 2016-12-30 NOTE — Telephone Encounter (Signed)
There is a refill request for Xanax.  I do not see where it is documented why she is using this.

## 2017-01-02 DIAGNOSIS — H5213 Myopia, bilateral: Secondary | ICD-10-CM | POA: Diagnosis not present

## 2017-01-02 DIAGNOSIS — H43392 Other vitreous opacities, left eye: Secondary | ICD-10-CM | POA: Diagnosis not present

## 2017-01-02 DIAGNOSIS — H524 Presbyopia: Secondary | ICD-10-CM | POA: Diagnosis not present

## 2017-01-02 DIAGNOSIS — H11041 Peripheral pterygium, stationary, right eye: Secondary | ICD-10-CM | POA: Diagnosis not present

## 2017-01-02 DIAGNOSIS — H26493 Other secondary cataract, bilateral: Secondary | ICD-10-CM | POA: Diagnosis not present

## 2017-01-02 DIAGNOSIS — H52223 Regular astigmatism, bilateral: Secondary | ICD-10-CM | POA: Diagnosis not present

## 2017-01-02 DIAGNOSIS — H04123 Dry eye syndrome of bilateral lacrimal glands: Secondary | ICD-10-CM | POA: Diagnosis not present

## 2017-01-03 ENCOUNTER — Other Ambulatory Visit: Payer: Self-pay

## 2017-01-03 NOTE — Telephone Encounter (Signed)
Looks like [place] saw her 07/06/12 and she had already been taking it and [place] wrote "Anxiety/depression Prozac managed by primary care. Uses occasional Xanax 0.25."  Do you want me to check with patient?:

## 2017-01-03 NOTE — Telephone Encounter (Signed)
Patient advised. She will check with PCP.

## 2017-01-03 NOTE — Telephone Encounter (Signed)
It sounds like her primary physician should be managing this in conjunction with her other medication.

## 2017-01-09 DIAGNOSIS — M1711 Unilateral primary osteoarthritis, right knee: Secondary | ICD-10-CM | POA: Diagnosis not present

## 2017-01-09 DIAGNOSIS — F324 Major depressive disorder, single episode, in partial remission: Secondary | ICD-10-CM | POA: Diagnosis not present

## 2017-01-09 DIAGNOSIS — F411 Generalized anxiety disorder: Secondary | ICD-10-CM | POA: Diagnosis not present

## 2017-03-09 ENCOUNTER — Other Ambulatory Visit: Payer: Self-pay | Admitting: Gynecology

## 2017-03-09 DIAGNOSIS — Z139 Encounter for screening, unspecified: Secondary | ICD-10-CM

## 2017-03-23 DIAGNOSIS — Z96651 Presence of right artificial knee joint: Secondary | ICD-10-CM | POA: Diagnosis not present

## 2017-03-23 DIAGNOSIS — Z96659 Presence of unspecified artificial knee joint: Secondary | ICD-10-CM | POA: Diagnosis not present

## 2017-03-29 ENCOUNTER — Ambulatory Visit: Payer: Medicare HMO

## 2017-04-12 ENCOUNTER — Ambulatory Visit: Payer: Self-pay

## 2017-05-08 ENCOUNTER — Ambulatory Visit: Payer: Self-pay

## 2017-05-08 ENCOUNTER — Ambulatory Visit: Payer: PPO | Admitting: Gynecology

## 2017-05-08 ENCOUNTER — Encounter: Payer: Self-pay | Admitting: Gynecology

## 2017-05-08 VITALS — BP 118/76 | Ht 60.0 in | Wt 151.0 lb

## 2017-05-08 DIAGNOSIS — N952 Postmenopausal atrophic vaginitis: Secondary | ICD-10-CM

## 2017-05-08 DIAGNOSIS — Z01411 Encounter for gynecological examination (general) (routine) with abnormal findings: Secondary | ICD-10-CM

## 2017-05-08 DIAGNOSIS — M8589 Other specified disorders of bone density and structure, multiple sites: Secondary | ICD-10-CM

## 2017-05-08 DIAGNOSIS — M8580 Other specified disorders of bone density and structure, unspecified site: Secondary | ICD-10-CM

## 2017-05-08 NOTE — Patient Instructions (Signed)
Follow-up in 1 year for annual exam, sooner if any issues. 

## 2017-05-08 NOTE — Progress Notes (Signed)
    JERUSHA REISING 1946-02-13 [id number]        72 y.o.  G3P3003 for breast and pelvic exam.  Without gynecologic complaints.  Husband recently diagnosed with pancreatic cancer and now is in the initial evaluation phase.  Past medical history,surgical history, problem list, medications, allergies, family history and social history were all reviewed and documented as reviewed in the EPIC chart.  ROS:  Performed with pertinent positives and negatives included in the history, assessment and plan.   Additional significant findings : None   Exam: [name] assistant Vitals:   05/08/17 1403  BP: 118/76  Weight: 151 lb (68.5 kg)  Height: 5' (1.524 m)   Body mass index is 29.49 kg/m.  General appearance:  Normal affect, orientation and appearance. Skin: Grossly normal HEENT: Without gross lesions.  No cervical or supraclavicular adenopathy. Thyroid normal.  Lungs:  Clear without wheezing, rales or rhonchi Cardiac: RR, without RMG Abdominal:  Soft, nontender, without masses, guarding, rebound, organomegaly or hernia Breasts:  Examined lying and sitting without masses, retractions, discharge or axillary adenopathy. Pelvic:  Ext, BUS, Vagina: With atrophic changes  Cervix: With atrophic changes  Uterus: Anteverted, normal size, shape and contour, midline and mobile nontender   Adnexa: Without masses or tenderness    Anus and perineum: Normal   Rectovaginal: Normal sphincter tone without palpated masses or tenderness.    Assessment/Plan:  72 y.o. G11P3003 female for breast and pelvic exam.   1. Postmenopausal/atrophic genital changes.  No significant hot flushes, night sweats, vaginal dryness or any vaginal bleeding.  Report any issues or bleeding. 2. Osteopenia.  DEXA 04/2016 T score -2.0 FRAX 6% / 1.2%.  Repeat DEXA next year at 2-year interval. 3. Mammography due now and patient is going to call and schedule.  Had benign biopsy last year.  Breast exam normal today. 4. Pap smear/HPV  2017.  No Pap smear done today.  No history of abnormal Pap smears previously.  Options to stop screening per current screening guidelines based on age versus less frequent screening intervals reviewed.  Will readdress on an annual basis. 5. Colonoscopy 2009.  Reminded patient she is due this year and she is going to arrange. 6. Health maintenance.  No routine lab work done as patient does this elsewhere.  Follow-up 1 year, sooner as needed.   Anastasio Auerbach MD, 2:33 PM 05/08/2017

## 2017-05-11 DIAGNOSIS — Z96651 Presence of right artificial knee joint: Secondary | ICD-10-CM | POA: Diagnosis not present

## 2017-05-11 DIAGNOSIS — M1711 Unilateral primary osteoarthritis, right knee: Secondary | ICD-10-CM | POA: Diagnosis not present

## 2017-05-11 DIAGNOSIS — Z471 Aftercare following joint replacement surgery: Secondary | ICD-10-CM | POA: Diagnosis not present

## 2017-05-12 ENCOUNTER — Ambulatory Visit
Admission: RE | Admit: 2017-05-12 | Discharge: 2017-05-12 | Disposition: A | Discharge status: Home / Self Care | Payer: PPO | Source: Ambulatory Visit | Attending: Gynecology | Admitting: Gynecology

## 2017-05-12 DIAGNOSIS — Z1231 Encounter for screening mammogram for malignant neoplasm of breast: Secondary | ICD-10-CM | POA: Diagnosis not present

## 2017-05-12 DIAGNOSIS — Z139 Encounter for screening, unspecified: Secondary | ICD-10-CM

## 2017-05-30 DIAGNOSIS — M5416 Radiculopathy, lumbar region: Secondary | ICD-10-CM | POA: Diagnosis not present

## 2017-06-11 ENCOUNTER — Emergency Department (HOSPITAL_COMMUNITY)
Admission: EM | Admit: 2017-06-11 | Discharge: 2017-06-11 | Disposition: A | Discharge status: Home / Self Care | Payer: PPO

## 2017-06-15 ENCOUNTER — Emergency Department (HOSPITAL_BASED_OUTPATIENT_CLINIC_OR_DEPARTMENT_OTHER): Payer: PPO

## 2017-06-15 ENCOUNTER — Encounter (HOSPITAL_BASED_OUTPATIENT_CLINIC_OR_DEPARTMENT_OTHER): Payer: Self-pay

## 2017-06-15 ENCOUNTER — Emergency Department (HOSPITAL_BASED_OUTPATIENT_CLINIC_OR_DEPARTMENT_OTHER)
Admission: EM | Admit: 2017-06-15 | Discharge: 2017-06-16 | Disposition: A | Discharge status: Home / Self Care | Payer: PPO | Attending: Emergency Medicine | Admitting: Emergency Medicine

## 2017-06-15 ENCOUNTER — Other Ambulatory Visit: Payer: Self-pay

## 2017-06-15 DIAGNOSIS — Y999 Unspecified external cause status: Secondary | ICD-10-CM | POA: Insufficient documentation

## 2017-06-15 DIAGNOSIS — Z96651 Presence of right artificial knee joint: Secondary | ICD-10-CM | POA: Insufficient documentation

## 2017-06-15 DIAGNOSIS — R0781 Pleurodynia: Secondary | ICD-10-CM | POA: Diagnosis not present

## 2017-06-15 DIAGNOSIS — Y929 Unspecified place or not applicable: Secondary | ICD-10-CM | POA: Diagnosis not present

## 2017-06-15 DIAGNOSIS — Z79899 Other long term (current) drug therapy: Secondary | ICD-10-CM | POA: Diagnosis not present

## 2017-06-15 DIAGNOSIS — W19XXXA Unspecified fall, initial encounter: Secondary | ICD-10-CM | POA: Insufficient documentation

## 2017-06-15 DIAGNOSIS — Y939 Activity, unspecified: Secondary | ICD-10-CM | POA: Diagnosis not present

## 2017-06-15 DIAGNOSIS — S2231XA Fracture of one rib, right side, initial encounter for closed fracture: Secondary | ICD-10-CM | POA: Diagnosis not present

## 2017-06-15 DIAGNOSIS — S299XXA Unspecified injury of thorax, initial encounter: Secondary | ICD-10-CM | POA: Diagnosis present

## 2017-06-15 NOTE — ED Triage Notes (Signed)
Pt states she fell 1 week ago-pain to right rib/flank area-did not seek medical attention-NAD-steady gait

## 2017-06-15 NOTE — ED Provider Notes (Signed)
[facility] Provider Note: Georgena Spurling, MD, FACEP  CSN: [id number] MRN: [id number] ARRIVAL: 06/15/17 at 2248 ROOM: [place] PRESENT ILLNESS  06/15/17 11:29 PM Pam [name] is a 72 y.o. female who fell 9 days ago.  She fell onto her left side but has been having right lower lateral rib pain ever since.  The pain is sharp and moderate to severe.  It is intermittent.  It is worse with movement or deep breathing.  She has been taking gabapentin left over from a prior surgery with some relief of the pain.  She came in tonight because family member insisted she get checked.  Consultation with the [facility] state controlled substances database reveals the patient has received 3 prescriptions for tramadol 3 prescriptions for hydromorphone in the past year, none since November of last year..   Past Medical History:  Diagnosis Date  . Anxiety   . Arthritis   . Costochondritis   . GERD (gastroesophageal reflux disease)    Had surgery for  . Osteopenia 04/2016   T score -2.0 FRAX 6%/1.2%  . PONV (postoperative nausea and vomiting)     Past Surgical History:  Procedure Laterality Date  . BREAST BIOPSY Left 2018   benign  . CHOLECYSTECTOMY    . ESOPHAGUS SURGERY  2008  . EXCISION/RELEASE BURSA HIP Right 10/19/2016   Procedure: Right hip bursectomy excision portion ileotibial band;  Surgeon: Latanya Maudlin, MD;  Location: [facility];  Service: Orthopedics;  Laterality: Right;  . KNEE SURGERY     Arthroscopic  right  . TOTAL KNEE ARTHROPLASTY Right 11/14/2016   Procedure: RIGHT TOTAL KNEE ARTHROPLASTY;  Surgeon: Gaynelle Arabian, MD;  Location: [facility];  Service: Orthopedics;  Laterality: Right;  . TUBAL LIGATION      Family History  Problem Relation Age of Onset  . Heart failure Brother   . Liver disease Brother   . Glaucoma Sister     Social History   Tobacco Use  . Smoking status: Never Smoker  . Smokeless tobacco:  Never Used  Substance Use Topics  . Alcohol use: Yes    Alcohol/week: 1.8 oz    Types: 3 Standard drinks or equivalent per week    Comment: OCCASIONAL  . Drug use: No    Prior to Admission medications   Medication Sig Start Date End Date Taking? Authorizing Provider  atorvastatin (LIPITOR) 20 MG tablet Take 20 mg by mouth daily.   Yes [provider]  Cetirizine HCl (ZYRTEC PO) Take 1 tablet by mouth daily as needed (allergies).    Yes [provider]  FLUoxetine (PROZAC) 20 MG tablet Take 40 mg by mouth daily.    Yes [provider]  Magnesium Oxide 250 MG TABS Take 250 mg by mouth every other day.    Yes [provider]  acetaminophen (TYLENOL) 500 MG tablet Take 1,000 mg by mouth every 8 (eight) hours as needed for mild pain or headache.    [provider]  ALPRAZolam (XANAX) 0.25 MG tablet TAKE 1 TABLET BY MOUTH AT BEDTIME AS NEEDED Patient taking differently: TAKE 1 TABLET BY MOUTH AT BEDTIME AS NEEDED FOR SLEEP 09/08/16   Huel Cote, NP  traMADol (ULTRAM) 50 MG tablet Take 1-2 tablets (50-100 mg total) by mouth every 6 (six) hours as needed for moderate pain. Patient not taking: Reported on 05/08/2017 11/15/16   Dara Lords, Alexzandrew L, PA-C    Allergies  Codeine and Macrodantin   REVIEW OF SYSTEMS  Negative except as noted here or in the History of Present Illness.   PHYSICAL EXAMINATION  Initial Vital Signs Blood pressure 126/78, pulse 93, temperature 98.8 F (37.1 C), temperature source Oral, resp. rate 20, height 5\' 1"  (1.549 m), weight 68.7 kg (151 lb 7.3 oz), SpO2 96 %.  Examination General: Well-developed, well-nourished female in no acute distress; appearance consistent with age of record HENT: normocephalic; atraumatic Eyes: pupils equal, round and reactive to light; extraocular muscles intact Neck: supple Heart: regular rate and rhythm Lungs: clear to auscultation bilaterally Chest: Right lower anterior lateral rib  tenderness without deformity or crepitus Abdomen: soft; nondistended; nontender; bowel sounds present Extremities: No deformity; full range of motion; pulses normal Neurologic: Awake, alert and oriented; motor function intact in all extremities and symmetric; no facial droop Skin: Warm and dry Psychiatric: Normal mood and affect   RESULTS  Summary of this visit's results, reviewed by myself:   EKG Interpretation  Date/Time:    Ventricular Rate:    PR Interval:    QRS Duration:   QT Interval:    QTC Calculation:   R Axis:     Text Interpretation:        Laboratory Studies: No results found for this or any previous visit (from the past 24 hour(s)). Imaging Studies: Dg Ribs Unilateral W/chest Right  Result Date: 06/16/2017 CLINICAL DATA:  [place] 1 week ago. Right lower posterior rib and flank pain. EXAM: RIGHT RIBS AND CHEST - 3+ VIEW COMPARISON:  Chest 09/05/2016 FINDINGS: Heart size and pulmonary vascularity are normal. There is some blunting of the right costophrenic angle suggesting fluid or thickened pleura. This is new since previous study. No airspace disease or consolidation in the lungs. No pneumothorax. Mediastinal contours appear intact. Mild thoracolumbar scoliosis convex towards the left. Surgical clips in the right upper quadrant. Irregularity in the right anterior ninth rib adjacent to the BB marker likely represents a nondisplaced fracture. No other fractures or focal bone lesions identified in the right ribs. IMPRESSION: Fluid or thickened pleura in the right costophrenic angle. Nondisplaced fracture of the right anterior ninth rib. Electronically Signed   By: Lucienne Capers M.D.   On: 06/16/2017 00:11    ED COURSE and MDM  Nursing notes and initial vitals signs, including pulse oximetry, reviewed.  Vitals:   06/15/17 2253 06/15/17 2256  BP:  126/78  Pulse:  93  Resp:  20  Temp:  98.8 F (37.1 C)  TempSrc:  Oral  SpO2:  96%  Weight: 68.7 kg (151 lb 7.3 oz)     Height: 5\' 1"  (1.549 m)     PROCEDURES    ED DIAGNOSES     ICD-10-CM   1. Closed fracture of one rib of right side, initial encounter S22.31XA        Menucha Dicesare, MD 06/16/17 7793

## 2017-06-16 DIAGNOSIS — S299XXA Unspecified injury of thorax, initial encounter: Secondary | ICD-10-CM | POA: Diagnosis not present

## 2017-06-16 DIAGNOSIS — R0781 Pleurodynia: Secondary | ICD-10-CM | POA: Diagnosis not present

## 2017-06-16 MED ORDER — OXYCODONE-ACETAMINOPHEN 5-325 MG PO TABS
2.0000 | ORAL_TABLET | Freq: Once | ORAL | Status: AC
  Administered 2017-06-16: 2 via ORAL
  Filled 2017-06-16: qty 2

## 2017-06-16 MED ORDER — HYDROCODONE-ACETAMINOPHEN 5-325 MG PO TABS: 1.0000 | ORAL_TABLET | ORAL | 0 refills | Status: DC | PRN

## 2017-06-16 MED ORDER — HYDROCODONE-ACETAMINOPHEN 5-325 MG PO TABS
1.0000 | ORAL_TABLET | Freq: Once | ORAL | Status: DC
  Filled 2017-06-16: qty 1

## 2017-06-16 MED ORDER — OXYCODONE-ACETAMINOPHEN 5-325 MG PO TABS: 1.0000 | ORAL_TABLET | ORAL | 0 refills | Status: DC | PRN

## 2017-06-16 NOTE — ED Notes (Signed)
ED Provider at bedside. 

## 2017-07-19 DIAGNOSIS — F324 Major depressive disorder, single episode, in partial remission: Secondary | ICD-10-CM | POA: Diagnosis not present

## 2017-07-19 DIAGNOSIS — L905 Scar conditions and fibrosis of skin: Secondary | ICD-10-CM | POA: Diagnosis not present

## 2017-07-19 DIAGNOSIS — M62830 Muscle spasm of back: Secondary | ICD-10-CM | POA: Diagnosis not present

## 2017-07-19 DIAGNOSIS — E785 Hyperlipidemia, unspecified: Secondary | ICD-10-CM | POA: Diagnosis not present

## 2017-07-19 DIAGNOSIS — F411 Generalized anxiety disorder: Secondary | ICD-10-CM | POA: Diagnosis not present

## 2017-07-19 DIAGNOSIS — M533 Sacrococcygeal disorders, not elsewhere classified: Secondary | ICD-10-CM | POA: Diagnosis not present

## 2017-07-19 DIAGNOSIS — M5416 Radiculopathy, lumbar region: Secondary | ICD-10-CM | POA: Diagnosis not present

## 2017-07-19 DIAGNOSIS — R946 Abnormal results of thyroid function studies: Secondary | ICD-10-CM | POA: Diagnosis not present

## 2017-07-19 DIAGNOSIS — M545 Low back pain: Secondary | ICD-10-CM | POA: Diagnosis not present

## 2017-07-26 DIAGNOSIS — M533 Sacrococcygeal disorders, not elsewhere classified: Secondary | ICD-10-CM | POA: Diagnosis not present

## 2017-08-29 DIAGNOSIS — M5416 Radiculopathy, lumbar region: Secondary | ICD-10-CM | POA: Diagnosis not present

## 2017-09-11 DIAGNOSIS — L905 Scar conditions and fibrosis of skin: Secondary | ICD-10-CM | POA: Diagnosis not present

## 2017-09-11 DIAGNOSIS — L818 Other specified disorders of pigmentation: Secondary | ICD-10-CM | POA: Diagnosis not present

## 2017-09-25 DIAGNOSIS — M5416 Radiculopathy, lumbar region: Secondary | ICD-10-CM | POA: Diagnosis not present

## 2017-09-30 DIAGNOSIS — M5416 Radiculopathy, lumbar region: Secondary | ICD-10-CM | POA: Diagnosis not present

## 2017-10-09 DIAGNOSIS — M5416 Radiculopathy, lumbar region: Secondary | ICD-10-CM | POA: Diagnosis not present

## 2017-10-17 DIAGNOSIS — M5416 Radiculopathy, lumbar region: Secondary | ICD-10-CM | POA: Diagnosis not present

## 2017-11-01 DIAGNOSIS — M5416 Radiculopathy, lumbar region: Secondary | ICD-10-CM | POA: Diagnosis not present

## 2017-11-02 DIAGNOSIS — M1711 Unilateral primary osteoarthritis, right knee: Secondary | ICD-10-CM | POA: Diagnosis not present

## 2017-11-07 DIAGNOSIS — M5416 Radiculopathy, lumbar region: Secondary | ICD-10-CM | POA: Diagnosis not present

## 2017-11-09 DIAGNOSIS — M5416 Radiculopathy, lumbar region: Secondary | ICD-10-CM | POA: Diagnosis not present

## 2017-11-14 DIAGNOSIS — Z86010 Personal history of colonic polyps: Secondary | ICD-10-CM | POA: Diagnosis not present

## 2017-11-14 DIAGNOSIS — R197 Diarrhea, unspecified: Secondary | ICD-10-CM | POA: Diagnosis not present

## 2017-11-14 DIAGNOSIS — F411 Generalized anxiety disorder: Secondary | ICD-10-CM | POA: Diagnosis not present

## 2017-11-14 DIAGNOSIS — K219 Gastro-esophageal reflux disease without esophagitis: Secondary | ICD-10-CM | POA: Diagnosis not present

## 2017-11-14 DIAGNOSIS — R1013 Epigastric pain: Secondary | ICD-10-CM | POA: Diagnosis not present

## 2017-11-14 DIAGNOSIS — M5416 Radiculopathy, lumbar region: Secondary | ICD-10-CM | POA: Diagnosis not present

## 2017-11-14 DIAGNOSIS — M797 Fibromyalgia: Secondary | ICD-10-CM | POA: Diagnosis not present

## 2017-11-14 DIAGNOSIS — F324 Major depressive disorder, single episode, in partial remission: Secondary | ICD-10-CM | POA: Diagnosis not present

## 2017-11-20 DIAGNOSIS — R197 Diarrhea, unspecified: Secondary | ICD-10-CM | POA: Diagnosis not present

## 2017-11-21 DIAGNOSIS — M5416 Radiculopathy, lumbar region: Secondary | ICD-10-CM | POA: Diagnosis not present

## 2017-11-28 DIAGNOSIS — M5416 Radiculopathy, lumbar region: Secondary | ICD-10-CM | POA: Diagnosis not present

## 2017-11-29 DIAGNOSIS — R140 Abdominal distension (gaseous): Secondary | ICD-10-CM | POA: Diagnosis not present

## 2017-11-29 DIAGNOSIS — R197 Diarrhea, unspecified: Secondary | ICD-10-CM | POA: Diagnosis not present

## 2017-11-29 DIAGNOSIS — K219 Gastro-esophageal reflux disease without esophagitis: Secondary | ICD-10-CM | POA: Diagnosis not present

## 2017-11-30 DIAGNOSIS — M797 Fibromyalgia: Secondary | ICD-10-CM | POA: Diagnosis not present

## 2017-11-30 DIAGNOSIS — R0789 Other chest pain: Secondary | ICD-10-CM | POA: Diagnosis not present

## 2017-12-13 DIAGNOSIS — N390 Urinary tract infection, site not specified: Secondary | ICD-10-CM | POA: Diagnosis not present

## 2017-12-14 DIAGNOSIS — R1013 Epigastric pain: Secondary | ICD-10-CM | POA: Diagnosis not present

## 2017-12-14 DIAGNOSIS — R110 Nausea: Secondary | ICD-10-CM | POA: Diagnosis not present

## 2017-12-14 DIAGNOSIS — Z86010 Personal history of colonic polyps: Secondary | ICD-10-CM | POA: Diagnosis not present

## 2017-12-14 DIAGNOSIS — F324 Major depressive disorder, single episode, in partial remission: Secondary | ICD-10-CM | POA: Diagnosis not present

## 2017-12-14 DIAGNOSIS — K219 Gastro-esophageal reflux disease without esophagitis: Secondary | ICD-10-CM | POA: Diagnosis not present

## 2017-12-14 DIAGNOSIS — M797 Fibromyalgia: Secondary | ICD-10-CM | POA: Diagnosis not present

## 2017-12-21 DIAGNOSIS — M67861 Other specified disorders of synovium, right knee: Secondary | ICD-10-CM | POA: Diagnosis not present

## 2017-12-21 DIAGNOSIS — Z471 Aftercare following joint replacement surgery: Secondary | ICD-10-CM | POA: Diagnosis not present

## 2017-12-21 DIAGNOSIS — Z96651 Presence of right artificial knee joint: Secondary | ICD-10-CM | POA: Diagnosis not present

## 2017-12-27 DIAGNOSIS — M25561 Pain in right knee: Secondary | ICD-10-CM | POA: Diagnosis not present

## 2017-12-27 DIAGNOSIS — M67863 Other specified disorders of tendon, right knee: Secondary | ICD-10-CM | POA: Diagnosis not present

## 2017-12-27 DIAGNOSIS — M76891 Other specified enthesopathies of right lower limb, excluding foot: Secondary | ICD-10-CM | POA: Diagnosis not present

## 2017-12-28 DIAGNOSIS — Z9889 Other specified postprocedural states: Secondary | ICD-10-CM | POA: Diagnosis not present

## 2017-12-28 DIAGNOSIS — K2930 Chronic superficial gastritis without bleeding: Secondary | ICD-10-CM | POA: Diagnosis not present

## 2017-12-28 DIAGNOSIS — R1013 Epigastric pain: Secondary | ICD-10-CM | POA: Diagnosis not present

## 2017-12-28 DIAGNOSIS — K219 Gastro-esophageal reflux disease without esophagitis: Secondary | ICD-10-CM | POA: Diagnosis not present

## 2017-12-28 DIAGNOSIS — K2970 Gastritis, unspecified, without bleeding: Secondary | ICD-10-CM | POA: Diagnosis not present

## 2017-12-30 DIAGNOSIS — R300 Dysuria: Secondary | ICD-10-CM | POA: Diagnosis not present

## 2018-01-01 ENCOUNTER — Emergency Department (HOSPITAL_COMMUNITY)
Admission: EM | Admit: 2018-01-01 | Discharge: 2018-01-02 | Disposition: A | Discharge status: Home / Self Care | Payer: PPO | Attending: Emergency Medicine | Admitting: Emergency Medicine

## 2018-01-01 ENCOUNTER — Encounter (HOSPITAL_COMMUNITY): Payer: Self-pay

## 2018-01-01 ENCOUNTER — Other Ambulatory Visit: Payer: Self-pay

## 2018-01-01 ENCOUNTER — Emergency Department (HOSPITAL_COMMUNITY): Payer: PPO

## 2018-01-01 DIAGNOSIS — Z79899 Other long term (current) drug therapy: Secondary | ICD-10-CM | POA: Insufficient documentation

## 2018-01-01 DIAGNOSIS — Z96651 Presence of right artificial knee joint: Secondary | ICD-10-CM | POA: Diagnosis not present

## 2018-01-01 DIAGNOSIS — R109 Unspecified abdominal pain: Secondary | ICD-10-CM | POA: Insufficient documentation

## 2018-01-01 DIAGNOSIS — N281 Cyst of kidney, acquired: Secondary | ICD-10-CM | POA: Diagnosis not present

## 2018-01-01 DIAGNOSIS — N3000 Acute cystitis without hematuria: Secondary | ICD-10-CM

## 2018-01-01 DIAGNOSIS — R300 Dysuria: Secondary | ICD-10-CM | POA: Diagnosis present

## 2018-01-01 LAB — COMPREHENSIVE METABOLIC PANEL
ALBUMIN: 3.9 g/dL (ref 3.5–5.0)
ALT: 24 U/L (ref 0–44)
AST: 23 U/L (ref 15–41)
Alkaline Phosphatase: 96 U/L (ref 38–126)
Anion gap: 9 (ref 5–15)
BILIRUBIN TOTAL: 0.9 mg/dL (ref 0.3–1.2)
BUN: 13 mg/dL (ref 8–23)
CALCIUM: 9.6 mg/dL (ref 8.9–10.3)
CO2: 23 mmol/L (ref 22–32)
CREATININE: 0.87 mg/dL (ref 0.44–1.00)
Chloride: 107 mmol/L (ref 98–111)
GFR calc Af Amer: >60 mL/min (ref >60)
GFR calc non Af Amer: >60 mL/min (ref >60)
GLUCOSE: 107 mg/dL — AB (ref 70–99)
Potassium: 4 mmol/L (ref 3.5–5.1)
SODIUM: 139 mmol/L (ref 135–145)
Total Protein: 6.6 g/dL (ref 6.5–8.1)

## 2018-01-01 LAB — URINALYSIS, ROUTINE W REFLEX MICROSCOPIC
BILIRUBIN URINE: NEGATIVE
Glucose, UA: NEGATIVE mg/dL
Hgb urine dipstick: NEGATIVE
Ketones, ur: NEGATIVE mg/dL
Leukocytes, UA: NEGATIVE
NITRITE: POSITIVE — AB
PH: 6 (ref 5.0–8.0)
Protein, ur: NEGATIVE mg/dL
Specific Gravity, Urine: 1.009 (ref 1.005–1.030)

## 2018-01-01 LAB — CBC WITH DIFFERENTIAL/PLATELET
Abs Immature Granulocytes: 0.02 10*3/uL (ref 0.00–0.07)
BASOS PCT: 0 %
Basophils Absolute: 0 10*3/uL (ref 0.0–0.1)
Eosinophils Absolute: 0.1 10*3/uL (ref 0.0–0.5)
Eosinophils Relative: 2 %
HCT: 41.6 % (ref 36.0–46.0)
HEMOGLOBIN: 13.2 g/dL (ref 12.0–15.0)
IMMATURE GRANULOCYTES: 0 %
LYMPHS PCT: 46 %
Lymphs Abs: 3.5 10*3/uL (ref 0.7–4.0)
MCH: 30.8 pg (ref 26.0–34.0)
MCHC: 31.7 g/dL (ref 30.0–36.0)
MCV: 97.2 fL (ref 80.0–100.0)
MONO ABS: 0.6 10*3/uL (ref 0.1–1.0)
MONOS PCT: 8 %
NEUTROS ABS: 3.4 10*3/uL (ref 1.7–7.7)
NRBC: 0 % (ref 0.0–0.2)
Neutrophils Relative %: 44 %
PLATELETS: 309 10*3/uL (ref 150–400)
RBC: 4.28 MIL/uL (ref 3.87–5.11)
RDW: 14.2 % (ref 11.5–15.5)
WBC: 7.7 10*3/uL (ref 4.0–10.5)

## 2018-01-01 MED ORDER — CEPHALEXIN 500 MG PO CAPS: 500.0000 mg | ORAL_CAPSULE | Freq: Two times a day (BID) | ORAL | 0 refills | Status: AC

## 2018-01-01 MED ORDER — PHENAZOPYRIDINE HCL 200 MG PO TABS: 200.0000 mg | ORAL_TABLET | Freq: Three times a day (TID) | ORAL | 0 refills | Status: DC

## 2018-01-01 MED ORDER — IOHEXOL 300 MG/ML  SOLN
100.0000 mL | Freq: Once | INTRAMUSCULAR | Status: AC | PRN
  Administered 2018-01-01: 100 mL via INTRAVENOUS

## 2018-01-01 MED ORDER — FENTANYL CITRATE (PF) 100 MCG/2ML IJ SOLN
50.0000 ug | Freq: Once | INTRAMUSCULAR | Status: AC
  Administered 2018-01-01: 50 ug via INTRAVENOUS
  Filled 2018-01-01: qty 2

## 2018-01-01 MED ORDER — ONDANSETRON 4 MG PO TBDP: 4.0000 mg | ORAL_TABLET | Freq: Three times a day (TID) | ORAL | 0 refills | Status: DC | PRN

## 2018-01-01 NOTE — ED Triage Notes (Signed)
Pt from home w/ a c/o dysuria for 3 weeks. Complaints of a burning, pressure every time she urinates. No blood noted. Additional complaints of left sided flank pain that is constant and radiates into her pelvis. Pt reports that she has tested neg for a UTI on two separate occassions but has been given an antibiotic to tx prophylactically for UTI like symptoms. Her PCP has referred her to a urologists but she has been unable to get an appt and has come her for severe pain.

## 2018-01-01 NOTE — Discharge Instructions (Addendum)
Please read instructions below. Take your antibiotic, keflex, as directed until it is gone. You can take pyridium as prescribed for bladder discomfort.  Be aware this medication can make your secretions orange.  This is a normal side effect. You can take Zofran every 8 hours as needed for nausea. Drink plenty of water. Attend your follow-up appointment with the urologist this week. Return to the ER if you develop a fever, uncontrollable vomiting,  or new or concerning symptoms.

## 2018-01-01 NOTE — ED Provider Notes (Signed)
[facility] Provider Note   CSN: [id number] Arrival date & time: 01/01/18  9326     History   Chief Complaint Chief Complaint  Patient presents with  . Dysuria    HPI Pam [name] is a 72 y.o. female with past medical history of GERD, presenting to the emergency department with complaint of multiple weeks of persistent worsening suprapubic abdominal pain.  She states pain feels like a pressure and burning.  She was evaluated by her primary care provider on October 22 who prescribed her Macrobid for suspected UTI.  She states however the urine culture came back negative and she did not in fact have a UTI.  She went back to her primary care again for persistent pain and they prescribed her Tolterodine she has not worked.  Also taking AZO. she states pain is worsening.  She has associated urinary urgency and increased frequency.  She denies pain with urination.  Today she developed some nausea and chills with some left-sided back pain.  Denies hematuria or history of stone.  Reports as a child she had issues with recurrent UTI.  The history is provided by the patient.    Past Medical History:  Diagnosis Date  . Anxiety   . Arthritis   . Costochondritis   . GERD (gastroesophageal reflux disease)    Had surgery for  . Osteopenia 04/2016   T score -2.0 FRAX 6%/1.2%  . PONV (postoperative nausea and vomiting)     Patient Active Problem List   Diagnosis Date Noted  . OA (osteoarthritis) of knee 11/14/2016  . Greater trochanteric bursitis of right hip 10/19/2016  . Osteopenia 07/06/2012  . Acid reflux     Past Surgical History:  Procedure Laterality Date  . BREAST BIOPSY Left 2018   benign  . CHOLECYSTECTOMY    . ESOPHAGUS SURGERY  2008  . EXCISION/RELEASE BURSA HIP Right 10/19/2016   Procedure: Right hip bursectomy excision portion ileotibial band;  Surgeon: Latanya Maudlin, MD;  Location: [facility];  Service: Orthopedics;  Laterality:  Right;  . KNEE SURGERY     Arthroscopic  right  . TOTAL KNEE ARTHROPLASTY Right 11/14/2016   Procedure: RIGHT TOTAL KNEE ARTHROPLASTY;  Surgeon: Gaynelle Arabian, MD;  Location: [facility];  Service: Orthopedics;  Laterality: Right;  . TUBAL LIGATION       OB History    Gravida  3   Para  3   Term  3   Preterm      AB      Living  3     SAB      TAB      Ectopic      Multiple      Live Births               Home Medications    Prior to Admission medications   Medication Sig Start Date End Date Taking? Authorizing Provider  ALPRAZolam (XANAX) 0.25 MG tablet TAKE 1 TABLET BY MOUTH AT BEDTIME AS NEEDED Patient taking differently: TAKE 1 TABLET BY MOUTH AT BEDTIME AS NEEDED FOR SLEEP 09/08/16   Huel Cote, NP  atorvastatin (LIPITOR) 20 MG tablet Take 20 mg by mouth daily.    [provider]  cephALEXin (KEFLEX) 500 MG capsule Take 1 capsule (500 mg total) by mouth 2 (two) times daily for 7 days. 01/01/18 01/08/18  Jahir Halt, [place] N, PA-C  Cetirizine HCl (ZYRTEC PO) Take 1 tablet by mouth daily as needed (allergies).  [provider]  FLUoxetine (PROZAC) 20 MG tablet Take 40 mg by mouth daily.     [provider]  Magnesium Oxide 250 MG TABS Take 250 mg by mouth every other day.     [provider]  ondansetron (ZOFRAN ODT) 4 MG disintegrating tablet Take 1 tablet (4 mg total) by mouth every 8 (eight) hours as needed for nausea or vomiting. 01/01/18   Sherrin Stahle, [place] N, PA-C  oxyCODONE-acetaminophen (PERCOCET) 5-325 MG tablet Take 1 tablet by mouth every 4 (four) hours as needed (for rib pain). 06/16/17   Molpus, John, MD  phenazopyridine (PYRIDIUM) 200 MG tablet Take 1 tablet (200 mg total) by mouth 3 (three) times daily. 01/01/18   Enez Monahan, [place] N, PA-C    Family History Family History  Problem Relation Age of Onset  . Heart failure Brother   . Liver disease Brother   . Glaucoma Sister     Social History Social History     Tobacco Use  . Smoking status: Never Smoker  . Smokeless tobacco: Never Used  Substance Use Topics  . Alcohol use: Yes    Alcohol/week: 3.0 standard drinks    Types: 3 Standard drinks or equivalent per week    Comment: OCCASIONAL  . Drug use: No     Allergies   Hydrocodone; Codeine; and Macrodantin   Review of Systems Review of Systems  Constitutional: Positive for chills. Negative for fever.  Gastrointestinal: Positive for abdominal pain and nausea. Negative for vomiting.  Genitourinary: Positive for flank pain, frequency and urgency. Negative for dysuria, hematuria, vaginal bleeding and vaginal discharge.  All other systems reviewed and are negative.    Physical Exam Updated Vital Signs BP (!) 130/57 (BP Location: Right Arm)   Pulse 67   Temp 98.2 F (36.8 C) (Oral)   Resp 16   Ht 5\' 1"  (1.549 m)   Wt 67.6 kg   SpO2 98%   BMI 28.15 kg/m   Physical Exam  Constitutional: She appears well-developed and well-nourished. She does not appear ill. No distress.  HENT:  Head: Normocephalic and atraumatic.  Eyes: Conjunctivae are normal.  Cardiovascular: Normal rate, regular rhythm and normal heart sounds.  Pulmonary/Chest: Effort normal and breath sounds normal. No respiratory distress.  Abdominal: Soft. Normal appearance and bowel sounds are normal. She exhibits no distension and no mass. There is tenderness in the suprapubic area. There is CVA tenderness (left). There is no rebound and no guarding.  Neurological: She is alert.  Skin: Skin is warm.  Psychiatric: She has a normal mood and affect. Her behavior is normal.  Nursing note and vitals reviewed.    ED Treatments / Results  Labs (all labs ordered are listed, but only abnormal results are displayed) Labs Reviewed  URINALYSIS, ROUTINE W REFLEX MICROSCOPIC - Abnormal; Notable for the following components:      Result Value   Color, Urine AMBER (*)    Nitrite POSITIVE (*)    Bacteria, UA RARE (*)    All  other components within normal limits  COMPREHENSIVE METABOLIC PANEL - Abnormal; Notable for the following components:   Glucose, Bld 107 (*)    All other components within normal limits  CBC WITH DIFFERENTIAL/PLATELET    EKG None  Radiology Ct Abdomen Pelvis W Contrast  Result Date: 01/01/2018 CLINICAL DATA:  Subacute onset of dysuria and increased urinary pressure. Left-sided flank pain, radiating to the pelvis. EXAM: CT ABDOMEN AND PELVIS WITH CONTRAST TECHNIQUE: Multidetector CT imaging of the abdomen  and pelvis was performed using the standard protocol following bolus administration of intravenous contrast. CONTRAST:  182mL OMNIPAQUE IOHEXOL 300 MG/ML  SOLN COMPARISON:  CT of the abdomen and pelvis from 03/03/2010 FINDINGS: Lower chest: Minimal bibasilar atelectasis is noted. The visualized portions of the mediastinum are unremarkable. Hepatobiliary: The liver is unremarkable in appearance. The patient is status post cholecystectomy, with clips noted at the gallbladder fossa. The common bile duct remains normal in caliber. Pancreas: The pancreas is within normal limits. Spleen: The spleen is unremarkable in appearance. Adrenals/Urinary Tract: The adrenal glands are unremarkable in appearance. A right renal cyst is noted. There is no evidence of hydronephrosis. No renal or ureteral stones are identified. No perinephric stranding is seen. Stomach/Bowel: The stomach is unremarkable in appearance. The small bowel is within normal limits. The appendix is not visualized; there is no evidence for appendicitis. The colon is unremarkable in appearance. Vascular/Lymphatic: Minimal calcification is seen along the abdominal aorta. The inferior vena cava is grossly unremarkable. No retroperitoneal lymphadenopathy is seen. No pelvic sidewall lymphadenopathy is identified. Reproductive: The bladder is mildly distended and grossly unremarkable. The uterus is unremarkable in appearance. The ovaries are grossly  symmetric. No suspicious adnexal masses are seen. Other: No additional soft tissue abnormalities are seen. Musculoskeletal: No acute osseous abnormalities are identified. Multilevel vacuum phenomenon is noted along the lower lumbar spine. The visualized musculature is unremarkable in appearance. IMPRESSION: 1. No acute abnormality seen within the abdomen or pelvis. 2. Right renal cyst noted. 3. Mild degenerative change along the lower lumbar spine. Electronically Signed   By: Garald Balding M.D.   On: 01/01/2018 22:49    Procedures Procedures (including critical care time)  Medications Ordered in ED Medications  fentaNYL (SUBLIMAZE) injection 50 mcg (50 mcg Intravenous Given 01/01/18 2109)  iohexol (OMNIPAQUE) 300 MG/ML solution 100 mL (100 mLs Intravenous Contrast Given 01/01/18 2222)     Initial Impression / Assessment and Plan / ED Course  I have reviewed the triage vital signs and the nursing notes.  Pertinent labs & imaging results that were available during my care of the patient were reviewed by me and considered in my medical decision making (see chart for details).     Patient presenting with persistent and worsening "bladder discomfort.",  Described as a burning and pressure sensation in the suprapubic region.  She has associated urinary urgency and frequency without dysuria.  Today she developed chills with nausea and some back pain.  Treated by her PCP at the end of October with Macrobid however her urine culture was reportedly negative.  She was also prescribed medication for bladder spasm however has not provided much relief.  On exam, she is afebrile with normal vital signs.  Her abdomen is soft without peritoneal signs.  There is some tenderness in the suprapubic region.  She is well-appearing and does not appear toxic.  Pain treated.  Labs and UA ordered.  CT abdomen pelvis to rule out any acute pathology.  CMP, CBC are unremarkable.  UA with nitrites and rare bacteria.  Urine  culture sent.  CT abdomen pelvis is negative. Patient discussed with Dr. Sherry Ruffing.  Plan to treat for mild UTI and recommend close follow-up.    Patient reevaluated reports improvement in symptoms.  Discussed reassuring results and plan.  Patient states she has a urology appointment on Wednesday.  Recommend she attend this appointment.  Will prescribe Keflex and Pyridium for symptoms.  Over-the-counter medication indicated for pain.  Patient agreeable to plan and  safe for discharge.  Discussed strict return precautions and verbalized understanding.  Discussed results, findings, treatment and follow up. Patient advised of return precautions. Patient verbalized understanding and agreed with plan.  Final Clinical Impressions(s) / ED Diagnoses   Final diagnoses:  Acute cystitis without hematuria    ED Discharge Orders         Ordered    phenazopyridine (PYRIDIUM) 200 MG tablet  3 times daily     01/01/18 2355    cephALEXin (KEFLEX) 500 MG capsule  2 times daily     01/01/18 2355    ondansetron (ZOFRAN ODT) 4 MG disintegrating tablet  Every 8 hours PRN     01/01/18 2355           Marga Gramajo, [place] N, PA-C 01/02/18 0125    Tegeler, Gwenyth Allegra, MD 01/02/18 [phone]

## 2018-01-01 NOTE — ED Notes (Signed)
Patient transported to CT 

## 2018-01-03 DIAGNOSIS — N390 Urinary tract infection, site not specified: Secondary | ICD-10-CM | POA: Diagnosis not present

## 2018-01-03 DIAGNOSIS — K2930 Chronic superficial gastritis without bleeding: Secondary | ICD-10-CM | POA: Diagnosis not present

## 2018-01-03 DIAGNOSIS — R350 Frequency of micturition: Secondary | ICD-10-CM | POA: Diagnosis not present

## 2018-01-03 DIAGNOSIS — R351 Nocturia: Secondary | ICD-10-CM | POA: Diagnosis not present

## 2018-01-05 ENCOUNTER — Ambulatory Visit (INDEPENDENT_AMBULATORY_CARE_PROVIDER_SITE_OTHER): Payer: PPO | Admitting: Gynecology

## 2018-01-05 ENCOUNTER — Encounter: Payer: Self-pay | Admitting: Gynecology

## 2018-01-05 VITALS — BP 124/74

## 2018-01-05 DIAGNOSIS — R300 Dysuria: Secondary | ICD-10-CM | POA: Diagnosis not present

## 2018-01-05 DIAGNOSIS — R102 Pelvic and perineal pain: Secondary | ICD-10-CM

## 2018-01-05 NOTE — Progress Notes (Signed)
    Pam [name] [birth year] [id number]        72 y.o.  G3P3003 presents with a history of lower abdominal/suprapubic pain with urinary frequency urgency and dysuria.  Was treated with oral antibiotic by her primary physician.  The pain worsened and she was seen in the emergency department where a different antibiotic was prescribed.  She subsequently saw urology to include cystoscopy by her history.  A CT scan was performed which was unremarkable to include a normal uterus/ovaries/adnexal regions.  No adenopathy or ascites.  White count during this evaluation was normal at 7.  Her pain has persisted and she wanted to follow-up to make sure it was not from a GYN etiology.  No abnormal vaginal discharge or odor.  Some nausea on and off although is able to eat.  No diarrhea or constipation.  Past medical history,surgical history, problem list, medications, allergies, family history and social history were all reviewed and documented in the EPIC chart.  Directed ROS with pertinent positives and negatives documented in the history of present illness/assessment and plan.  Exam: [name] assistant Vitals:   01/05/18 1526  BP: 124/74   General appearance:  Normal Abdomen soft with mild suprapubic tenderness.  No rebound or guarding.  No masses palpated. Pelvic external BUS vagina with atrophic changes.  Cervix with atrophic changes.  No cervical motion tenderness.  Uterus difficult to palpate but grossly normal size midline mobile.  Adnexa without gross masses or tenderness.  Rectal exam is normal.  Assessment/Plan:  72 y.o. [id number] with lower abdominal pain with urinary type symptoms.  On second course of antibiotics.  Do not feel it is of GYN etiology noting normal exam and negative CT scan for pelvic pathology.  I recommended she follow-up with urology if her pain persists.    Anastasio Auerbach MD, 3:51 PM 01/05/2018

## 2018-01-05 NOTE — Patient Instructions (Signed)
Follow-up with urology if your pain persists.

## 2018-01-06 LAB — URINALYSIS, COMPLETE W/RFL CULTURE
BILIRUBIN URINE: NEGATIVE
Hgb urine dipstick: NEGATIVE
Hyaline Cast: NONE SEEN /LPF
Ketones, ur: NEGATIVE
LEUKOCYTE ESTERASE: NEGATIVE
Nitrites, Initial: POSITIVE — AB
Protein, ur: NEGATIVE
RBC / HPF: NONE SEEN /HPF (ref 0–2)
SPECIFIC GRAVITY, URINE: 1.006 (ref 1.001–1.03)
pH: 5.5 (ref 5.0–8.0)

## 2018-01-06 LAB — URINE CULTURE
MICRO NUMBER:: 91381155
Result:: NO GROWTH
SPECIMEN QUALITY:: ADEQUATE

## 2018-01-06 LAB — CULTURE INDICATED

## 2018-01-08 DIAGNOSIS — M25561 Pain in right knee: Secondary | ICD-10-CM | POA: Diagnosis not present

## 2018-01-10 DIAGNOSIS — R350 Frequency of micturition: Secondary | ICD-10-CM | POA: Diagnosis not present

## 2018-01-10 DIAGNOSIS — R102 Pelvic and perineal pain: Secondary | ICD-10-CM | POA: Diagnosis not present

## 2018-01-12 DIAGNOSIS — M62838 Other muscle spasm: Secondary | ICD-10-CM | POA: Diagnosis not present

## 2018-01-12 DIAGNOSIS — K5900 Constipation, unspecified: Secondary | ICD-10-CM | POA: Diagnosis not present

## 2018-01-12 DIAGNOSIS — R151 Fecal smearing: Secondary | ICD-10-CM | POA: Diagnosis not present

## 2018-01-12 DIAGNOSIS — M6281 Muscle weakness (generalized): Secondary | ICD-10-CM | POA: Diagnosis not present

## 2018-01-23 DIAGNOSIS — M25561 Pain in right knee: Secondary | ICD-10-CM | POA: Diagnosis not present

## 2018-04-16 ENCOUNTER — Other Ambulatory Visit: Payer: Self-pay | Admitting: Gynecology

## 2018-04-16 DIAGNOSIS — Z1231 Encounter for screening mammogram for malignant neoplasm of breast: Secondary | ICD-10-CM

## 2018-05-10 ENCOUNTER — Other Ambulatory Visit: Payer: Self-pay

## 2018-05-14 ENCOUNTER — Encounter: Payer: PPO | Admitting: Gynecology

## 2018-05-15 ENCOUNTER — Ambulatory Visit: Payer: PPO

## 2018-06-18 ENCOUNTER — Encounter: Payer: Self-pay | Admitting: Gynecology

## 2018-07-05 ENCOUNTER — Ambulatory Visit: Payer: PPO

## 2018-07-17 ENCOUNTER — Ambulatory Visit: Payer: Medicare Other | Admitting: Gynecology

## 2018-07-17 ENCOUNTER — Other Ambulatory Visit: Payer: Self-pay

## 2018-07-17 ENCOUNTER — Encounter: Payer: Self-pay | Admitting: Gynecology

## 2018-07-17 VITALS — BP 118/74 | Ht 60.0 in | Wt 151.0 lb

## 2018-07-17 DIAGNOSIS — N3010 Interstitial cystitis (chronic) without hematuria: Secondary | ICD-10-CM | POA: Diagnosis not present

## 2018-07-17 DIAGNOSIS — N952 Postmenopausal atrophic vaginitis: Secondary | ICD-10-CM | POA: Diagnosis not present

## 2018-07-17 DIAGNOSIS — N898 Other specified noninflammatory disorders of vagina: Secondary | ICD-10-CM

## 2018-07-17 DIAGNOSIS — Z01419 Encounter for gynecological examination (general) (routine) without abnormal findings: Secondary | ICD-10-CM | POA: Diagnosis not present

## 2018-07-17 DIAGNOSIS — M8588 Other specified disorders of bone density and structure, other site: Secondary | ICD-10-CM | POA: Diagnosis not present

## 2018-07-17 LAB — WET PREP FOR TRICH, YEAST, CLUE

## 2018-07-17 MED ORDER — FLUCONAZOLE 150 MG PO TABS: 150.0000 mg | ORAL_TABLET | Freq: Once | ORAL | 0 refills | Status: AC

## 2018-07-17 NOTE — Patient Instructions (Signed)
Follow-up for the bone density as scheduled.  Schedule your mammogram.  Call your gastroenterologist to verify when your last colonoscopy was and when they want to repeat it.  Follow-up with urology in reference to your bladder symptoms.  Follow-up in 1 year for annual exam.

## 2018-07-17 NOTE — Addendum Note (Signed)
Addended by: Nelva Nay on: 07/17/2018 12:51 PM   Modules accepted: Orders

## 2018-07-17 NOTE — Progress Notes (Signed)
    Pam [name] [birth year] [id number]        72 y.o.  G3P3003 for annual gynecologic exam.  Also complaining of vaginal itching on and off over the last several weeks.  No significant discharge or odor.  Having a lot of bladder symptoms that she attributes to her interstitial cystitis and has an appointment to see Dr. Bernerd Limbo this afternoon.  Past medical history,surgical history, problem list, medications, allergies, family history and social history were all reviewed and documented as reviewed in the EPIC chart.  ROS:  Performed with pertinent positives and negatives included in the history, assessment and plan.   Additional significant findings : None   Exam: [name] assistant Vitals:   07/17/18 1154  BP: 118/74  Weight: 151 lb (68.5 kg)  Height: 5' (1.524 m)   Body mass index is 29.49 kg/m.  General appearance:  Normal affect, orientation and appearance. Skin: Grossly normal HEENT: Without gross lesions.  No cervical or supraclavicular adenopathy. Thyroid normal.  Lungs:  Clear without wheezing, rales or rhonchi Cardiac: RR, without RMG Abdominal:  Soft, nontender, without masses, guarding, rebound, organomegaly or hernia Breasts:  Examined lying and sitting without masses, retractions, discharge or axillary adenopathy. Pelvic:  Ext, BUS, Vagina: With atrophic changes.  Slight white discharge noted  Cervix: With atrophic changes  Uterus: Anteverted, normal size, shape and contour, midline and mobile nontender   Adnexa: Without masses or tenderness    Anus and perineum: Normal   Rectovaginal: Normal sphincter tone without palpated masses or tenderness.    Assessment/Plan:  73 y.o. G15P3003 female for annual gynecologic exam.   1. Bladder symptoms.  Has appointment with Dr. Bernerd Limbo this afternoon and will follow-up with him.  Urine analysis shows 6-10 WBC, 6-10 squamous cells and few bacteria.  Will culture and await results and treat if needed. 2. Vaginal  itching with slight discharge.  Wet prep is negative.  Given her symptoms I am going to cover her with Diflucan 150 mg x 1 dose.  She will follow-up if her symptoms persist. 3. Osteopenia.  DEXA 2018 T score -2 FRAX 6% / 1.2%.  Recommend DEXA this coming summer and she is going to schedule and follow-up for this. 4. Mammography due now and she knows to schedule when she is able.  Breast exam normal today. 5. Colonoscopy initially reported 2009.  On questioning she feels she had at since then but cannot remember when.  She is going to call her gastroenterologist office to verify when she had a done and when she needs to repeat it. 6. Pap smear/HPV 2017.  No Pap smear done today.  No history of abnormal Pap smears previously.  Current screening guidelines reviewed.  Will readdress on annual basis. 7. Health maintenance.  No routine lab work done as patient does this elsewhere.  Follow-up 1 year, sooner as needed.   Anastasio Auerbach MD, 12:12 PM 07/17/2018

## 2018-07-18 ENCOUNTER — Ambulatory Visit: Payer: PPO

## 2018-07-19 LAB — URINALYSIS, COMPLETE W/RFL CULTURE
Bilirubin Urine: NEGATIVE
Glucose, UA: NEGATIVE
Hgb urine dipstick: NEGATIVE
Hyaline Cast: NONE SEEN /LPF
Ketones, ur: NEGATIVE
Nitrites, Initial: NEGATIVE
Protein, ur: NEGATIVE
RBC / HPF: NONE SEEN /HPF (ref 0–2)
Specific Gravity, Urine: 1.015 (ref 1.001–1.03)
pH: 7.5 (ref 5.0–8.0)

## 2018-07-19 LAB — URINE CULTURE
MICRO NUMBER:: 509560
Result:: NO GROWTH
SPECIMEN QUALITY:: ADEQUATE

## 2018-07-19 LAB — CULTURE INDICATED

## 2018-08-28 ENCOUNTER — Other Ambulatory Visit: Payer: Self-pay

## 2018-08-29 ENCOUNTER — Other Ambulatory Visit: Payer: Self-pay | Admitting: Gynecology

## 2018-08-29 ENCOUNTER — Ambulatory Visit (INDEPENDENT_AMBULATORY_CARE_PROVIDER_SITE_OTHER): Payer: Medicare Other

## 2018-08-29 DIAGNOSIS — Z780 Asymptomatic menopausal state: Secondary | ICD-10-CM

## 2018-08-29 DIAGNOSIS — M8589 Other specified disorders of bone density and structure, multiple sites: Secondary | ICD-10-CM

## 2018-08-29 DIAGNOSIS — M8588 Other specified disorders of bone density and structure, other site: Secondary | ICD-10-CM

## 2018-08-30 ENCOUNTER — Encounter: Payer: Self-pay | Admitting: Gynecology

## 2018-09-03 ENCOUNTER — Other Ambulatory Visit: Payer: Self-pay

## 2018-09-03 ENCOUNTER — Ambulatory Visit
Admission: RE | Admit: 2018-09-03 | Discharge: 2018-09-03 | Disposition: A | Discharge status: Home / Self Care | Payer: Medicare Other | Source: Ambulatory Visit | Attending: Gynecology | Admitting: Gynecology

## 2018-09-03 DIAGNOSIS — Z1231 Encounter for screening mammogram for malignant neoplasm of breast: Secondary | ICD-10-CM

## 2018-11-28 ENCOUNTER — Encounter: Payer: Self-pay | Admitting: Gynecology

## 2019-05-01 ENCOUNTER — Ambulatory Visit (HOSPITAL_BASED_OUTPATIENT_CLINIC_OR_DEPARTMENT_OTHER)
Admission: RE | Admit: 2019-05-01 | Discharge: 2019-05-01 | Disposition: A | Discharge status: Home / Self Care | Payer: Medicare Other | Source: Ambulatory Visit | Attending: Sports Medicine | Admitting: Sports Medicine

## 2019-05-01 ENCOUNTER — Other Ambulatory Visit: Payer: Self-pay

## 2019-05-01 ENCOUNTER — Other Ambulatory Visit (HOSPITAL_BASED_OUTPATIENT_CLINIC_OR_DEPARTMENT_OTHER): Payer: Self-pay | Admitting: Sports Medicine

## 2019-05-01 DIAGNOSIS — M7989 Other specified soft tissue disorders: Secondary | ICD-10-CM

## 2019-07-11 IMAGING — CR DG CHEST 2V
2 series · 2 of 2 positions shown · non-contrast
Comparison: 08/31/2013

CLINICAL DATA: Short of breath

EXAM:
CHEST  2 VIEW

[w chest pa]
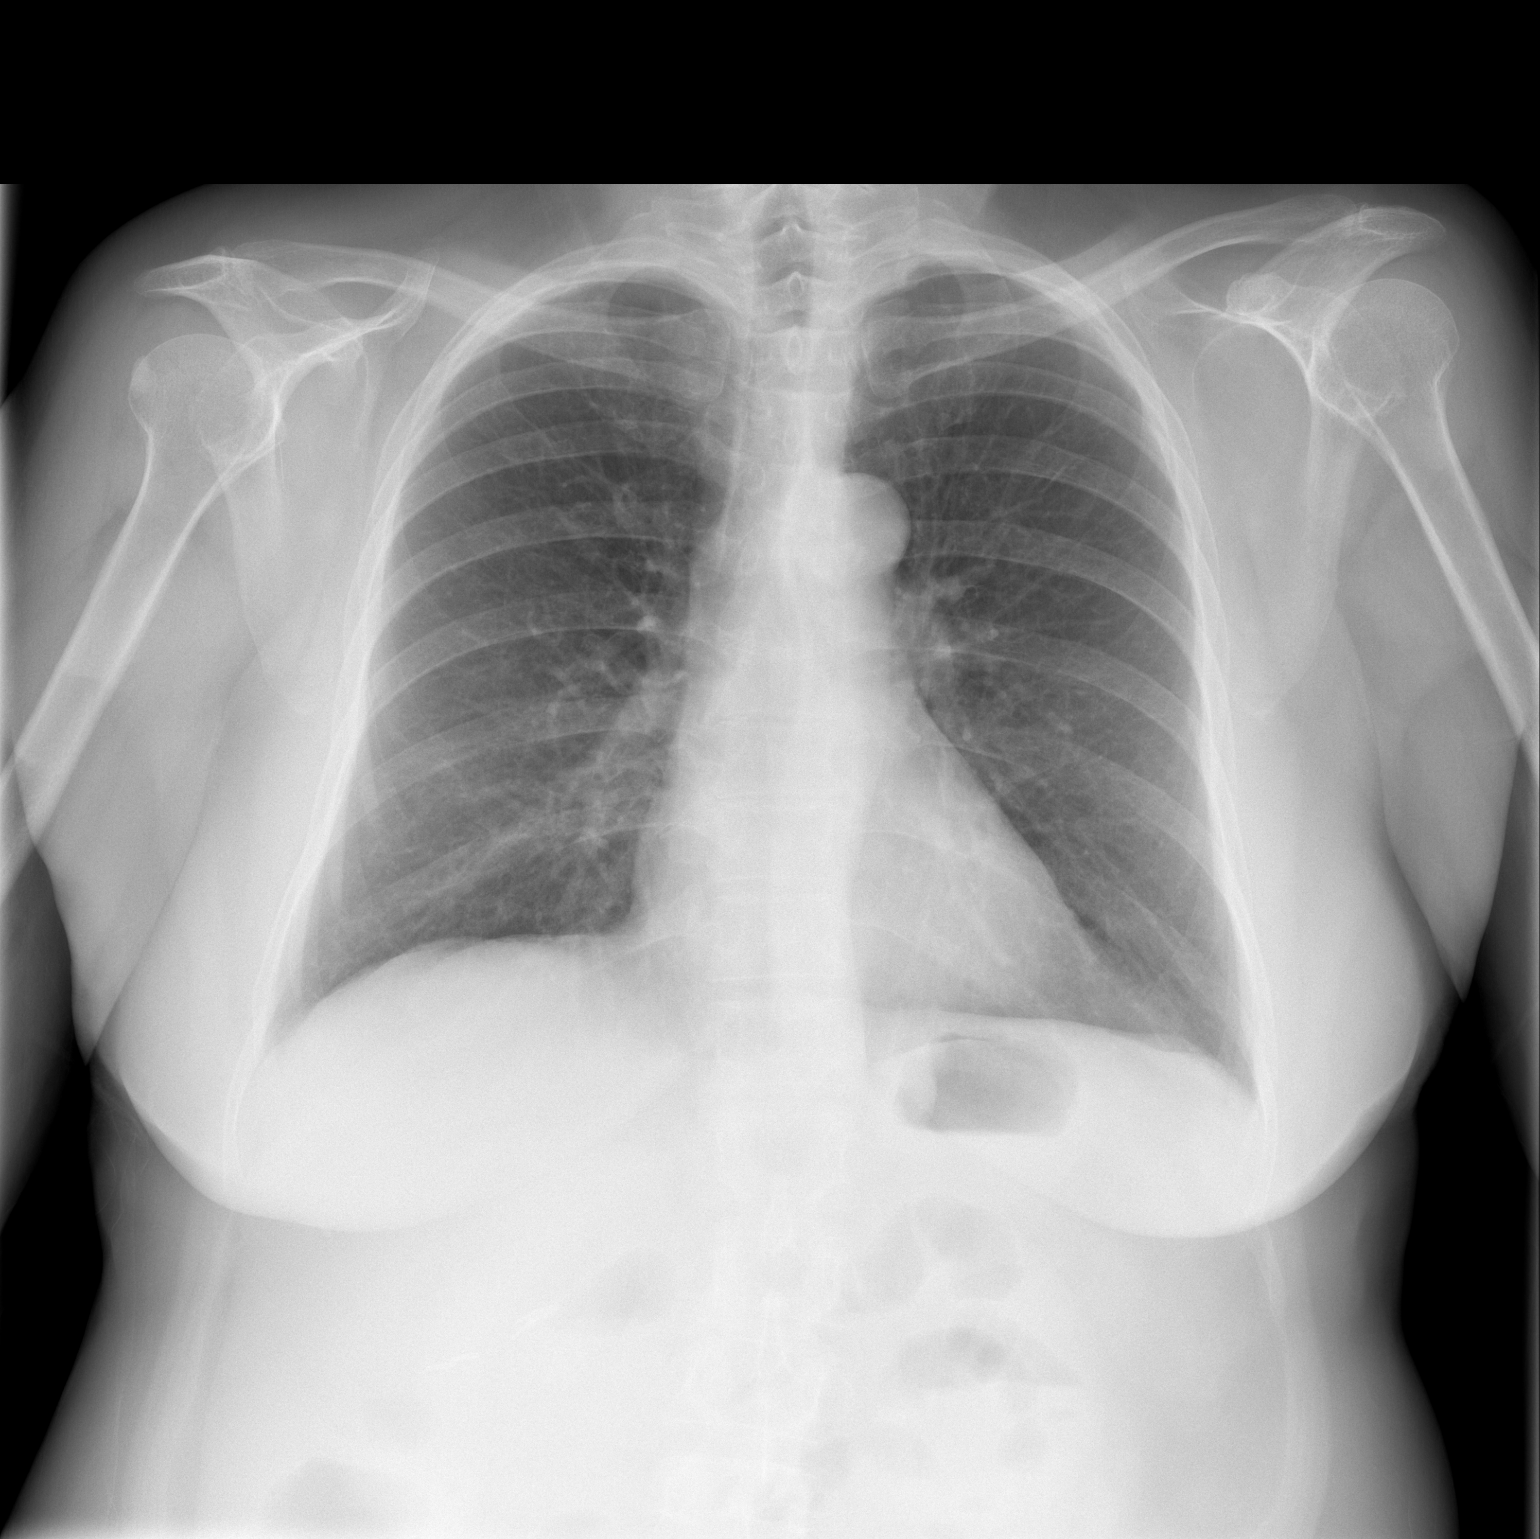

[w chest lat]
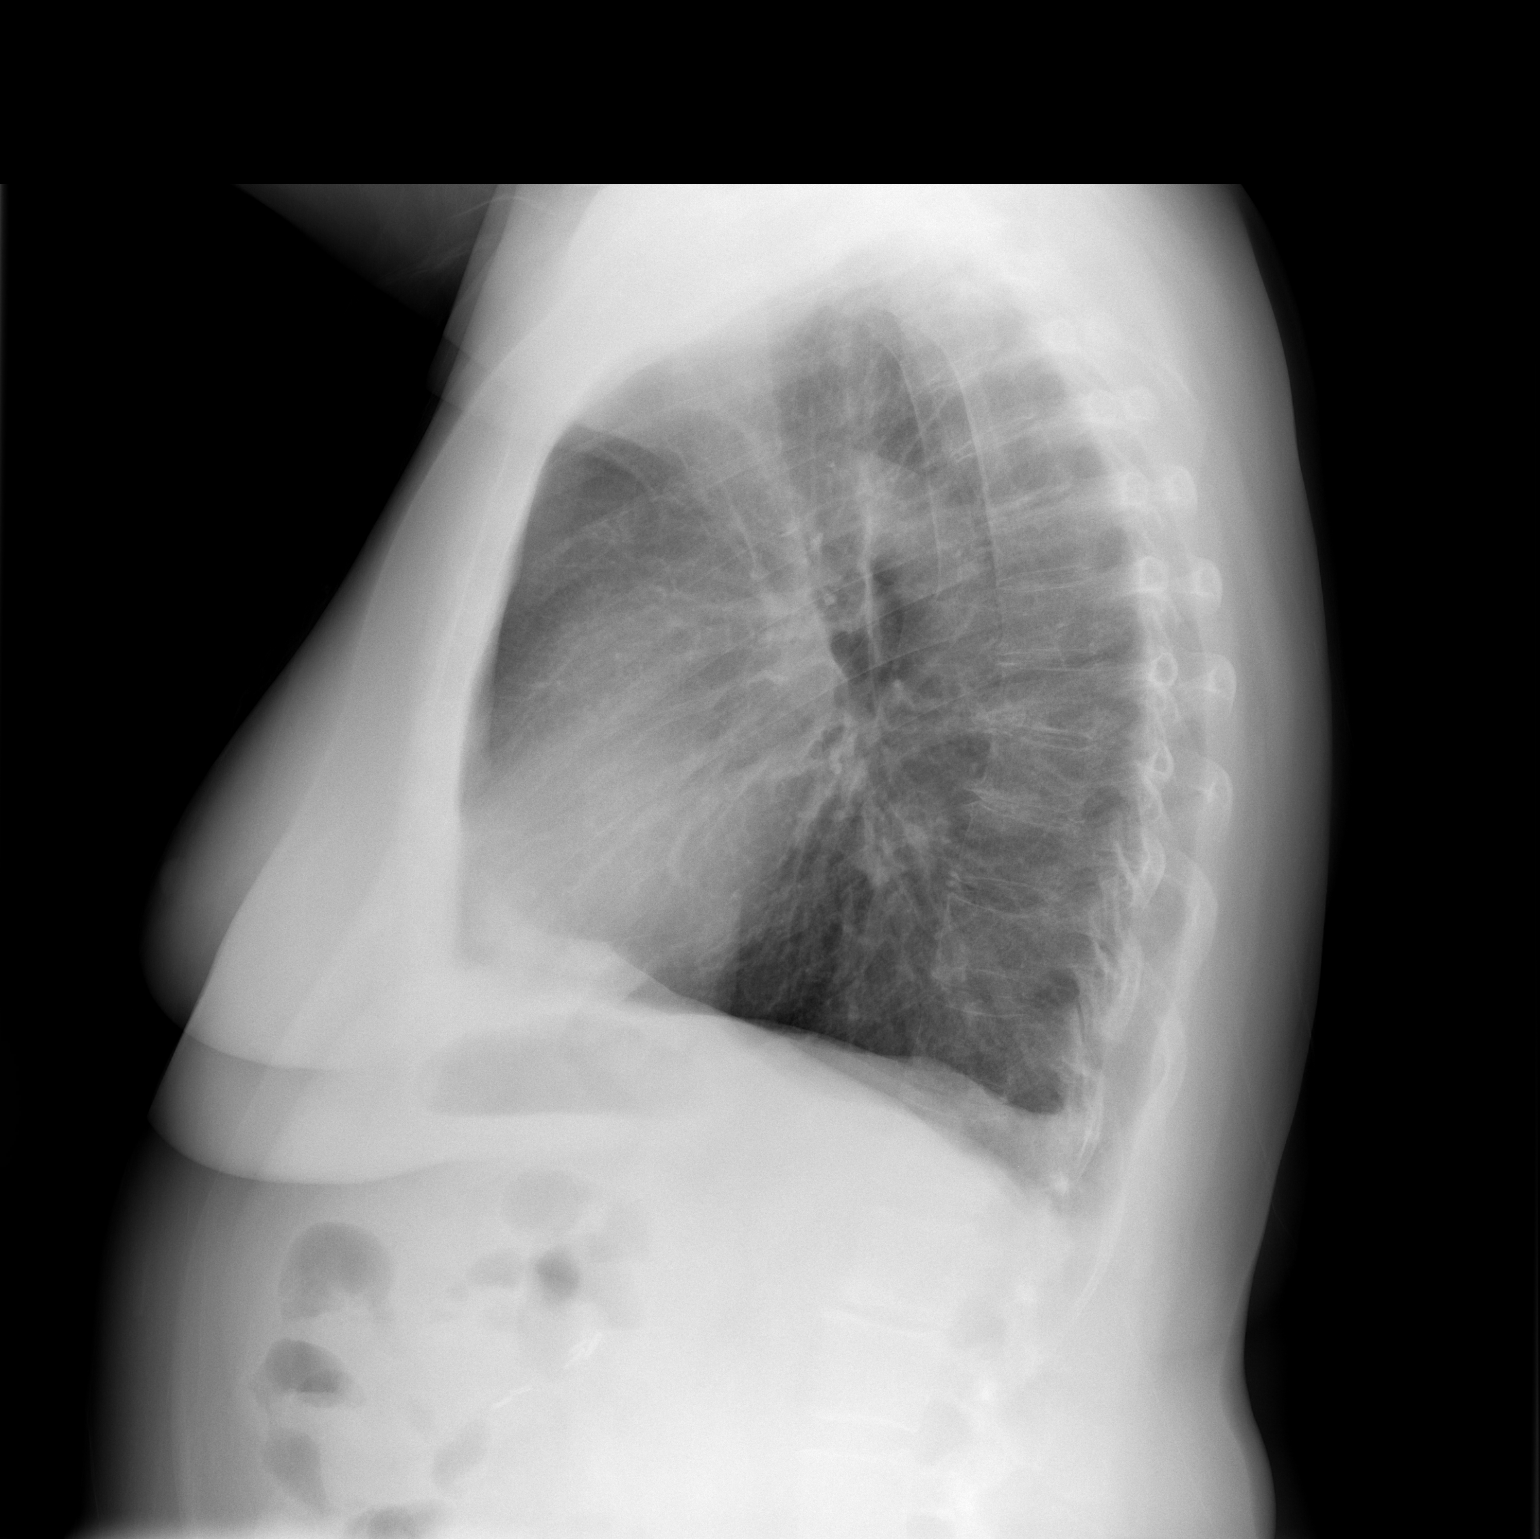

[2 of 2 positions shown; findings below may reference images not displayed]

FINDINGS: Heart size and vascularity normal. Lungs are clear without
infiltrate or mass. Small pleural effusion on the lateral view
appears to be on the left side.
IMPRESSION: Small left effusion otherwise negative

## 2019-08-22 ENCOUNTER — Encounter: Payer: Medicare Other | Admitting: Obstetrics and Gynecology

## 2019-09-02 ENCOUNTER — Other Ambulatory Visit: Payer: Self-pay

## 2019-09-02 ENCOUNTER — Encounter (HOSPITAL_COMMUNITY): Payer: Self-pay

## 2019-09-02 ENCOUNTER — Emergency Department (HOSPITAL_COMMUNITY)
Admission: EM | Admit: 2019-09-02 | Discharge: 2019-09-02 | Disposition: A | Discharge status: Home / Self Care | Payer: Medicare Other | Attending: Emergency Medicine | Admitting: Emergency Medicine

## 2019-09-02 DIAGNOSIS — M542 Cervicalgia: Secondary | ICD-10-CM | POA: Diagnosis present

## 2019-09-02 MED ORDER — KETOROLAC TROMETHAMINE 15 MG/ML IJ SOLN
15.0000 mg | Freq: Once | INTRAMUSCULAR | Status: AC
  Administered 2019-09-02: 15 mg via INTRAMUSCULAR
  Filled 2019-09-02: qty 1

## 2019-09-02 MED ORDER — TIZANIDINE HCL 4 MG PO TABS: 4.0000 mg | ORAL_TABLET | Freq: Four times a day (QID) | ORAL | 0 refills | Status: DC | PRN

## 2019-09-02 MED ORDER — DIAZEPAM 5 MG/ML IJ SOLN
2.5000 mg | Freq: Once | INTRAMUSCULAR | Status: AC
  Administered 2019-09-02: 2.5 mg via INTRAMUSCULAR
  Filled 2019-09-02: qty 2

## 2019-09-02 NOTE — Discharge Instructions (Signed)
Please return for any problem.  Use Tylenol and ibuprofen as instructed.  Use prescribed muscle relaxant as instructed.

## 2019-09-02 NOTE — ED Provider Notes (Signed)
[facility] DEPT Provider Note   CSN: [id number] Arrival date & time: 09/02/19  1137     History Chief Complaint  Patient presents with  . Headache  . Neck Pain    Pam [name] is a 74 y.o. female.  74 year old female with prior medical history as detailed below presents for evaluation of right neck pain.  Patient reports that she has been visiting family out of town for the last several days.  She slept on the couch during this visit.  This morning she woke up with sharp right-sided neck pain.  Patient's pain is worse with movement of the head to the right.  She took some Tylenol at home without improvement in her symptoms.  She denies specific head or neck injury.  She denies fever.  She denies visual change, focal weakness, chest pain, shortness of breath, or other specific acute complaint.  The history is provided by the patient.  Neck Pain Pain location:  R side Quality:  Shooting Pain radiates to:  Does not radiate Pain severity:  Mild Pain is:  Unable to specify Onset quality:  Sudden Duration:  6 hours Timing:  Intermittent Progression:  Waxing and waning Relieved by:  Nothing Worsened by:  Position Ineffective treatments:  Analgesics      Past Medical History:  Diagnosis Date  . Anxiety   . Arthritis   . Costochondritis   . GERD (gastroesophageal reflux disease)    Had surgery for  . Osteopenia 08/2018   T score -1.9 FRAX 6.2% / 1.1% overall stable from prior DEXA  . PONV (postoperative nausea and vomiting)     Patient Active Problem List   Diagnosis Date Noted  . OA (osteoarthritis) of knee 11/14/2016  . Greater trochanteric bursitis of right hip 10/19/2016  . Osteopenia 07/06/2012  . Acid reflux     Past Surgical History:  Procedure Laterality Date  . BREAST BIOPSY Left 2018   benign  . CHOLECYSTECTOMY    . ESOPHAGUS SURGERY  2008  . EXCISION/RELEASE BURSA HIP Right 10/19/2016   Procedure: Right hip bursectomy  excision portion ileotibial band;  Surgeon: Latanya Maudlin, MD;  Location: [facility];  Service: Orthopedics;  Laterality: Right;  . KNEE SURGERY     Arthroscopic  right  . TOTAL KNEE ARTHROPLASTY Right 11/14/2016   Procedure: RIGHT TOTAL KNEE ARTHROPLASTY;  Surgeon: Gaynelle Arabian, MD;  Location: [facility];  Service: Orthopedics;  Laterality: Right;  . TUBAL LIGATION       OB History    Gravida  3   Para  3   Term  3   Preterm      AB      Living  3     SAB      TAB      Ectopic      Multiple      Live Births              Family History  Problem Relation Age of Onset  . Heart failure Brother   . Liver disease Brother   . Glaucoma Sister     Social History   Tobacco Use  . Smoking status: Never Smoker  . Smokeless tobacco: Never Used  Vaping Use  . Vaping Use: Never used  Substance Use Topics  . Alcohol use: Yes    Alcohol/week: 3.0 standard drinks    Types: 3 Standard drinks or equivalent per week    Comment: OCCASIONAL  . Drug use: No  Home Medications Prior to Admission medications   Medication Sig Start Date End Date Taking? Authorizing Provider  ALPRAZolam (XANAX) 0.25 MG tablet TAKE 1 TABLET BY MOUTH AT BEDTIME AS NEEDED Patient taking differently: TAKE 1 TABLET BY MOUTH AT BEDTIME AS NEEDED FOR SLEEP 09/08/16   Huel Cote, NP  atorvastatin (LIPITOR) 20 MG tablet Take 20 mg by mouth daily.    [provider]  Cetirizine HCl (ZYRTEC PO) Take 1 tablet by mouth daily as needed (allergies).     [provider]  FLUoxetine (PROZAC) 20 MG tablet Take 40 mg by mouth daily.     [provider]  Magnesium Oxide 250 MG TABS Take 250 mg by mouth every other day.     [provider]    Allergies    Hydrocodone, Codeine, and Macrodantin  Review of Systems   Review of Systems  Musculoskeletal: Positive for neck pain.  All other systems reviewed and are negative.   Physical Exam Updated Vital Signs BP 120/78 (BP  Location: Right Arm)   Pulse 76   Temp 98.1 F (36.7 C) (Oral)   Resp 16   Ht 5\' 1"  (1.549 m)   Wt 63.5 kg   SpO2 98%   BMI 26.45 kg/m   Physical Exam Vitals and nursing note reviewed.  Constitutional:      General: She is not in acute distress.    Appearance: She is well-developed.  HENT:     Head: Normocephalic and atraumatic.  Eyes:     General: No visual field deficit.    Conjunctiva/sclera: Conjunctivae normal.     Pupils: Pupils are equal, round, and reactive to light.  Cardiovascular:     Rate and Rhythm: Normal rate and regular rhythm.     Heart sounds: Normal heart sounds.  Pulmonary:     Effort: Pulmonary effort is normal. No respiratory distress.     Breath sounds: Normal breath sounds.  Abdominal:     General: There is no distension.     Palpations: Abdomen is soft.     Tenderness: There is no abdominal tenderness.  Musculoskeletal:        General: No deformity. Normal range of motion.     Cervical back: Normal range of motion and neck supple.  Skin:    General: Skin is warm and dry.  Neurological:     Mental Status: She is alert and oriented to person, place, and time. Mental status is at baseline.     GCS: GCS eye subscore is 4. GCS verbal subscore is 5. GCS motor subscore is 6.     Cranial Nerves: No cranial nerve deficit, dysarthria or facial asymmetry.     Sensory: No sensory deficit.     Motor: No weakness.     Coordination: Coordination normal.     Gait: Gait normal.     ED Results / Procedures / Treatments   Labs (all labs ordered are listed, but only abnormal results are displayed) Labs Reviewed - No data to display  EKG None  Radiology No results found.  Procedures Procedures (including critical care time)  Medications Ordered in ED Medications  ketorolac (TORADOL) 15 MG/ML injection 15 mg (has no administration in time range)  diazepam (VALIUM) injection 2.5 mg (has no administration in time range)    ED Course  I have  reviewed the triage vital signs and the nursing notes.  Pertinent labs & imaging results that were available during my care of the patient were  reviewed by me and considered in my medical decision making (see chart for details).    MDM Rules/Calculators/A&P                          MDM  Screen complete  KRISTAIN FILO was evaluated in Emergency Department on 09/02/2019 for the symptoms described in the history of present illness. She was evaluated in the context of the global COVID-19 pandemic, which necessitated consideration that the patient might be at risk for infection with the SARS-CoV-2 virus that causes COVID-19. Institutional protocols and algorithms that pertain to the evaluation of patients at risk for COVID-19 are in a state of rapid change based on information released by regulatory bodies including the CDC and federal and state organizations. These policies and algorithms were followed during the patient's care in the ED.  Patient is presenting for evaluation of neck pain.  Patient's describe symptoms are most consistent with likely muscular torticollis.  Patient does feel improved following administration of anti-inflammatories and antispasmodic medications in the ED.  Importance of close follow-up is stressed.  Strict return precautions given and understood.  Final Clinical Impression(s) / ED Diagnoses Final diagnoses:  Neck pain    Rx / DC Orders ED Discharge Orders         Ordered    tiZANidine (ZANAFLEX) 4 MG tablet  Every 6 hours PRN     Discontinue  Reprint     09/02/19 1327           Valarie Merino, MD 09/02/19 1327

## 2019-09-02 NOTE — ED Triage Notes (Signed)
Per EMS- patient c/o headache, right neck, and right shoulder pain. Patient states she woke this AM and was not able to turn her head to the right side.

## 2019-09-23 ENCOUNTER — Encounter: Payer: Medicare Other | Admitting: Obstetrics and Gynecology

## 2019-10-09 ENCOUNTER — Encounter: Payer: Self-pay | Admitting: *Deleted

## 2019-10-11 ENCOUNTER — Encounter: Payer: Self-pay | Admitting: Diagnostic Neuroimaging

## 2019-10-11 ENCOUNTER — Ambulatory Visit: Payer: Medicare Other | Admitting: Diagnostic Neuroimaging

## 2019-10-11 VITALS — BP 135/83 | HR 90 | Ht 59.0 in | Wt 153.0 lb

## 2019-10-11 DIAGNOSIS — M5442 Lumbago with sciatica, left side: Secondary | ICD-10-CM | POA: Diagnosis not present

## 2019-10-11 DIAGNOSIS — G8929 Other chronic pain: Secondary | ICD-10-CM

## 2019-10-11 NOTE — Patient Instructions (Signed)
  BACK PAIN (radiating to left leg; suspect pinched nerve in the back) - continue PT exercises at home - continue conservative mgmt - if symptoms significantly worsen then consider MRI

## 2019-10-11 NOTE — Progress Notes (Signed)
[facility]  PATIENT: Pam [name] DOB: 16-Sep-1945  REFERRING CLINICIAN: Lawerance Cruel, MD HISTORY FROM: patient  REASON FOR VISIT: new consult    HISTORICAL  CHIEF COMPLAINT:  Chief Complaint  Patient presents with  . New Patient (Initial Visit)    Rm 6  . Sciatica    HISTORY OF PRESENT ILLNESS:   74 year old female here for evaluation of low back pain.  Symptoms started around 5 years ago but have worsened in the last 1 year.  Patient describes low back pain rating to the left hip, left thigh and left calf.  Symptoms worse with activity.  Symptoms were worse a few months ago but have improved recently.  She has been less active during the COVID-19 pandemic but now is trying to get back into her stretching and exercises.  Patient has history of right knee osteoarthritis, right hip bursitis, and has had surgeries for both of these issues.  She has compensated by putting more pressure on her left side.  She has been to orthopedic clinic, pain management, had injections with Dr. Nelva Bush, also tried chiropractic treatments and had dry needling.  Patient thinks she had MRI of the lumbar spine several years ago but does not have those results today.    REVIEW OF SYSTEMS: Full 14 system review of systems performed and negative with exception of: As per HPI.  ALLERGIES: Allergies  Allergen Reactions  . Hydrocodone Nausea And Vomiting  . Codeine Nausea And Vomiting  . Macrodantin     CAUSES GERD    HOME MEDICATIONS: Outpatient Medications Prior to Visit  Medication Sig Dispense Refill  . ALPRAZolam (XANAX) 0.25 MG tablet TAKE 1 TABLET BY MOUTH AT BEDTIME AS NEEDED (Patient taking differently: TAKE 1 TABLET BY MOUTH AT BEDTIME AS NEEDED FOR SLEEP) 30 tablet 1  . atorvastatin (LIPITOR) 20 MG tablet Take 20 mg by mouth daily.    . Cetirizine HCl (ZYRTEC PO) Take 1 tablet by mouth daily as needed (allergies).     Marland Kitchen FLUoxetine (PROZAC) 20 MG tablet Take 40  mg by mouth daily.     . Magnesium Oxide 250 MG TABS Take 250 mg by mouth every other day.     Marland Kitchen tiZANidine (ZANAFLEX) 4 MG tablet Take 1 tablet (4 mg total) by mouth every 6 (six) hours as needed for muscle spasms. 30 tablet 0   No facility-administered medications prior to visit.    PAST MEDICAL HISTORY: Past Medical History:  Diagnosis Date  . Arthritis   . Carotidynia   . Costochondritis   . Deafness    left ear  . Fibromyalgia   . GAD (generalized anxiety disorder)   . GERD (gastroesophageal reflux disease)    Had surgery for  . High cholesterol   . Hypothyroid   . Nocturia   . Osteoarthritis   . Osteopenia 08/2018   T score -1.9 FRAX 6.2% / 1.1% overall stable from prior DEXA  . PONV (postoperative nausea and vomiting)   . Sciatica   . Sleep disturbance   . Vitamin D deficiency     PAST SURGICAL HISTORY: Past Surgical History:  Procedure Laterality Date  . BREAST BIOPSY Left 2018   benign  . CHOLECYSTECTOMY  2005  . ESOPHAGUS SURGERY  2008  . EXCISION/RELEASE BURSA HIP Right 10/19/2016   Procedure: Right hip bursectomy excision portion ileotibial band;  Surgeon: Latanya Maudlin, MD;  Location: [facility];  Service: Orthopedics;  Laterality: Right;  . KNEE SURGERY  Arthroscopic  right  . TOTAL KNEE ARTHROPLASTY Right 11/14/2016   Procedure: RIGHT TOTAL KNEE ARTHROPLASTY;  Surgeon: Gaynelle Arabian, MD;  Location: [facility];  Service: Orthopedics;  Laterality: Right;  . TUBAL LIGATION      FAMILY HISTORY: Family History  Problem Relation Age of Onset  . Heart failure Brother   . Liver disease Brother   . Glaucoma Sister     SOCIAL HISTORY: Social History   Socioeconomic History  . Marital status: Married    Spouse name: Not on file  . Number of children: Not on file  . Years of education: Not on file  . Highest education level: Not on file  Occupational History  . Not on file  Tobacco Use  . Smoking status: Never Smoker  . Smokeless tobacco: Never Used   Vaping Use  . Vaping Use: Never used  Substance and Sexual Activity  . Alcohol use: Yes    Alcohol/week: 3.0 standard drinks    Types: 3 Standard drinks or equivalent per week    Comment: OCCASIONAL  . Drug use: No  . Sexual activity: Not Currently    Birth control/protection: Surgical, Post-menopausal    Comment: 1st intercourse 63 yo-2 partners  Other Topics Concern  . Not on file  Social History Narrative   Lives with husband   Social Determinants of Health   Financial Resource Strain:   . Difficulty of Paying Living Expenses: Not on file  Food Insecurity:   . Worried About Charity fundraiser in the Last Year: Not on file  . Ran Out of Food in the Last Year: Not on file  Transportation Needs:   . Lack of Transportation (Medical): Not on file  . Lack of Transportation (Non-Medical): Not on file  Physical Activity:   . Days of Exercise per Week: Not on file  . Minutes of Exercise per Session: Not on file  Stress:   . Feeling of Stress : Not on file  Social Connections:   . Frequency of Communication with Friends and Family: Not on file  . Frequency of Social Gatherings with Friends and Family: Not on file  . Attends Religious Services: Not on file  . Active Member of Clubs or Organizations: Not on file  . Attends Archivist Meetings: Not on file  . Marital Status: Not on file  Intimate Partner Violence:   . Fear of Current or Ex-Partner: Not on file  . Emotionally Abused: Not on file  . Physically Abused: Not on file  . Sexually Abused: Not on file     PHYSICAL EXAM  GENERAL EXAM/CONSTITUTIONAL: Vitals:  Vitals:   10/11/19 0805  BP: 135/83  Pulse: 90  Weight: 153 lb (69.4 kg)  Height: 4\' 11"  (1.499 m)     Body mass index is 30.9 kg/m. Wt Readings from Last 3 Encounters:  10/11/19 153 lb (69.4 kg)  09/02/19 140 lb (63.5 kg)  07/17/18 151 lb (68.5 kg)     Patient is in no distress; well developed, nourished and groomed; neck is  supple  CARDIOVASCULAR:  Examination of carotid arteries is normal; no carotid bruits  Regular rate and rhythm, no murmurs  Examination of peripheral vascular system by observation and palpation is normal  EYES:  Ophthalmoscopic exam of optic discs and posterior segments is normal; no papilledema or hemorrhages  No exam data present  MUSCULOSKELETAL:  Gait, strength, tone, movements noted in Neurologic exam below  NEUROLOGIC: MENTAL STATUS:  No flowsheet data found.  awake, alert, oriented to person, place and time  recent and remote memory intact  normal attention and concentration  language fluent, comprehension intact, naming intact  fund of knowledge appropriate  CRANIAL NERVE:   2nd - no papilledema on fundoscopic exam  2nd, 3rd, 4th, 6th - pupils equal and reactive to light, visual fields full to confrontation, extraocular muscles intact, no nystagmus  5th - facial sensation symmetric  7th - facial strength symmetric  8th - hearing intact  9th - palate elevates symmetrically, uvula midline  11th - shoulder shrug symmetric  12th - tongue protrusion midline  MOTOR:   normal bulk and tone, full strength in the BUE, BLE  SENSORY:   normal and symmetric to light touch, temperature, vibration  COORDINATION:   finger-nose-finger, fine finger movements normal  REFLEXES:   deep tendon reflexes --> BUE 2; right knee 0; left knee 1; ankles trace  GAIT/STATION:   narrow based gait     DIAGNOSTIC DATA (LABS, IMAGING, TESTING) - I reviewed patient records, labs, notes, testing and imaging myself where available.  Lab Results  Component Value Date   WBC 7.7 01/01/2018   HGB 13.2 01/01/2018   HCT 41.6 01/01/2018   MCV 97.2 01/01/2018   PLT 309 01/01/2018      Component Value Date/Time   NA 139 01/01/2018 2113   K 4.0 01/01/2018 2113   CL 107 01/01/2018 2113   CO2 23 01/01/2018 2113   GLUCOSE 107 (H) 01/01/2018 2113   BUN 13 01/01/2018  2113   CREATININE 0.87 01/01/2018 2113   CALCIUM 9.6 01/01/2018 2113   PROT 6.6 01/01/2018 2113   ALBUMIN 3.9 01/01/2018 2113   AST 23 01/01/2018 2113   ALT 24 01/01/2018 2113   ALKPHOS 96 01/01/2018 2113   BILITOT 0.9 01/01/2018 2113   GFRNONAA >60 01/01/2018 2113   GFRAA >60 01/01/2018 2113   Lab Results  Component Value Date   CHOL 236 (H) 07/06/2011   HDL 66 07/06/2011   LDLCALC 149 (H) 07/06/2011   TRIG 105 07/06/2011   CHOLHDL 3.6 07/06/2011   No results found for: HGBA1C No results found for: VITAMINB12 No results found for: TSH     ASSESSMENT AND PLAN  74 y.o. year old female here with:  Dx:  1. Chronic left-sided low back pain with left-sided sciatica       PLAN:  BACK PAIN (since ~2015; worse in 2021; radiating to left leg; suspect left lumbar radiculopathy) - offered additional PT evaluation, but patient has some exercises and would like to continue these at home  - offered to check MRI lumbar spine, but patient would like to monitor symptoms for now; if sxs significantly worsen then consider MRI lumbar spine  Return for pending if symptoms worsen or fail to improve, return to PCP.    Penni Bombard, MD 2/44/0102, 7:25 AM Certified in Neurology, Neurophysiology and Neuroimaging  [facility] [address], [place], [place] [address] [phone]

## 2019-10-16 ENCOUNTER — Encounter: Payer: Medicare Other | Admitting: Obstetrics and Gynecology

## 2020-01-13 ENCOUNTER — Ambulatory Visit: Payer: Medicare Other | Admitting: Nurse Practitioner

## 2020-01-13 DIAGNOSIS — Z0289 Encounter for other administrative examinations: Secondary | ICD-10-CM

## 2020-01-21 ENCOUNTER — Ambulatory Visit: Payer: Medicare Other | Admitting: Obstetrics and Gynecology

## 2020-02-24 DIAGNOSIS — R1013 Epigastric pain: Secondary | ICD-10-CM | POA: Diagnosis not present

## 2020-02-24 DIAGNOSIS — R12 Heartburn: Secondary | ICD-10-CM | POA: Diagnosis not present

## 2020-02-24 DIAGNOSIS — K3189 Other diseases of stomach and duodenum: Secondary | ICD-10-CM | POA: Diagnosis not present

## 2020-02-24 DIAGNOSIS — K2930 Chronic superficial gastritis without bleeding: Secondary | ICD-10-CM | POA: Diagnosis not present

## 2020-02-24 DIAGNOSIS — B3781 Candidal esophagitis: Secondary | ICD-10-CM | POA: Diagnosis not present

## 2020-02-24 DIAGNOSIS — R140 Abdominal distension (gaseous): Secondary | ICD-10-CM | POA: Diagnosis not present

## 2020-02-24 DIAGNOSIS — R6881 Early satiety: Secondary | ICD-10-CM | POA: Diagnosis not present

## 2020-02-24 DIAGNOSIS — K9189 Other postprocedural complications and disorders of digestive system: Secondary | ICD-10-CM | POA: Diagnosis not present

## 2020-02-25 ENCOUNTER — Encounter: Payer: Self-pay | Admitting: Obstetrics and Gynecology

## 2020-02-25 ENCOUNTER — Ambulatory Visit (INDEPENDENT_AMBULATORY_CARE_PROVIDER_SITE_OTHER): Payer: Medicare HMO | Admitting: Obstetrics and Gynecology

## 2020-02-25 ENCOUNTER — Other Ambulatory Visit: Payer: Self-pay

## 2020-02-25 VITALS — BP 122/80 | Ht 59.0 in | Wt 151.0 lb

## 2020-02-25 DIAGNOSIS — R300 Dysuria: Secondary | ICD-10-CM | POA: Diagnosis not present

## 2020-02-25 DIAGNOSIS — N952 Postmenopausal atrophic vaginitis: Secondary | ICD-10-CM | POA: Diagnosis not present

## 2020-02-25 DIAGNOSIS — M8588 Other specified disorders of bone density and structure, other site: Secondary | ICD-10-CM | POA: Diagnosis not present

## 2020-02-25 DIAGNOSIS — Z01419 Encounter for gynecological examination (general) (routine) without abnormal findings: Secondary | ICD-10-CM

## 2020-02-25 DIAGNOSIS — N907 Vulvar cyst: Secondary | ICD-10-CM

## 2020-02-25 DIAGNOSIS — Z23 Encounter for immunization: Secondary | ICD-10-CM | POA: Diagnosis not present

## 2020-02-25 LAB — URINALYSIS, COMPLETE W/RFL CULTURE: Specific Gravity, Urine: 1.02 (ref 1.001–1.03)

## 2020-02-25 MED ORDER — ESTROGENS, CONJUGATED 0.625 MG/GM VA CREA
1.0000 | TOPICAL_CREAM | Freq: Every day | VAGINAL | 12 refills | Status: DC
Start: 2020-02-25 — End: 2021-06-22

## 2020-02-25 NOTE — Progress Notes (Signed)
Pam [name] [birth year] [id number]  SUBJECTIVE:  75 y.o. G3P3003 female here for a breast and pelvic exam and Pap smear.  Has been noticing worsening vaginal dryness and also some urinary irritation.  Also notes a bump on the labial area.  She has no gynecologic concerns.   Current Outpatient Medications  Medication Sig Dispense Refill  . ALPRAZolam (XANAX) 0.25 MG tablet TAKE 1 TABLET BY MOUTH AT BEDTIME AS NEEDED 30 tablet 1  . atorvastatin (LIPITOR) 20 MG tablet Take 20 mg by mouth daily.    . Cetirizine HCl (ZYRTEC PO) Take 1 tablet by mouth daily as needed (allergies).     Marland Kitchen FLUoxetine (PROZAC) 20 MG tablet Take 40 mg by mouth daily.     . Magnesium Oxide 250 MG TABS Take 250 mg by mouth every other day.     Marland Kitchen tiZANidine (ZANAFLEX) 4 MG tablet Take 1 tablet (4 mg total) by mouth every 6 (six) hours as needed for muscle spasms. 30 tablet 0   No current facility-administered medications for this visit.   Allergies: Hydrocodone, Codeine, and Macrodantin  No LMP recorded. Patient is postmenopausal.  Past medical history,surgical history, problem list, medications, allergies, family history and social history were all reviewed and documented as reviewed in the EPIC chart.  GYN ROS: no abnormal bleeding, pelvic pain or discharge, no breast pain or new or enlarging lumps on self exam.  No dysuria, urinary frequency, pain with urination, cloudy/malodorous urine.   OBJECTIVE:  BP 122/80 (BP Location: Right Arm, Patient Position: Sitting, Cuff Size: Normal)   Ht 4\' 11"  (1.499 m)   Wt 151 lb (68.5 kg)   BMI 30.50 kg/m  The patient appears well, alert, oriented, in no distress.  BREAST EXAM: breasts appear normal, no suspicious masses, no skin or nipple changes or axillary nodes  PELVIC EXAM: VULVA: normal appearing vulva with atrophic change, no masses, tenderness or lesions, sebaceous cyst about 5 mm in size on mid left labia majora, VAGINA: normal appearing vagina with atrophic  change, normal color and discharge, no lesions, CERVIX: normal appearing atrophic cervix without discharge or lesions, UTERUS: uterus is normal size, shape, consistency and nontender, ADNEXA: normal adnexa in size, nontender and no masses, RECTAL: normal rectal, no masses, PAP: Pap smear done today, thin prep method  Chaperone: Wandra Scot Bonham present during the examination  ASSESSMENT:  75 y.o. [id number] here for a breast and pelvic exam  PLAN:   1. Postmenopausal.  No significant vasomotor symptoms hot flashes or night sweats.  Is having worsening vaginal dryness and some dysuria as a result.  Urinalysis is unremarkable today.  She has done well with Premarin vaginal cream in the past.  We discussed the risks of systemic absorption to include heart attack, stroke, DVT, PE and the breast and uterine cancer issues.  Should be low risk overall as she has not a smoker no prior history of these conditions with limited use as directed.  Feels the symptoms are bothering her enough that it is worth the risks and therefore I did give her a prescription for the Premarin vaginal cream, cautioned her that this may be expensive and if so do not pick up the prescription and we can try an alternative prescription instead.  She acknowledges understanding. 2. Dysuria.  As above, presumed to be due to vaginal dryness and atrophy.  UA unremarkable. 3.  Sebaceous cyst of left labia majora.  Patient is reassured by the finding that this is not a wart.  She could try sitz bath/warm compresses to see if it will help relieve the cyst, avoid shaving of the area. 4. Pap smear/HPV 2017.  No significant history of abnormal Pap smears.  We discussed current Pap smear screening guidelines and rationale for the recommendation to consider stopping screening after a certain age.  She felt more comfortable with continuing Pap smears so we did go ahead and collect that today.   5. Mammogram 08/2018.  Normal breast exam today.   Understands that she is overdue for a mammogram and will plan to get this scheduled soon. 6. Colonoscopy 2009 in chart but thinks it was more recent.  She will follow up at the interval recommended by her GI specialist at [facility], and says she will call them to check on when to do that as she just recently had an upper endoscopy. 7. DEXA 08/2018.  T score -1.9 in AP spine, FRAX 6.2% / 1.1%.  Next DEXA recommended this summer about 6 months from now at the 2 year interval so she plans to schedule this then. 8. Health maintenance.  No labs today as she normally has these completed elsewhere.  Return annually or sooner, prn.  Theresia Majors MD 02/25/20

## 2020-02-25 NOTE — Addendum Note (Signed)
Addended by: Tito Dine on: 02/25/2020 03:38 PM   Modules accepted: Orders

## 2020-02-26 LAB — PAP IG W/ RFLX HPV ASCU

## 2020-02-27 DIAGNOSIS — B3781 Candidal esophagitis: Secondary | ICD-10-CM | POA: Diagnosis not present

## 2020-02-27 DIAGNOSIS — K2930 Chronic superficial gastritis without bleeding: Secondary | ICD-10-CM | POA: Diagnosis not present

## 2020-02-27 LAB — URINALYSIS, COMPLETE W/RFL CULTURE
Bilirubin Urine: NEGATIVE
Glucose, UA: NEGATIVE
Hgb urine dipstick: NEGATIVE
Hyaline Cast: NONE SEEN /LPF
Ketones, ur: NEGATIVE
Nitrites, Initial: NEGATIVE
Protein, ur: NEGATIVE
RBC / HPF: NONE SEEN /HPF (ref 0–2)
pH: 5.5 (ref 5.0–8.0)

## 2020-02-27 LAB — URINE CULTURE
MICRO NUMBER:: 11380027
SPECIMEN QUALITY:: ADEQUATE

## 2020-02-27 LAB — CULTURE INDICATED

## 2020-03-02 ENCOUNTER — Encounter: Payer: Medicare Other | Admitting: Obstetrics and Gynecology

## 2020-03-04 ENCOUNTER — Telehealth: Payer: Self-pay | Admitting: *Deleted

## 2020-03-04 NOTE — Telephone Encounter (Signed)
Patient informed, would prefer compound estradiol cream at [facility]. Rx called in.

## 2020-03-04 NOTE — Telephone Encounter (Signed)
Patient called back stating the premarin cream prescribed at office visit on 14/22 was too expensive at $200 with insurance, patient said you told her to call if this should happened. Please advise

## 2020-03-04 NOTE — Telephone Encounter (Signed)
Patient called and left message in voicemail to call her regarding a medication. I called patient back and asked her to call me.

## 2020-03-04 NOTE — Telephone Encounter (Signed)
She can try estrace cream or custom compounded estradiol vaginal cream instead

## 2020-03-05 MED ORDER — NONFORMULARY OR COMPOUNDED ITEM: 3 refills | Status: DC

## 2020-03-11 DIAGNOSIS — M1990 Unspecified osteoarthritis, unspecified site: Secondary | ICD-10-CM | POA: Diagnosis not present

## 2020-03-11 DIAGNOSIS — M8580 Other specified disorders of bone density and structure, unspecified site: Secondary | ICD-10-CM | POA: Diagnosis not present

## 2020-03-11 DIAGNOSIS — K219 Gastro-esophageal reflux disease without esophagitis: Secondary | ICD-10-CM | POA: Diagnosis not present

## 2020-03-11 DIAGNOSIS — E7800 Pure hypercholesterolemia, unspecified: Secondary | ICD-10-CM | POA: Diagnosis not present

## 2020-03-11 DIAGNOSIS — M1711 Unilateral primary osteoarthritis, right knee: Secondary | ICD-10-CM | POA: Diagnosis not present

## 2020-03-11 DIAGNOSIS — R69 Illness, unspecified: Secondary | ICD-10-CM | POA: Diagnosis not present

## 2020-03-11 DIAGNOSIS — E039 Hypothyroidism, unspecified: Secondary | ICD-10-CM | POA: Diagnosis not present

## 2020-04-15 ENCOUNTER — Other Ambulatory Visit: Payer: Self-pay | Admitting: Obstetrics and Gynecology

## 2020-04-15 DIAGNOSIS — Z1231 Encounter for screening mammogram for malignant neoplasm of breast: Secondary | ICD-10-CM

## 2020-04-21 DIAGNOSIS — R7303 Prediabetes: Secondary | ICD-10-CM | POA: Diagnosis not present

## 2020-04-21 DIAGNOSIS — R300 Dysuria: Secondary | ICD-10-CM | POA: Diagnosis not present

## 2020-04-27 DIAGNOSIS — K219 Gastro-esophageal reflux disease without esophagitis: Secondary | ICD-10-CM | POA: Diagnosis not present

## 2020-04-27 DIAGNOSIS — Z9889 Other specified postprocedural states: Secondary | ICD-10-CM | POA: Diagnosis not present

## 2020-05-01 DIAGNOSIS — M62830 Muscle spasm of back: Secondary | ICD-10-CM | POA: Diagnosis not present

## 2020-05-01 DIAGNOSIS — M5416 Radiculopathy, lumbar region: Secondary | ICD-10-CM | POA: Diagnosis not present

## 2020-05-07 DIAGNOSIS — N3010 Interstitial cystitis (chronic) without hematuria: Secondary | ICD-10-CM | POA: Diagnosis not present

## 2020-05-07 DIAGNOSIS — R351 Nocturia: Secondary | ICD-10-CM | POA: Diagnosis not present

## 2020-05-07 DIAGNOSIS — R112 Nausea with vomiting, unspecified: Secondary | ICD-10-CM | POA: Diagnosis not present

## 2020-05-07 DIAGNOSIS — R6881 Early satiety: Secondary | ICD-10-CM | POA: Diagnosis not present

## 2020-05-07 DIAGNOSIS — Z8719 Personal history of other diseases of the digestive system: Secondary | ICD-10-CM | POA: Diagnosis not present

## 2020-05-07 DIAGNOSIS — N390 Urinary tract infection, site not specified: Secondary | ICD-10-CM | POA: Diagnosis not present

## 2020-05-07 DIAGNOSIS — Z9889 Other specified postprocedural states: Secondary | ICD-10-CM | POA: Diagnosis not present

## 2020-05-07 DIAGNOSIS — R1013 Epigastric pain: Secondary | ICD-10-CM | POA: Diagnosis not present

## 2020-05-07 DIAGNOSIS — K219 Gastro-esophageal reflux disease without esophagitis: Secondary | ICD-10-CM | POA: Diagnosis not present

## 2020-05-12 DIAGNOSIS — N3010 Interstitial cystitis (chronic) without hematuria: Secondary | ICD-10-CM | POA: Diagnosis not present

## 2020-05-14 DIAGNOSIS — Z9889 Other specified postprocedural states: Secondary | ICD-10-CM | POA: Diagnosis not present

## 2020-05-14 DIAGNOSIS — K219 Gastro-esophageal reflux disease without esophagitis: Secondary | ICD-10-CM | POA: Diagnosis not present

## 2020-05-14 DIAGNOSIS — K449 Diaphragmatic hernia without obstruction or gangrene: Secondary | ICD-10-CM | POA: Diagnosis not present

## 2020-05-22 DIAGNOSIS — N3010 Interstitial cystitis (chronic) without hematuria: Secondary | ICD-10-CM | POA: Diagnosis not present

## 2020-05-26 ENCOUNTER — Encounter (HOSPITAL_BASED_OUTPATIENT_CLINIC_OR_DEPARTMENT_OTHER): Payer: Self-pay | Admitting: Emergency Medicine

## 2020-05-26 ENCOUNTER — Emergency Department (HOSPITAL_BASED_OUTPATIENT_CLINIC_OR_DEPARTMENT_OTHER)
Admission: EM | Admit: 2020-05-26 | Discharge: 2020-05-26 | Disposition: A | Discharge status: Home / Self Care | Payer: Medicare HMO | Attending: Emergency Medicine | Admitting: Emergency Medicine

## 2020-05-26 ENCOUNTER — Other Ambulatory Visit: Payer: Self-pay

## 2020-05-26 DIAGNOSIS — Z96651 Presence of right artificial knee joint: Secondary | ICD-10-CM | POA: Insufficient documentation

## 2020-05-26 DIAGNOSIS — R21 Rash and other nonspecific skin eruption: Secondary | ICD-10-CM | POA: Diagnosis present

## 2020-05-26 DIAGNOSIS — B029 Zoster without complications: Secondary | ICD-10-CM | POA: Diagnosis not present

## 2020-05-26 DIAGNOSIS — E039 Hypothyroidism, unspecified: Secondary | ICD-10-CM | POA: Insufficient documentation

## 2020-05-26 MED ORDER — CLINDAMYCIN HCL 300 MG PO CAPS: 300.0000 mg | ORAL_CAPSULE | Freq: Two times a day (BID) | ORAL | 0 refills | Status: AC

## 2020-05-26 MED ORDER — TETRACAINE HCL 0.5 % OP SOLN
2.0000 [drp] | Freq: Once | OPHTHALMIC | Status: AC
  Administered 2020-05-26: 2 [drp] via OPHTHALMIC
  Filled 2020-05-26: qty 4

## 2020-05-26 MED ORDER — HYDROCODONE-ACETAMINOPHEN 5-325 MG PO TABS: 1.0000 | ORAL_TABLET | Freq: Four times a day (QID) | ORAL | 0 refills | Status: DC | PRN

## 2020-05-26 MED ORDER — ONDANSETRON 4 MG PO TBDP
4.0000 mg | ORAL_TABLET | Freq: Once | ORAL | Status: AC
  Administered 2020-05-26: 4 mg via ORAL
  Filled 2020-05-26: qty 1

## 2020-05-26 MED ORDER — VALACYCLOVIR HCL 1 G PO TABS: 1000.0000 mg | ORAL_TABLET | Freq: Three times a day (TID) | ORAL | 0 refills | Status: AC

## 2020-05-26 MED ORDER — FLUORESCEIN SODIUM 1 MG OP STRP
1.0000 | ORAL_STRIP | Freq: Once | OPHTHALMIC | Status: AC
  Administered 2020-05-26: 1 via OPHTHALMIC
  Filled 2020-05-26: qty 1

## 2020-05-26 MED ORDER — HYDROCODONE-ACETAMINOPHEN 5-325 MG PO TABS
1.0000 | ORAL_TABLET | Freq: Once | ORAL | Status: AC
  Administered 2020-05-26: 1 via ORAL
  Filled 2020-05-26: qty 1

## 2020-05-26 MED ORDER — ONDANSETRON 4 MG PO TBDP: 4.0000 mg | ORAL_TABLET | Freq: Three times a day (TID) | ORAL | 0 refills | Status: DC | PRN

## 2020-05-26 NOTE — Discharge Instructions (Signed)
Your exam today is likely consistent with shingles.  We have started you on antiviral medication.  Stop taking the Augmentin for dental pain we have switched up your antibiotics.  I have also given you pain medicine.  Do not drive or operate heavy machinery while taking this medication.  I have also written you for some nausea medicine which you may take when you take the pain medicine.  Follow-up with primary care provider  Return for new worsening symptoms

## 2020-05-26 NOTE — ED Notes (Signed)
Pt discharged to home. Discharge instructions have been discussed with patient and/or family members. Pt verbally acknowledges understanding d/c instructions, and endorses comprehension to checkout at registration before leaving.  °

## 2020-05-26 NOTE — ED Triage Notes (Signed)
Pt presents with c/o rash blisters for 3 days to right side of face and tooth ache on right side

## 2020-05-26 NOTE — ED Provider Notes (Signed)
[facility]PART[facility] Provider Note   CSN: [id number] Arrival date & time: 05/26/20  3474     History Chief Complaint  Patient presents with  . Rash    Rash blisters to right side of face    Pam [name] is a 75 y.o. female with past medical history significant for hypercholesterolemia, GAD evaluation of rash.  Rash started on Saturday.  Initially splotchy rash to right side of face.  Initially pruritic in nature and is now painful to light sensation.  No vision changes, eye pain, facial swelling.  She has been on Augmentin for recent dental work.  She has been compliant with this.  She is followed by dentistry.  No headache, lightheadedness, dizziness, paresthesias, weakness, facial droop, recent tick bites, new lotions, perfumes.  Has not take anything for symptoms.  No sick contacts.  Denies additional aggravating or alleviating factors.  History of obtained from patient and past medical records.  No interpreter used.  HPI     Past Medical History:  Diagnosis Date  . Arthritis   . Carotidynia   . Costochondritis   . Deafness    left ear  . Fibromyalgia   . GAD (generalized anxiety disorder)   . GERD (gastroesophageal reflux disease)    Had surgery for  . High cholesterol   . Hypothyroid   . Nocturia   . Osteoarthritis   . Osteopenia 08/2018   T score -1.9 FRAX 6.2% / 1.1% overall stable from prior DEXA  . PONV (postoperative nausea and vomiting)   . Sciatica   . Sleep disturbance   . Vitamin D deficiency     Patient Active Problem List   Diagnosis Date Noted  . OA (osteoarthritis) of knee 11/14/2016  . Greater trochanteric bursitis of right hip 10/19/2016  . Osteopenia 07/06/2012  . Acid reflux     Past Surgical History:  Procedure Laterality Date  . BREAST BIOPSY Left 2018   benign  . CHOLECYSTECTOMY  2005  . ESOPHAGUS SURGERY  2008  . EXCISION/RELEASE BURSA HIP Right 10/19/2016   Procedure: Right hip bursectomy excision portion  ileotibial band;  Surgeon: Latanya Maudlin, MD;  Location: [facility];  Service: Orthopedics;  Laterality: Right;  . KNEE SURGERY     Arthroscopic  right  . TOTAL KNEE ARTHROPLASTY Right 11/14/2016   Procedure: RIGHT TOTAL KNEE ARTHROPLASTY;  Surgeon: Gaynelle Arabian, MD;  Location: [facility];  Service: Orthopedics;  Laterality: Right;  . TUBAL LIGATION       OB History    Gravida  3   Para  3   Term  3   Preterm      AB      Living  3     SAB      IAB      Ectopic      Multiple      Live Births              Family History  Problem Relation Age of Onset  . Heart failure Brother   . Liver disease Brother   . Glaucoma Sister     Social History   Tobacco Use  . Smoking status: Never Smoker  . Smokeless tobacco: Never Used  Vaping Use  . Vaping Use: Never used  Substance Use Topics  . Alcohol use: Yes    Alcohol/week: 3.0 standard drinks    Types: 3 Standard drinks or equivalent per week    Comment: OCCASIONAL  . Drug use:  No    Home Medications Prior to Admission medications   Medication Sig Start Date End Date Taking? Authorizing Provider  clindamycin (CLEOCIN) 300 MG capsule Take 1 capsule (300 mg total) by mouth 2 (two) times daily for 5 days. 05/26/20 05/31/20 Yes Arnetta Odeh A, PA-C  HYDROcodone-acetaminophen (NORCO/VICODIN) 5-325 MG tablet Take 1 tablet by mouth every 6 (six) hours as needed. 05/26/20  Yes Keiarra Charon A, PA-C  ondansetron (ZOFRAN ODT) 4 MG disintegrating tablet Take 1 tablet (4 mg total) by mouth every 8 (eight) hours as needed for nausea or vomiting. 05/26/20  Yes Aristidis Talerico A, PA-C  valACYclovir (VALTREX) 1000 MG tablet Take 1 tablet (1,000 mg total) by mouth 3 (three) times daily for 7 days. 05/26/20 06/02/20 Yes Ameer Sanden A, PA-C  ALPRAZolam (XANAX) 0.25 MG tablet TAKE 1 TABLET BY MOUTH AT BEDTIME AS NEEDED 09/08/16   Huel Cote, NP  atorvastatin (LIPITOR) 20 MG tablet Take 20 mg by mouth daily.    [provider]  Cetirizine HCl (ZYRTEC PO) Take 1 tablet by mouth daily as needed (allergies).     [provider]  conjugated estrogens (PREMARIN) vaginal cream Place 1 Applicatorful vaginally at bedtime. Apply nightly for 1 month, then taper down to twice weekly use 02/25/20   Joseph Pierini, MD  FLUoxetine (PROZAC) 20 MG tablet Take 40 mg by mouth daily.     [provider]  Magnesium Oxide 250 MG TABS Take 250 mg by mouth every other day.     [provider]  NONFORMULARY OR COMPOUNDED ITEM Estradiol vaginal cream 0.02% insert 1 gram vaginally twice weekly. 03/05/20   Joseph Pierini, MD  tiZANidine (ZANAFLEX) 4 MG tablet Take 1 tablet (4 mg total) by mouth every 6 (six) hours as needed for muscle spasms. 09/02/19   Valarie Merino, MD    Allergies    Hydrocodone, Codeine, and Macrodantin  Review of Systems   Review of Systems  Constitutional: Negative.   HENT: Negative.   Respiratory: Negative.   Cardiovascular: Negative.   Gastrointestinal: Negative.   Genitourinary: Negative.   Musculoskeletal: Negative.   Skin: Positive for rash.  Neurological: Negative.   All other systems reviewed and are negative.   Physical Exam Updated Vital Signs BP 129/66 (BP Location: Left Arm)   Pulse 73   Temp 98 F (36.7 C) (Oral)   Resp 18   Ht 5' (1.524 m)   Wt 63.5 kg   SpO2 96%   BMI 27.34 kg/m   Physical Exam Eyes:     General: Lids are everted, no foreign bodies appreciated.     Intraocular pressure: Right eye pressure is 15 mmHg. Left eye pressure is 16 mmHg.     Extraocular Movements: Extraocular movements intact.     Conjunctiva/sclera: Conjunctivae normal.     Pupils: Pupils are equal, round, and reactive to light.     Slit lamp exam:    Right eye: Anterior chamber quiet.     Left eye: Anterior chamber quiet.     Visual Fields: Right eye visual fields normal and left eye visual fields normal.    Physical Exam  Constitutional: Pt is oriented to person,  place, and time. Pt appears well-developed and well-nourished. No distress.  HENT:  Head: Normocephalic and atraumatic.  Mouth/Throat: Oropharynx is clear and moist.  Eyes: Conjunctivae and EOM are normal. Pupils are equal, round, and reactive to light. No scleral icterus.  No dendritic lesions with staining.  IOP 15  right, 16 No horizontal, vertical or rotational nystagmus .  No pain with range of motion or eyes. Neck: Normal range of motion. Neck supple.  Full active and passive ROM without pain No midline or paraspinal tenderness No nuchal rigidity or meningeal signs  Cardiovascular: Normal rate, regular rhythm and intact distal pulses.   Pulmonary/Chest: Effort normal and breath sounds normal. No respiratory distress. Pt has no wheezes. No rales.  Abdominal: Soft. Bowel sounds are normal. There is no tenderness. There is no rebound and no guarding.  Musculoskeletal: Normal range of motion.  Lymphadenopathy:    No cervical adenopathy.  Neurological: Pt. is alert and oriented to person, place, and time. He has normal reflexes. No cranial nerve deficit.  Exhibits normal muscle tone. Coordination normal.  Mental Status:  Alert, oriented, thought content appropriate. Speech fluent without evidence of aphasia. Able to follow 2 step commands without difficulty.  Cranial Nerves:  II:  Peripheral visual fields grossly normal, pupils equal, round, reactive to light III,IV, VI: ptosis not present, extra-ocular motions intact bilaterally  V,VII: smile symmetric, facial light touch sensation equal VIII: hearing grossly normal bilaterally  IX,X: midline uvula rise  XI: bilateral shoulder shrug equal and strong XII: midline tongue extension  Motor:  5/5 in upper and lower extremities bilaterally including strong and equal grip strength and dorsiflexion/plantar flexion Sensory: Pinprick and light touch normal in all extremities.  Deep Tendon Reflexes: 2+ and symmetric  Cerebellar: normal  finger-to-nose with bilateral upper extremities Gait: normal gait and balance CV: distal pulses palpable throughout   Skin: Skin is warm and dry.  Erythematous, tender rash which is not confluent located to right side of face.  Single pustule just right of upper lip.  No rash on left side of face.  No ocular or orbital lesions. Psychiatric: Pt has a normal mood and affect. Behavior is normal. Judgment and thought content normal.  Nursing note and vitals reviewed.      ED Results / Procedures / Treatments   Labs (all labs ordered are listed, but only abnormal results are displayed) Labs Reviewed - No data to display  EKG None  Radiology No results found.  Procedures Procedures   Medications Ordered in ED Medications  tetracaine (PONTOCAINE) 0.5 % ophthalmic solution 2 drop (has no administration in time range)  fluorescein ophthalmic strip 1 strip (has no administration in time range)   ED Course  I have reviewed the triage vital signs and the nursing notes.  Pertinent labs & imaging results that were available during my care of the patient were reviewed by me and considered in my medical decision making (see chart for details).  Patient with erythematous rash to right face which began 3 days ago.  She is afebrile, nonseptic, not ill-appearing.  Initially pruritic in nature now painful.  No associated headache.  Her eyes were stained.  No dendritic lesions or fluorescein uptake.  IOP within normal limits bilateral eyes.  She has no ear lesions to suggest Ramsay Hunt syndrome.  Nonfocal neuro exam without deficits.  No facial droop.  Single vessicle to right upper lip however no intraoral lesions.  No evidence of Katherina Right, TEN.  Has been on Augmentin for dental work.  Low suspicion for acute allergic reaction.  No sensation of throat closing.  No neck stiffness or neck rigidity.  Heart lungs clear but abdomen soft, nontender.  Clinically patient likely with zoster.  No evidence  of orbital involvement currently.  Will start on antiviral medication, pain  medicine.  We will change up her antibiotics as well.  Patient denies any difficulty breathing or swallowing.  Pt has a patent airway without stridor and is handling secretions without difficulty; no angioedema. No blisters, no warmth, no draining sinus tracts, no superficial abscesses, no bullous impetigo, no desquamation, no target lesions with dusky purpura or a central bulla. No concern for SJS, TEN, TSS, tick borne illness, syphilis or other life-threatening condition.   The patient has been appropriately medically screened and/or stabilized in the ED. I have low suspicion for any other emergent medical condition which would require further screening, evaluation or treatment in the ED or require inpatient management.  Patient is hemodynamically stable and in no acute distress.  Patient able to ambulate in department prior to ED.  Evaluation does not show acute pathology that would require ongoing or additional emergent interventions while in the emergency department or further inpatient treatment.  I have discussed the diagnosis with the patient and answered all questions.  Pain is been managed while in the emergency department and patient has no further complaints prior to discharge.  Patient is comfortable with plan discussed in room and is stable for discharge at this time.  I have discussed strict return precautions for returning to the emergency department.  Patient was encouraged to follow-up with PCP/specialist refer to at discharge.  Patient seen eval by attending, Dr. Rogene Houston who agrees with the treatment, plan and disposition.    MDM Rules/Calculators/A&P                          Final Clinical Impression(s) / ED Diagnoses Final diagnoses:  Herpes zoster without complication    Rx / DC Orders ED Discharge Orders         Ordered    valACYclovir (VALTREX) 1000 MG tablet  3 times daily        05/26/20  1238    clindamycin (CLEOCIN) 300 MG capsule  2 times daily        05/26/20 1238    HYDROcodone-acetaminophen (NORCO/VICODIN) 5-325 MG tablet  Every 6 hours PRN        05/26/20 1239    ondansetron (ZOFRAN ODT) 4 MG disintegrating tablet  Every 8 hours PRN        05/26/20 1239           Pierina Schuknecht A, PA-C 05/26/20 1241    Fredia Sorrow, MD 05/27/20 0725

## 2020-05-26 NOTE — ED Provider Notes (Signed)
I provided a substantive portion of the care of this patient.  I personally performed the entirety of the history, exam and medical decision making for this encounter.    Patient seen by me along with physician assistant.  Patient with pain preceding red rash that started a few days ago.  Now has some early vesicles.  It is just on the right side of her face.  Physician assistant evaluated the eye no eye involvement.  Patient has had chickenpox in the past.   This is clinically consistent with herpes zoster.  And will treat as such.  Have her follow-up with her doctor.       Fredia Sorrow, MD 05/26/20 1226

## 2020-05-29 ENCOUNTER — Encounter (HOSPITAL_BASED_OUTPATIENT_CLINIC_OR_DEPARTMENT_OTHER): Payer: Self-pay | Admitting: *Deleted

## 2020-05-29 ENCOUNTER — Other Ambulatory Visit: Payer: Self-pay

## 2020-05-29 ENCOUNTER — Emergency Department (HOSPITAL_BASED_OUTPATIENT_CLINIC_OR_DEPARTMENT_OTHER)
Admission: EM | Admit: 2020-05-29 | Discharge: 2020-05-30 | Disposition: A | Discharge status: Home / Self Care | Payer: Medicare HMO | Attending: Emergency Medicine | Admitting: Emergency Medicine

## 2020-05-29 DIAGNOSIS — M549 Dorsalgia, unspecified: Secondary | ICD-10-CM | POA: Diagnosis not present

## 2020-05-29 DIAGNOSIS — Z96651 Presence of right artificial knee joint: Secondary | ICD-10-CM | POA: Insufficient documentation

## 2020-05-29 DIAGNOSIS — B0222 Postherpetic trigeminal neuralgia: Secondary | ICD-10-CM | POA: Diagnosis not present

## 2020-05-29 DIAGNOSIS — E039 Hypothyroidism, unspecified: Secondary | ICD-10-CM | POA: Diagnosis not present

## 2020-05-29 DIAGNOSIS — Z79899 Other long term (current) drug therapy: Secondary | ICD-10-CM | POA: Insufficient documentation

## 2020-05-29 DIAGNOSIS — R519 Headache, unspecified: Secondary | ICD-10-CM | POA: Diagnosis not present

## 2020-05-29 MED ORDER — HYDROMORPHONE HCL 1 MG/ML IJ SOLN
1.0000 mg | Freq: Once | INTRAMUSCULAR | Status: AC
  Administered 2020-05-29: 1 mg via INTRAMUSCULAR
  Filled 2020-05-29: qty 1

## 2020-05-29 MED ORDER — OXYCODONE-ACETAMINOPHEN 5-325 MG PO TABS: 1.0000 | ORAL_TABLET | Freq: Four times a day (QID) | ORAL | 0 refills | Status: DC | PRN

## 2020-05-29 MED ORDER — HYDROCODONE-ACETAMINOPHEN 5-325 MG PO TABS: 1.0000 | ORAL_TABLET | Freq: Four times a day (QID) | ORAL | 0 refills | Status: DC | PRN

## 2020-05-29 NOTE — ED Provider Notes (Signed)
[name] Provider Note   CSN: [id number] Arrival date & time: 05/29/20  2138     History Chief Complaint  Patient presents with  . Pain    Pam [name] is a 75 y.o. female.  Patient is a 75 year old female with a history of osteopenia, hypothyroidism, high cholesterol, recent diagnosis of shingles in the facial distribution 2 days ago who is presenting today with worsening pain in her face.  Patient had been taking Vicodin every 6 hours but then after speaking with her doctor they told her to increase it to every 4 hours and take ibuprofen.  She has been doing that but reports the pain is severe and excruciating.  She has no hearing changes but has noted a blister on the inside of her mouth as well.  The pain is 10 out of 10 and sharp searing even to the back of her head.  The history is provided by the patient.       Past Medical History:  Diagnosis Date  . Arthritis   . Carotidynia   . Costochondritis   . Deafness    left ear  . Fibromyalgia   . GAD (generalized anxiety disorder)   . GERD (gastroesophageal reflux disease)    Had surgery for  . High cholesterol   . Hypothyroid   . Nocturia   . Osteoarthritis   . Osteopenia 08/2018   T score -1.9 FRAX 6.2% / 1.1% overall stable from prior DEXA  . PONV (postoperative nausea and vomiting)   . Sciatica   . Sleep disturbance   . Vitamin D deficiency     Patient Active Problem List   Diagnosis Date Noted  . OA (osteoarthritis) of knee 11/14/2016  . Greater trochanteric bursitis of right hip 10/19/2016  . Osteopenia 07/06/2012  . Acid reflux     Past Surgical History:  Procedure Laterality Date  . BREAST BIOPSY Left 2018   benign  . CHOLECYSTECTOMY  2005  . ESOPHAGUS SURGERY  2008  . EXCISION/RELEASE BURSA HIP Right 10/19/2016   Procedure: Right hip bursectomy excision portion ileotibial band;  Surgeon: Latanya Maudlin, MD;  Location: [facility];  Service: Orthopedics;  Laterality:  Right;  . KNEE SURGERY     Arthroscopic  right  . TOTAL KNEE ARTHROPLASTY Right 11/14/2016   Procedure: RIGHT TOTAL KNEE ARTHROPLASTY;  Surgeon: Gaynelle Arabian, MD;  Location: [facility];  Service: Orthopedics;  Laterality: Right;  . TUBAL LIGATION       OB History    Gravida  3   Para  3   Term  3   Preterm      AB      Living  3     SAB      IAB      Ectopic      Multiple      Live Births              Family History  Problem Relation Age of Onset  . Heart failure Brother   . Liver disease Brother   . Glaucoma Sister     Social History   Tobacco Use  . Smoking status: Never Smoker  . Smokeless tobacco: Never Used  Vaping Use  . Vaping Use: Never used  Substance Use Topics  . Alcohol use: Yes    Alcohol/week: 3.0 standard drinks    Types: 3 Standard drinks or equivalent per week    Comment: OCCASIONAL  . Drug use: No  Home Medications Prior to Admission medications   Medication Sig Start Date End Date Taking? Authorizing Provider  oxyCODONE-acetaminophen (PERCOCET/ROXICET) 5-325 MG tablet Take 1 tablet by mouth every 6 (six) hours as needed for severe pain. 05/29/20  Yes Volanda Napoleon, PA-C  ALPRAZolam Duanne Moron) 0.25 MG tablet TAKE 1 TABLET BY MOUTH AT BEDTIME AS NEEDED 09/08/16   Huel Cote, NP  atorvastatin (LIPITOR) 20 MG tablet Take 20 mg by mouth daily.    [provider]  Cetirizine HCl (ZYRTEC PO) Take 1 tablet by mouth daily as needed (allergies).     [provider]  clindamycin (CLEOCIN) 300 MG capsule Take 1 capsule (300 mg total) by mouth 2 (two) times daily for 5 days. 05/26/20 05/31/20  Henderly, Britni A, PA-C  conjugated estrogens (PREMARIN) vaginal cream Place 1 Applicatorful vaginally at bedtime. Apply nightly for 1 month, then taper down to twice weekly use 02/25/20   Joseph Pierini, MD  FLUoxetine (PROZAC) 20 MG tablet Take 40 mg by mouth daily.     [provider]  HYDROcodone-acetaminophen  (NORCO/VICODIN) 5-325 MG tablet Take 1 tablet by mouth every 6 (six) hours as needed. 05/26/20   Henderly, Britni A, PA-C  Magnesium Oxide 250 MG TABS Take 250 mg by mouth every other day.     [provider]  NONFORMULARY OR COMPOUNDED ITEM Estradiol vaginal cream 0.02% insert 1 gram vaginally twice weekly. 03/05/20   Joseph Pierini, MD  ondansetron (ZOFRAN ODT) 4 MG disintegrating tablet Take 1 tablet (4 mg total) by mouth every 8 (eight) hours as needed for nausea or vomiting. 05/26/20   Henderly, Britni A, PA-C  tiZANidine (ZANAFLEX) 4 MG tablet Take 1 tablet (4 mg total) by mouth every 6 (six) hours as needed for muscle spasms. 09/02/19   Valarie Merino, MD  valACYclovir (VALTREX) 1000 MG tablet Take 1 tablet (1,000 mg total) by mouth 3 (three) times daily for 7 days. 05/26/20 06/02/20  Henderly, Britni A, PA-C    Allergies    Hydrocodone, Codeine, and Macrodantin  Review of Systems   Review of Systems  All other systems reviewed and are negative.   Physical Exam Updated Vital Signs BP 136/71   Pulse 77   Temp 98.8 F (37.1 C) (Oral)   Ht 5' (1.524 m)   Wt 63.5 kg   SpO2 99%   BMI 27.34 kg/m   Physical Exam Vitals reviewed.  Constitutional:      General: She is not in acute distress.    Appearance: Normal appearance. She is normal weight.  HENT:     Head: Normocephalic.      Right Ear: Tympanic membrane normal.  Cardiovascular:     Pulses: Normal pulses.  Pulmonary:     Effort: Pulmonary effort is normal.  Skin:    General: Skin is warm.  Neurological:     Mental Status: She is alert. Mental status is at baseline.  Psychiatric:        Mood and Affect: Mood normal.     ED Results / Procedures / Treatments   Labs (all labs ordered are listed, but only abnormal results are displayed) Labs Reviewed - No data to display  EKG None  Radiology No results found.  Procedures Procedures   Medications Ordered in ED Medications  HYDROmorphone (DILAUDID)  injection 1 mg (has no administration in time range)    ED Course  I have reviewed the triage vital signs and the nursing notes.  Pertinent labs & imaging  results that were available during my care of the patient were reviewed by me and considered in my medical decision making (see chart for details).    MDM Rules/Calculators/A&P                          Patient presents with shingles and having a trigeminal neuralgia pain.  Patient has been using pain medication at home but is still having intense pain.  She was given IM pain control.  Refilled her prescription and also instructed to start taking her gabapentin.  Close follow-up with PCP. Final Clinical Impression(s) / ED Diagnoses Final diagnoses:  Trigeminal herpes zoster    Rx / DC Orders ED Discharge Orders         Ordered    oxyCODONE-acetaminophen (PERCOCET/ROXICET) 5-325 MG tablet  Every 6 hours PRN        05/29/20 2230           Blanchie Dessert, MD 05/29/20 2250

## 2020-05-29 NOTE — ED Triage Notes (Addendum)
C/o severe shooting  pain to side of head, face, mouth and ear, DX shingles , last doe of vicodin 1 tab at 6 pm

## 2020-05-29 NOTE — Discharge Instructions (Addendum)
Continue to take ibuprofen with the pain medication.  Also try the gabapentin to help with the pain

## 2020-05-30 ENCOUNTER — Telehealth (HOSPITAL_BASED_OUTPATIENT_CLINIC_OR_DEPARTMENT_OTHER): Payer: Self-pay | Admitting: Emergency Medicine

## 2020-06-03 DIAGNOSIS — N3010 Interstitial cystitis (chronic) without hematuria: Secondary | ICD-10-CM | POA: Diagnosis not present

## 2020-06-04 ENCOUNTER — Inpatient Hospital Stay: Admission: RE | Admit: 2020-06-04 | Payer: Medicare Other | Source: Ambulatory Visit

## 2020-06-04 DIAGNOSIS — Z83511 Family history of glaucoma: Secondary | ICD-10-CM | POA: Diagnosis not present

## 2020-06-04 DIAGNOSIS — H26493 Other secondary cataract, bilateral: Secondary | ICD-10-CM | POA: Diagnosis not present

## 2020-06-04 DIAGNOSIS — H04123 Dry eye syndrome of bilateral lacrimal glands: Secondary | ICD-10-CM | POA: Diagnosis not present

## 2020-06-04 DIAGNOSIS — H11041 Peripheral pterygium, stationary, right eye: Secondary | ICD-10-CM | POA: Diagnosis not present

## 2020-06-09 DIAGNOSIS — M5416 Radiculopathy, lumbar region: Secondary | ICD-10-CM | POA: Diagnosis not present

## 2020-06-11 ENCOUNTER — Ambulatory Visit: Payer: Medicare HMO

## 2020-06-12 DIAGNOSIS — R351 Nocturia: Secondary | ICD-10-CM | POA: Diagnosis not present

## 2020-06-12 DIAGNOSIS — N3010 Interstitial cystitis (chronic) without hematuria: Secondary | ICD-10-CM | POA: Diagnosis not present

## 2020-06-16 DIAGNOSIS — R69 Illness, unspecified: Secondary | ICD-10-CM | POA: Diagnosis not present

## 2020-06-16 DIAGNOSIS — K2289 Other specified disease of esophagus: Secondary | ICD-10-CM | POA: Diagnosis not present

## 2020-06-16 DIAGNOSIS — K219 Gastro-esophageal reflux disease without esophagitis: Secondary | ICD-10-CM | POA: Diagnosis not present

## 2020-06-16 DIAGNOSIS — K449 Diaphragmatic hernia without obstruction or gangrene: Secondary | ICD-10-CM | POA: Diagnosis not present

## 2020-06-16 DIAGNOSIS — Z9889 Other specified postprocedural states: Secondary | ICD-10-CM | POA: Diagnosis not present

## 2020-06-16 DIAGNOSIS — R0989 Other specified symptoms and signs involving the circulatory and respiratory systems: Secondary | ICD-10-CM | POA: Diagnosis not present

## 2020-06-19 DIAGNOSIS — K219 Gastro-esophageal reflux disease without esophagitis: Secondary | ICD-10-CM | POA: Diagnosis not present

## 2020-06-19 DIAGNOSIS — M1990 Unspecified osteoarthritis, unspecified site: Secondary | ICD-10-CM | POA: Diagnosis not present

## 2020-06-19 DIAGNOSIS — M1711 Unilateral primary osteoarthritis, right knee: Secondary | ICD-10-CM | POA: Diagnosis not present

## 2020-06-19 DIAGNOSIS — M8580 Other specified disorders of bone density and structure, unspecified site: Secondary | ICD-10-CM | POA: Diagnosis not present

## 2020-06-19 DIAGNOSIS — E7800 Pure hypercholesterolemia, unspecified: Secondary | ICD-10-CM | POA: Diagnosis not present

## 2020-06-19 DIAGNOSIS — E039 Hypothyroidism, unspecified: Secondary | ICD-10-CM | POA: Diagnosis not present

## 2020-06-19 DIAGNOSIS — R69 Illness, unspecified: Secondary | ICD-10-CM | POA: Diagnosis not present

## 2020-06-24 DIAGNOSIS — R0989 Other specified symptoms and signs involving the circulatory and respiratory systems: Secondary | ICD-10-CM | POA: Diagnosis not present

## 2020-06-24 DIAGNOSIS — K219 Gastro-esophageal reflux disease without esophagitis: Secondary | ICD-10-CM | POA: Diagnosis not present

## 2020-07-09 ENCOUNTER — Other Ambulatory Visit: Payer: Self-pay

## 2020-07-09 ENCOUNTER — Ambulatory Visit
Admission: RE | Admit: 2020-07-09 | Discharge: 2020-07-09 | Disposition: A | Discharge status: Home / Self Care | Payer: Medicare HMO | Source: Ambulatory Visit | Attending: Obstetrics and Gynecology | Admitting: Obstetrics and Gynecology

## 2020-07-09 DIAGNOSIS — Z1231 Encounter for screening mammogram for malignant neoplasm of breast: Secondary | ICD-10-CM | POA: Diagnosis not present

## 2020-08-17 DIAGNOSIS — Z9889 Other specified postprocedural states: Secondary | ICD-10-CM | POA: Diagnosis not present

## 2020-08-25 DIAGNOSIS — K219 Gastro-esophageal reflux disease without esophagitis: Secondary | ICD-10-CM | POA: Diagnosis not present

## 2020-08-25 DIAGNOSIS — E7800 Pure hypercholesterolemia, unspecified: Secondary | ICD-10-CM | POA: Diagnosis not present

## 2020-08-25 DIAGNOSIS — R69 Illness, unspecified: Secondary | ICD-10-CM | POA: Diagnosis not present

## 2020-08-25 DIAGNOSIS — E039 Hypothyroidism, unspecified: Secondary | ICD-10-CM | POA: Diagnosis not present

## 2020-08-25 DIAGNOSIS — M1711 Unilateral primary osteoarthritis, right knee: Secondary | ICD-10-CM | POA: Diagnosis not present

## 2020-08-25 DIAGNOSIS — M1990 Unspecified osteoarthritis, unspecified site: Secondary | ICD-10-CM | POA: Diagnosis not present

## 2020-08-25 DIAGNOSIS — M8580 Other specified disorders of bone density and structure, unspecified site: Secondary | ICD-10-CM | POA: Diagnosis not present

## 2020-09-07 DIAGNOSIS — R1319 Other dysphagia: Secondary | ICD-10-CM | POA: Diagnosis not present

## 2020-09-07 DIAGNOSIS — M25561 Pain in right knee: Secondary | ICD-10-CM | POA: Diagnosis not present

## 2020-09-28 DIAGNOSIS — E7800 Pure hypercholesterolemia, unspecified: Secondary | ICD-10-CM | POA: Diagnosis not present

## 2020-09-28 DIAGNOSIS — E039 Hypothyroidism, unspecified: Secondary | ICD-10-CM | POA: Diagnosis not present

## 2020-09-28 DIAGNOSIS — Z79899 Other long term (current) drug therapy: Secondary | ICD-10-CM | POA: Diagnosis not present

## 2020-09-28 DIAGNOSIS — R079 Chest pain, unspecified: Secondary | ICD-10-CM | POA: Diagnosis not present

## 2020-09-28 DIAGNOSIS — R002 Palpitations: Secondary | ICD-10-CM | POA: Diagnosis not present

## 2020-09-28 DIAGNOSIS — E559 Vitamin D deficiency, unspecified: Secondary | ICD-10-CM | POA: Diagnosis not present

## 2020-09-28 DIAGNOSIS — R69 Illness, unspecified: Secondary | ICD-10-CM | POA: Diagnosis not present

## 2020-09-29 DIAGNOSIS — R350 Frequency of micturition: Secondary | ICD-10-CM | POA: Diagnosis not present

## 2020-09-29 DIAGNOSIS — K2289 Other specified disease of esophagus: Secondary | ICD-10-CM | POA: Diagnosis not present

## 2020-09-29 DIAGNOSIS — K2100 Gastro-esophageal reflux disease with esophagitis, without bleeding: Secondary | ICD-10-CM | POA: Diagnosis not present

## 2020-09-29 DIAGNOSIS — R1013 Epigastric pain: Secondary | ICD-10-CM | POA: Diagnosis not present

## 2020-09-29 DIAGNOSIS — N3010 Interstitial cystitis (chronic) without hematuria: Secondary | ICD-10-CM | POA: Diagnosis not present

## 2020-10-15 DIAGNOSIS — Z83511 Family history of glaucoma: Secondary | ICD-10-CM | POA: Diagnosis not present

## 2020-10-15 DIAGNOSIS — H04123 Dry eye syndrome of bilateral lacrimal glands: Secondary | ICD-10-CM | POA: Diagnosis not present

## 2020-10-15 DIAGNOSIS — H26493 Other secondary cataract, bilateral: Secondary | ICD-10-CM | POA: Diagnosis not present

## 2020-10-15 DIAGNOSIS — H43812 Vitreous degeneration, left eye: Secondary | ICD-10-CM | POA: Diagnosis not present

## 2020-11-18 DIAGNOSIS — M1990 Unspecified osteoarthritis, unspecified site: Secondary | ICD-10-CM | POA: Diagnosis not present

## 2020-11-18 DIAGNOSIS — E7800 Pure hypercholesterolemia, unspecified: Secondary | ICD-10-CM | POA: Diagnosis not present

## 2020-11-18 DIAGNOSIS — R69 Illness, unspecified: Secondary | ICD-10-CM | POA: Diagnosis not present

## 2020-11-18 DIAGNOSIS — M8580 Other specified disorders of bone density and structure, unspecified site: Secondary | ICD-10-CM | POA: Diagnosis not present

## 2020-11-18 DIAGNOSIS — K219 Gastro-esophageal reflux disease without esophagitis: Secondary | ICD-10-CM | POA: Diagnosis not present

## 2020-11-18 DIAGNOSIS — E039 Hypothyroidism, unspecified: Secondary | ICD-10-CM | POA: Diagnosis not present

## 2020-12-11 DIAGNOSIS — R1319 Other dysphagia: Secondary | ICD-10-CM | POA: Diagnosis not present

## 2020-12-11 DIAGNOSIS — K219 Gastro-esophageal reflux disease without esophagitis: Secondary | ICD-10-CM | POA: Diagnosis not present

## 2020-12-11 DIAGNOSIS — K229 Disease of esophagus, unspecified: Secondary | ICD-10-CM | POA: Diagnosis not present

## 2020-12-15 DIAGNOSIS — K219 Gastro-esophageal reflux disease without esophagitis: Secondary | ICD-10-CM | POA: Diagnosis not present

## 2020-12-21 ENCOUNTER — Telehealth: Payer: Self-pay

## 2020-12-21 NOTE — Telephone Encounter (Signed)
Note from [facility] received.  They are no longer making the Estradiol 0.02% vaginal cream as the commercial product Estradion 0.01% is now much more affordable for patients" Ms. Belford would like a prescription called in for the estradiol 0.1% to replace the 0.02%

## 2020-12-22 DIAGNOSIS — M542 Cervicalgia: Secondary | ICD-10-CM | POA: Diagnosis not present

## 2020-12-22 DIAGNOSIS — R350 Frequency of micturition: Secondary | ICD-10-CM | POA: Diagnosis not present

## 2020-12-22 DIAGNOSIS — R42 Dizziness and giddiness: Secondary | ICD-10-CM | POA: Diagnosis not present

## 2020-12-22 DIAGNOSIS — R0789 Other chest pain: Secondary | ICD-10-CM | POA: Diagnosis not present

## 2020-12-22 MED ORDER — ESTRADIOL 0.1 MG/GM VA CREA: TOPICAL_CREAM | VAGINAL | 1 refills | Status: DC

## 2020-12-22 NOTE — Telephone Encounter (Signed)
Princess Bruins, MD  You 7 minutes ago (3:01 PM)   Agree.     Rx sent.

## 2020-12-23 ENCOUNTER — Other Ambulatory Visit (HOSPITAL_COMMUNITY): Payer: Self-pay | Admitting: Family Medicine

## 2020-12-23 DIAGNOSIS — R1319 Other dysphagia: Secondary | ICD-10-CM | POA: Diagnosis not present

## 2020-12-23 DIAGNOSIS — R42 Dizziness and giddiness: Secondary | ICD-10-CM

## 2020-12-23 DIAGNOSIS — Z09 Encounter for follow-up examination after completed treatment for conditions other than malignant neoplasm: Secondary | ICD-10-CM | POA: Diagnosis not present

## 2020-12-23 DIAGNOSIS — T18128A Food in esophagus causing other injury, initial encounter: Secondary | ICD-10-CM | POA: Diagnosis not present

## 2020-12-23 DIAGNOSIS — K219 Gastro-esophageal reflux disease without esophagitis: Secondary | ICD-10-CM | POA: Diagnosis not present

## 2020-12-23 DIAGNOSIS — K449 Diaphragmatic hernia without obstruction or gangrene: Secondary | ICD-10-CM | POA: Diagnosis not present

## 2020-12-23 DIAGNOSIS — Z9889 Other specified postprocedural states: Secondary | ICD-10-CM | POA: Diagnosis not present

## 2020-12-23 DIAGNOSIS — E7800 Pure hypercholesterolemia, unspecified: Secondary | ICD-10-CM | POA: Diagnosis not present

## 2020-12-25 DIAGNOSIS — F411 Generalized anxiety disorder: Secondary | ICD-10-CM | POA: Diagnosis not present

## 2020-12-25 DIAGNOSIS — R69 Illness, unspecified: Secondary | ICD-10-CM | POA: Diagnosis not present

## 2020-12-29 ENCOUNTER — Ambulatory Visit (HOSPITAL_COMMUNITY)
Admission: RE | Admit: 2020-12-29 | Discharge: 2020-12-29 | Disposition: A | Discharge status: Home / Self Care | Payer: Medicare HMO | Source: Ambulatory Visit | Attending: Family Medicine | Admitting: Family Medicine

## 2020-12-29 DIAGNOSIS — R42 Dizziness and giddiness: Secondary | ICD-10-CM

## 2021-01-13 DIAGNOSIS — W19XXXA Unspecified fall, initial encounter: Secondary | ICD-10-CM | POA: Diagnosis not present

## 2021-01-13 DIAGNOSIS — J069 Acute upper respiratory infection, unspecified: Secondary | ICD-10-CM | POA: Diagnosis not present

## 2021-01-13 DIAGNOSIS — R051 Acute cough: Secondary | ICD-10-CM | POA: Diagnosis not present

## 2021-01-13 DIAGNOSIS — J0100 Acute maxillary sinusitis, unspecified: Secondary | ICD-10-CM | POA: Diagnosis not present

## 2021-01-13 DIAGNOSIS — S20212A Contusion of left front wall of thorax, initial encounter: Secondary | ICD-10-CM | POA: Diagnosis not present

## 2021-02-25 DIAGNOSIS — M5430 Sciatica, unspecified side: Secondary | ICD-10-CM | POA: Diagnosis not present

## 2021-02-25 DIAGNOSIS — F411 Generalized anxiety disorder: Secondary | ICD-10-CM | POA: Diagnosis not present

## 2021-02-25 DIAGNOSIS — R69 Illness, unspecified: Secondary | ICD-10-CM | POA: Diagnosis not present

## 2021-03-18 DIAGNOSIS — M5416 Radiculopathy, lumbar region: Secondary | ICD-10-CM | POA: Diagnosis not present

## 2021-04-01 DIAGNOSIS — M5416 Radiculopathy, lumbar region: Secondary | ICD-10-CM | POA: Diagnosis not present

## 2021-04-26 DIAGNOSIS — E7800 Pure hypercholesterolemia, unspecified: Secondary | ICD-10-CM | POA: Diagnosis not present

## 2021-04-26 DIAGNOSIS — R490 Dysphonia: Secondary | ICD-10-CM | POA: Diagnosis not present

## 2021-04-26 DIAGNOSIS — K449 Diaphragmatic hernia without obstruction or gangrene: Secondary | ICD-10-CM | POA: Diagnosis not present

## 2021-04-26 DIAGNOSIS — K219 Gastro-esophageal reflux disease without esophagitis: Secondary | ICD-10-CM | POA: Diagnosis not present

## 2021-04-26 DIAGNOSIS — R079 Chest pain, unspecified: Secondary | ICD-10-CM | POA: Diagnosis not present

## 2021-04-30 DIAGNOSIS — R197 Diarrhea, unspecified: Secondary | ICD-10-CM | POA: Diagnosis not present

## 2021-04-30 DIAGNOSIS — Z86010 Personal history of colonic polyps: Secondary | ICD-10-CM | POA: Diagnosis not present

## 2021-05-18 DIAGNOSIS — R197 Diarrhea, unspecified: Secondary | ICD-10-CM | POA: Diagnosis not present

## 2021-05-25 DIAGNOSIS — K219 Gastro-esophageal reflux disease without esophagitis: Secondary | ICD-10-CM | POA: Diagnosis not present

## 2021-06-07 DIAGNOSIS — K219 Gastro-esophageal reflux disease without esophagitis: Secondary | ICD-10-CM | POA: Diagnosis not present

## 2021-06-07 DIAGNOSIS — Z9889 Other specified postprocedural states: Secondary | ICD-10-CM | POA: Diagnosis not present

## 2021-06-16 DIAGNOSIS — R1013 Epigastric pain: Secondary | ICD-10-CM | POA: Diagnosis not present

## 2021-06-16 DIAGNOSIS — R5383 Other fatigue: Secondary | ICD-10-CM | POA: Diagnosis not present

## 2021-06-16 DIAGNOSIS — K2100 Gastro-esophageal reflux disease with esophagitis, without bleeding: Secondary | ICD-10-CM | POA: Diagnosis not present

## 2021-06-22 ENCOUNTER — Emergency Department (HOSPITAL_COMMUNITY): Payer: Medicare (Managed Care)

## 2021-06-22 ENCOUNTER — Emergency Department (HOSPITAL_COMMUNITY)
Admission: EM | Admit: 2021-06-22 | Discharge: 2021-06-22 | Disposition: A | Discharge status: Home / Self Care | Payer: Medicare (Managed Care) | Attending: Emergency Medicine | Admitting: Emergency Medicine

## 2021-06-22 ENCOUNTER — Encounter (HOSPITAL_COMMUNITY): Payer: Self-pay

## 2021-06-22 ENCOUNTER — Other Ambulatory Visit: Payer: Self-pay

## 2021-06-22 DIAGNOSIS — E039 Hypothyroidism, unspecified: Secondary | ICD-10-CM | POA: Insufficient documentation

## 2021-06-22 DIAGNOSIS — K760 Fatty (change of) liver, not elsewhere classified: Secondary | ICD-10-CM | POA: Diagnosis not present

## 2021-06-22 DIAGNOSIS — R110 Nausea: Secondary | ICD-10-CM | POA: Insufficient documentation

## 2021-06-22 DIAGNOSIS — R1084 Generalized abdominal pain: Secondary | ICD-10-CM

## 2021-06-22 DIAGNOSIS — K529 Noninfective gastroenteritis and colitis, unspecified: Secondary | ICD-10-CM

## 2021-06-22 DIAGNOSIS — R1030 Lower abdominal pain, unspecified: Secondary | ICD-10-CM | POA: Diagnosis not present

## 2021-06-22 DIAGNOSIS — N281 Cyst of kidney, acquired: Secondary | ICD-10-CM | POA: Diagnosis not present

## 2021-06-22 DIAGNOSIS — R109 Unspecified abdominal pain: Secondary | ICD-10-CM | POA: Diagnosis not present

## 2021-06-22 DIAGNOSIS — Z743 Need for continuous supervision: Secondary | ICD-10-CM | POA: Diagnosis not present

## 2021-06-22 DIAGNOSIS — K5900 Constipation, unspecified: Secondary | ICD-10-CM | POA: Diagnosis not present

## 2021-06-22 DIAGNOSIS — K6389 Other specified diseases of intestine: Secondary | ICD-10-CM | POA: Diagnosis not present

## 2021-06-22 LAB — COMPREHENSIVE METABOLIC PANEL
ALT: 24 U/L (ref 0–44)
AST: 34 U/L (ref 15–41)
Albumin: 3.9 g/dL (ref 3.5–5.0)
Alkaline Phosphatase: 89 U/L (ref 38–126)
Anion gap: 7 (ref 5–15)
BUN: 14 mg/dL (ref 8–23)
CO2: 26 mmol/L (ref 22–32)
Calcium: 8.5 mg/dL — ABNORMAL LOW (ref 8.9–10.3)
Chloride: 104 mmol/L (ref 98–111)
Creatinine, Ser: 0.76 mg/dL (ref 0.44–1.00)
GFR, Estimated: >60 mL/min (ref >60)
Glucose, Bld: 149 mg/dL — ABNORMAL HIGH (ref 70–99)
Potassium: 4.4 mmol/L (ref 3.5–5.1)
Sodium: 137 mmol/L (ref 135–145)
Total Bilirubin: 0.9 mg/dL (ref 0.3–1.2)
Total Protein: 6.7 g/dL (ref 6.5–8.1)

## 2021-06-22 LAB — CBC WITH DIFFERENTIAL/PLATELET
Abs Immature Granulocytes: 0.02 10*3/uL (ref 0.00–0.07)
Basophils Absolute: 0 10*3/uL (ref 0.0–0.1)
Basophils Relative: 0 %
Eosinophils Absolute: 0.2 10*3/uL (ref 0.0–0.5)
Eosinophils Relative: 2 %
HCT: 42 % (ref 36.0–46.0)
Hemoglobin: 13.7 g/dL (ref 12.0–15.0)
Immature Granulocytes: 0 %
Lymphocytes Relative: 20 %
Lymphs Abs: 1.5 10*3/uL (ref 0.7–4.0)
MCH: 32.1 pg (ref 26.0–34.0)
MCHC: 32.6 g/dL (ref 30.0–36.0)
MCV: 98.4 fL (ref 80.0–100.0)
Monocytes Absolute: 0.5 10*3/uL (ref 0.1–1.0)
Monocytes Relative: 7 %
Neutro Abs: 5.2 10*3/uL (ref 1.7–7.7)
Neutrophils Relative %: 71 %
Platelets: 216 10*3/uL (ref 150–400)
RBC: 4.27 MIL/uL (ref 3.87–5.11)
RDW: 14.7 % (ref 11.5–15.5)
WBC: 7.4 10*3/uL (ref 4.0–10.5)
nRBC: 0 % (ref 0.0–0.2)

## 2021-06-22 LAB — URINALYSIS, ROUTINE W REFLEX MICROSCOPIC
Bilirubin Urine: NEGATIVE
Glucose, UA: NEGATIVE mg/dL
Hgb urine dipstick: NEGATIVE
Ketones, ur: NEGATIVE mg/dL
Leukocytes,Ua: NEGATIVE
Nitrite: NEGATIVE
Protein, ur: NEGATIVE mg/dL
Specific Gravity, Urine: >1.046 — ABNORMAL HIGH (ref 1.005–1.030)
pH: 9 — ABNORMAL HIGH (ref 5.0–8.0)

## 2021-06-22 LAB — LIPASE, BLOOD: Lipase: 34 U/L (ref 11–51)

## 2021-06-22 MED ORDER — AMOXICILLIN-POT CLAVULANATE 875-125 MG PO TABS
1.0000 | ORAL_TABLET | Freq: Once | ORAL | Status: AC
  Administered 2021-06-22: 1 via ORAL
  Filled 2021-06-22: qty 1

## 2021-06-22 MED ORDER — FENTANYL CITRATE PF 50 MCG/ML IJ SOSY
50.0000 ug | PREFILLED_SYRINGE | Freq: Once | INTRAMUSCULAR | Status: AC
  Administered 2021-06-22: 50 ug via INTRAVENOUS
  Filled 2021-06-22: qty 1

## 2021-06-22 MED ORDER — IOHEXOL 300 MG/ML  SOLN
100.0000 mL | Freq: Once | INTRAMUSCULAR | Status: AC | PRN
  Administered 2021-06-22: 100 mL via INTRAVENOUS

## 2021-06-22 MED ORDER — SODIUM CHLORIDE (PF) 0.9 % IJ SOLN
INTRAMUSCULAR | Status: AC
  Filled 2021-06-22: qty 50

## 2021-06-22 MED ORDER — AMOXICILLIN-POT CLAVULANATE 875-125 MG PO TABS: 1.0000 | ORAL_TABLET | Freq: Two times a day (BID) | ORAL | 0 refills | Status: AC

## 2021-06-22 MED ORDER — ONDANSETRON HCL 4 MG/2ML IJ SOLN
4.0000 mg | Freq: Once | INTRAMUSCULAR | Status: AC
  Administered 2021-06-22: 4 mg via INTRAVENOUS
  Filled 2021-06-22: qty 2

## 2021-06-22 NOTE — ED Triage Notes (Signed)
Pt. BIB [facility] c/o abdominal pain that radiates to the side and back. EMS also states that pt. Has had trouble having a BM x3 days. But has frequently had problems with BMs since starting a new medication.Pt. c/o of nausea but denies vomiting.  ? ?EMS VS: ?BP:150/90 ?HR: 88 ?O2: 97% ?

## 2021-06-22 NOTE — Discharge Instructions (Addendum)
Please ensure continued hydration and follow-up with your PCP.  ?

## 2021-06-22 NOTE — ED Provider Notes (Addendum)
?  Physical Exam  ?BP (!) 153/85   Pulse 86   Temp 98.2 ?F (36.8 ?C) (Oral)   Resp 15   Ht 5' (1.524 m)   Wt 63.5 kg   SpO2 97%   BMI 27.34 kg/m?  ? ? ? ?Procedures  ?Procedures ? ?ED Course / MDM  ? ?Clinical Course as of 06/22/21 0915  ?Tue Jun 22, 2021  ?[phone] Patient with diffuse cramping abdominal pain and she appears uncomfortable.  She is now having loose liquid nonbloody stool in the ER.  Patient requires CT imaging [DW]  ?[phone] Signed out to dr Armandina Gemma at shift change [DW]  ?  ?Clinical Course User Index ?[DW] Ripley Fraise, MD  ? ?Medical Decision Making ?Amount and/or Complexity of Data Reviewed ?Labs: ordered. ?Radiology: ordered. ?ECG/medicine tests: ordered. ? ?Risk ?Prescription drug management. ? ? ?Pt with constipation, gave herself an enema, developed abdominal pain after. CT reveals colitis. Needs reassessment and PO challenge before DC. DC with ABX. ? ?On repeat evaluation, the patient had successfully passed a p.o. challenge.  She states that she feels symptomatically improved. Discussed the results of the CT imaging with the patient.  Advised continued rehydration at home.  She has Zofran for nausea at home. ? ?Laboratory results were stated significant for CBC without a leukocytosis or anemia, CMP without significant electrolyte abnormality, mild hyperglycemia to 149, normal renal and liver function.  Urinalysis without evidence of UTI.  CT abdomen pelvis results as follows: ?   ?IMPRESSION:  ?1. Colitis.  ?2. Hepatic steatosis.  ? ?Abdomen soft NT/ND. Rx for Augmentin sent. Stable for DC. ? ? ? ? ? ? ?  ?Regan Lemming, MD ?06/22/21 [phone] ? ?

## 2021-06-22 NOTE — ED Provider Notes (Signed)
?[facility] DEPT ?Provider Note ? ? ?CSN: [id number] ?Arrival date & time: 06/22/21  0435 ? ?  ? ?History ? ?Chief Complaint  ?Patient presents with  ? Abdominal Pain  ? ? ?Pam [name] is a 76 y.o. female. ? ?The history is provided by the patient.  ?Abdominal Pain ?Pain location:  Generalized ?Pain quality: cramping   ?Pain severity:  Moderate ?Onset quality:  Gradual ?Duration:  6 hours ?Timing:  Intermittent ?Progression:  Worsening ?Chronicity:  New ?Relieved by:  Nothing ?Worsened by:  Movement and palpation ?Associated symptoms: nausea   ?Associated symptoms: no dysuria, no fever, no hematochezia and no vomiting   ?Patient with history of hyperlipidemia, fibromyalgia presents with abdominal pain.  She reports she has been recently constipated over the past several days due to the medication she takes for esophageal relaxation.  She reports she had an enema performed last night at home around 11 PM and then began having diffuse abdominal pain.  She is now starting to have loose liquid nonbloody stools.  She reports continued nausea and pain ?Patient reports previous cholecystectomy and nissen fundoplication ?Past Medical History:  ?Diagnosis Date  ? Arthritis   ? Carotidynia   ? Costochondritis   ? Deafness   ? left ear  ? Fibromyalgia   ? GAD (generalized anxiety disorder)   ? GERD (gastroesophageal reflux disease)   ? Had surgery for  ? High cholesterol   ? Hypothyroid   ? Nocturia   ? Osteoarthritis   ? Osteopenia 08/2018  ? T score -1.9 FRAX 6.2% / 1.1% overall stable from prior DEXA  ? PONV (postoperative nausea and vomiting)   ? Sciatica   ? Sleep disturbance   ? Vitamin D deficiency   ? ? ?Home Medications ?Prior to Admission medications   ?Medication Sig Start Date End Date Taking? Authorizing Provider  ?ALPRAZolam (XANAX) 0.25 MG tablet TAKE 1 TABLET BY MOUTH AT BEDTIME AS NEEDED 09/08/16   Huel Cote, NP  ?atorvastatin (LIPITOR) 20 MG tablet Take 20 mg by mouth  daily.    [provider]  ?Cetirizine HCl (ZYRTEC PO) Take 1 tablet by mouth daily as needed (allergies).     [provider]  ?conjugated estrogens (PREMARIN) vaginal cream Place 1 Applicatorful vaginally at bedtime. Apply nightly for 1 month, then taper down to twice weekly use 02/25/20   Joseph Pierini, MD  ?estradiol (ESTRACE VAGINAL) 0.1 MG/GM vaginal cream Insert one gram vaginally twice weekly. 12/22/20   Princess Bruins, MD  ?FLUoxetine (PROZAC) 20 MG tablet Take 40 mg by mouth daily.     [provider]  ?Magnesium Oxide 250 MG TABS Take 250 mg by mouth every other day.     [provider]  ?ondansetron (ZOFRAN ODT) 4 MG disintegrating tablet Take 1 tablet (4 mg total) by mouth every 8 (eight) hours as needed for nausea or vomiting. 05/26/20   Henderly, Britni A, PA-C  ?   ? ?Allergies    ?Hydrocodone, Codeine, and Macrodantin   ? ?Review of Systems   ?Review of Systems  ?Constitutional:  Negative for fever.  ?Gastrointestinal:  Positive for abdominal pain and nausea. Negative for hematochezia and vomiting.  ?Genitourinary:  Negative for dysuria.  ? ?Physical Exam ?Updated Vital Signs ?BP (!) 144/82 (BP Location: Right Arm)   Pulse 90   Temp 98.2 ?F (36.8 ?C) (Oral)   Resp 20   Ht 1.524 m (5')   Wt 63.5 kg   SpO2 96%  BMI 27.34 kg/m?  ?Physical Exam ?CONSTITUTIONAL: Elderly, anxious appearing, uncomfortable ?HEAD: Normocephalic/atraumatic ?EYES: EOMI/PERRL ?ENMT: Mucous membranes dry ?NECK: supple no meningeal signs ?SPINE/BACK:entire spine nontender ?CV: S1/S2 noted, no murmurs/rubs/gallops noted ?LUNGS: Lungs are clear to auscultation bilaterally, no apparent distress ?ABDOMEN: soft, mild distention, diffuse moderate tenderness, decreased bowel sounds noted throughout ?GU:no cva tenderness ?NEURO: Pt is awake/alert/appropriate, moves all extremitiesx4.  No facial droop.  Ambulatory ?EXTREMITIES: full ROM ?SKIN: warm, color normal ?PSYCH: Anxious ? ?ED Results /  Procedures / Treatments   ?Labs ?(all labs ordered are listed, but only abnormal results are displayed) ?Labs Reviewed  ?COMPREHENSIVE METABOLIC PANEL - Abnormal; Notable for the following components:  ?    Result Value  ? Glucose, Bld 149 (*)   ? Calcium 8.5 (*)   ? All other components within normal limits  ?LIPASE, BLOOD  ?CBC WITH DIFFERENTIAL/PLATELET  ?URINALYSIS, ROUTINE W REFLEX MICROSCOPIC  ? ? ?EKG ?ED ECG REPORT ? ? Date: 06/22/2021 1751 ? Rate: 85 ? Rhythm: normal sinus rhythm ? QRS Axis: normal ? Intervals: normal ? ST/T Wave abnormalities: normal ? Conduction Disutrbances:none ?   ? ?I have personally reviewed the EKG tracing and agree with the computerized printout as noted. ? ?Radiology ?No results found. ? ?Procedures ?Procedures  ? ? ?Medications Ordered in ED ?Medications  ?fentaNYL (SUBLIMAZE) injection 50 mcg (50 mcg Intravenous Given 06/22/21 0610)  ?ondansetron [facility]) injection 4 mg (4 mg Intravenous Given 06/22/21 0610)  ? ? ?ED Course/ Medical Decision Making/ A&P ?Clinical Course as of 06/22/21 0747  ?Tue Jun 22, 2021  ?[phone] Patient with diffuse cramping abdominal pain and she appears uncomfortable.  She is now having loose liquid nonbloody stool in the ER.  Patient requires CT imaging [DW]  ?[phone] Signed out to dr Armandina Gemma at shift change [DW]  ?  ?Clinical Course User Index ?[DW] Ripley Fraise, MD  ? ?                        ?Medical Decision Making ?Amount and/or Complexity of Data Reviewed ?Labs: ordered. ?Radiology: ordered. ?ECG/medicine tests: ordered. ? ?Risk ?Prescription drug management. ? ? ?This patient presents to the ED for concern of abdominal pain, this involves an extensive number of treatment options, and is a complaint that carries with it a high risk of complications and morbidity.  The differential diagnosis includes but is not limited to bowel perforation, bowel obstruction, ileus, appendicitis, UTI, diverticulitis ? ?Comorbidities that complicate the patient  evaluation: ?Patient?s presentation is complicated by their history of hiatal hernia, previous Nissen fundoplication ? ? ? ?Additional history obtained: ?Records reviewed Care Everywhere/External Records ? ?Lab Tests: ?I Ordered, and personally interpreted labs.  The pertinent results include:  hyperglycemia ? ?Imaging Studies ordered: ?I ordered imaging studies including CT scan abdomen pelvis   ? ?Cardiac Monitoring: ?The patient was maintained on a cardiac monitor.  I personally viewed and interpreted the cardiac monitor which showed an underlying rhythm of:  sinus rhythm ? ?Medicines ordered and prescription drug management: ?I ordered medication including IV fentanyl for pain ?Reevaluation of the patient after these medicines showed that the patient    improved ? ?Reevaluation: ?After the interventions noted above, I reevaluated the patient and found that they have :improved we will proceed with CT imaging due to continued pain ? ?Complexity of problems addressed: ?Patient?s presentation is most consistent with  acute presentation with potential threat to life or bodily function ? ? ? ? ? ? ? ? ?  Final Clinical Impression(s) / ED Diagnoses ?Final diagnoses:  ?None  ? ? ?Rx / DC Orders ?ED Discharge Orders   ? ? None  ? ?  ? ? ?  ?Ripley Fraise, MD ?06/22/21 [phone] ? ?

## 2021-07-08 IMAGING — MG DIGITAL SCREENING BILATERAL MAMMOGRAM WITH TOMO AND CAD
6 of 10 series · 6 of 30 positions shown · non-contrast
Comparison: Previous exam(s).

CLINICAL DATA: Screening.

EXAM:
DIGITAL SCREENING BILATERAL MAMMOGRAM WITH TOMO AND CAD

[L MLO synth-2D]
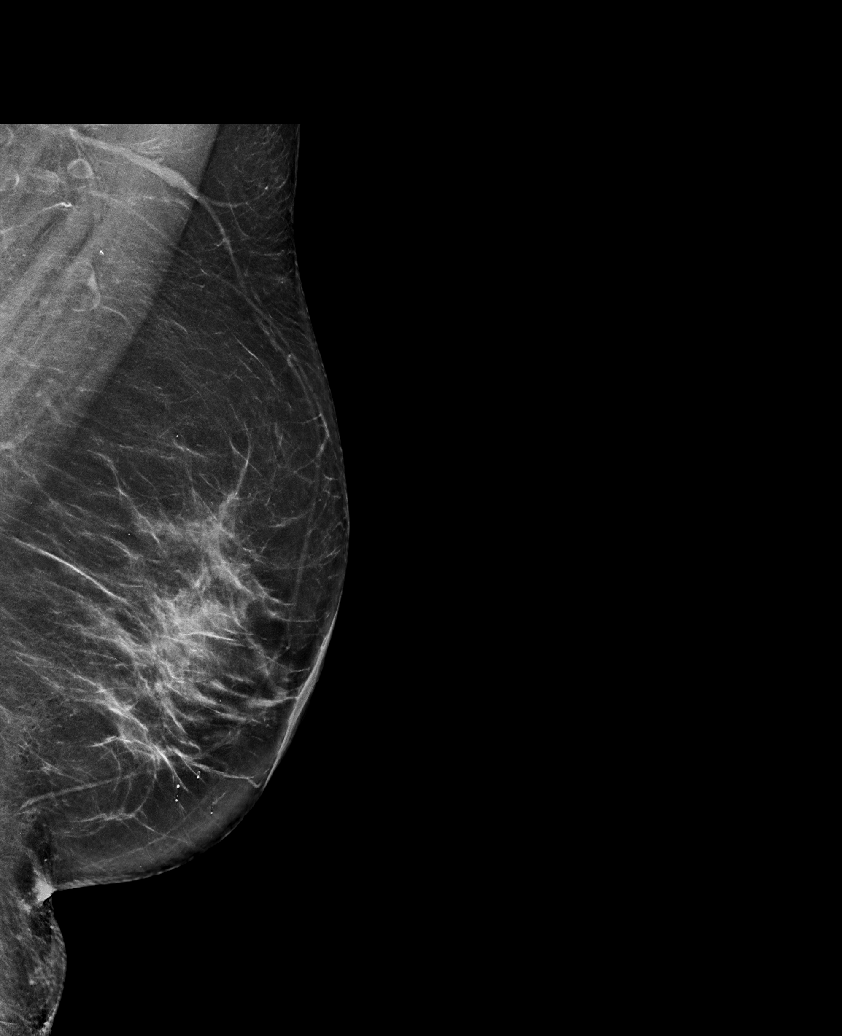

[R CC synth-2D]
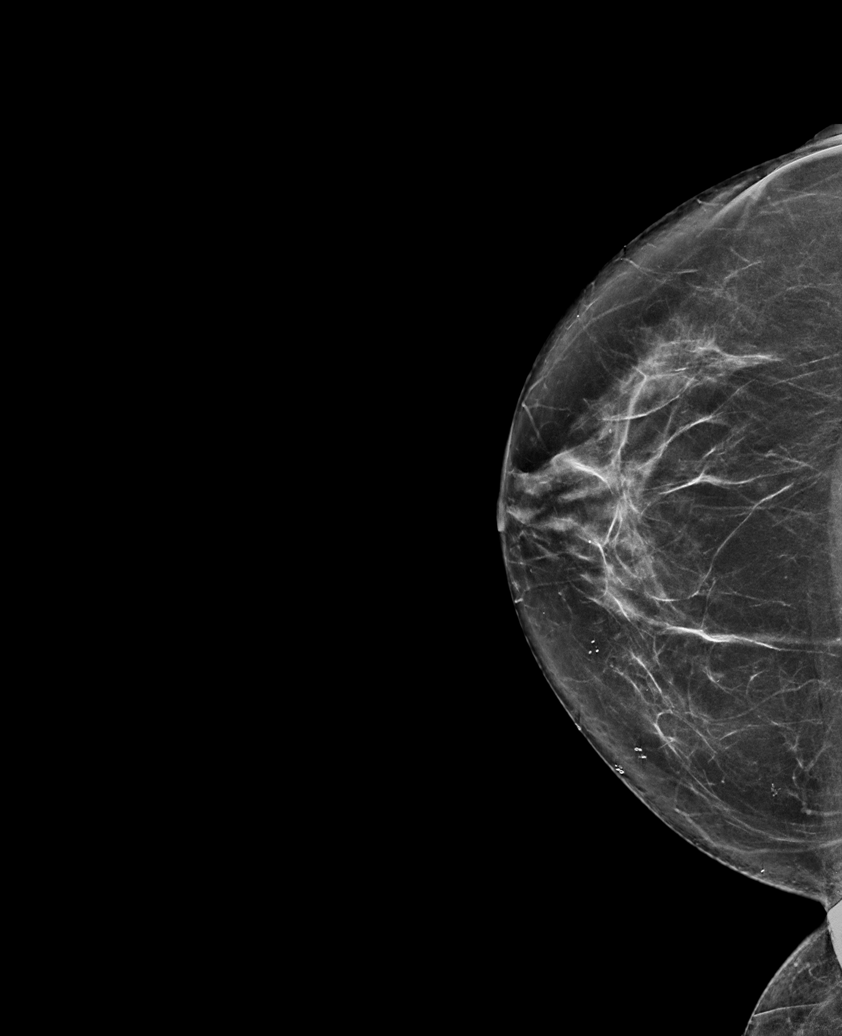

[R MLO synth-2D (1 of 2)]
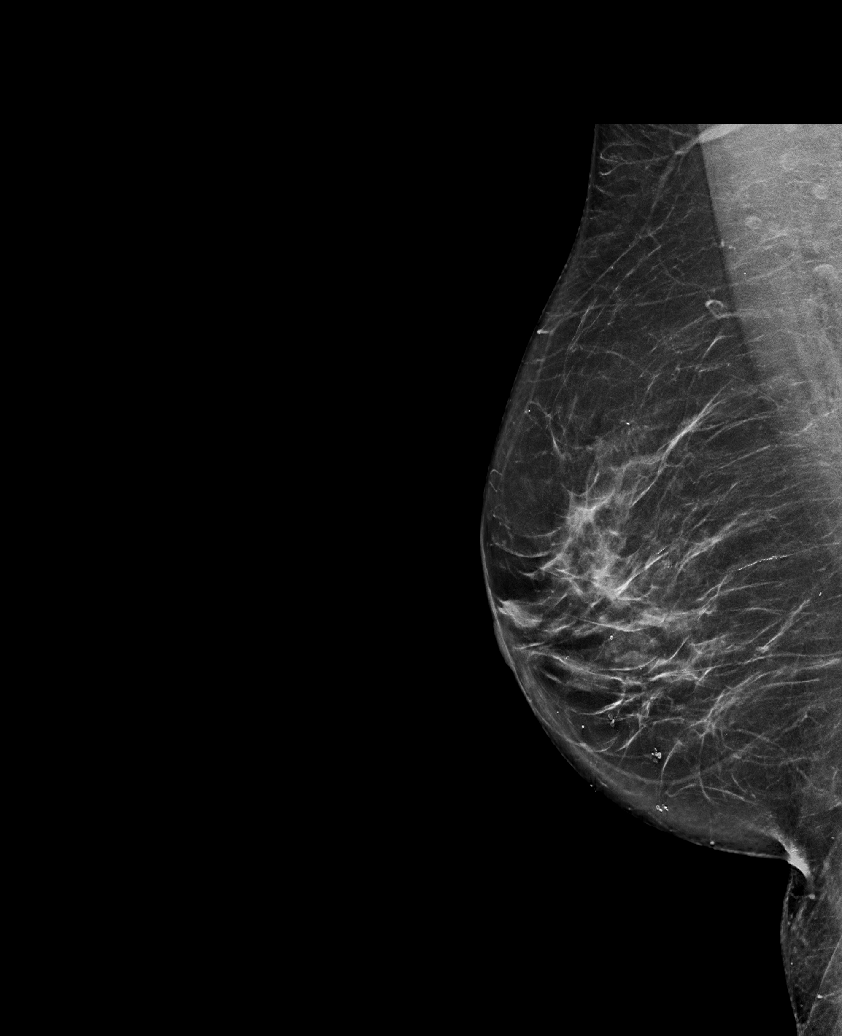

[L CC synth-2D]
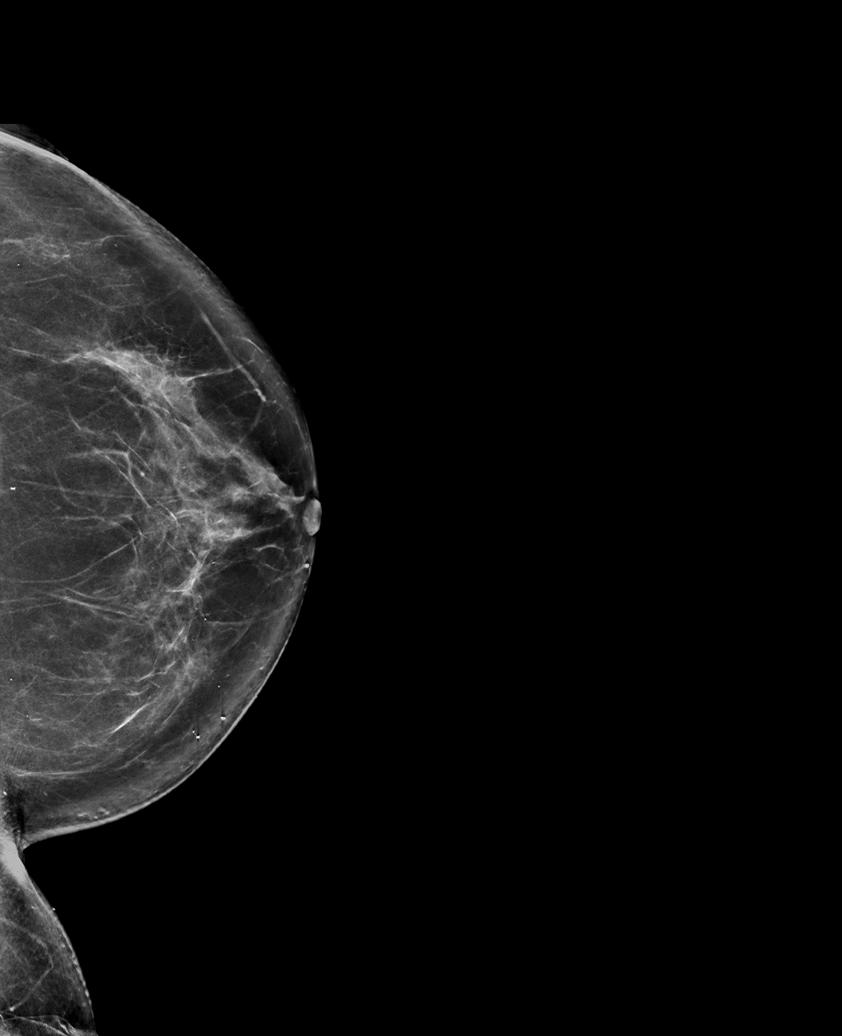

[R MLO synth-2D (2 of 2)]
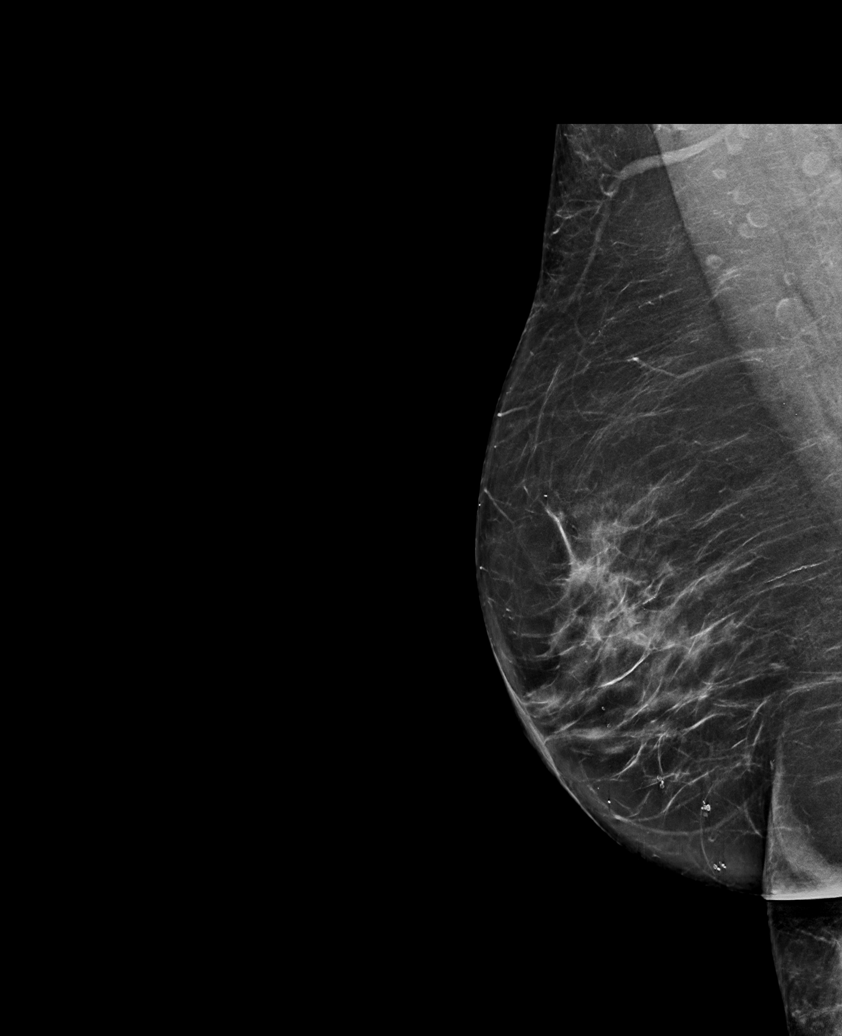

[R MLO tomo · tomo slice 39/78.0]
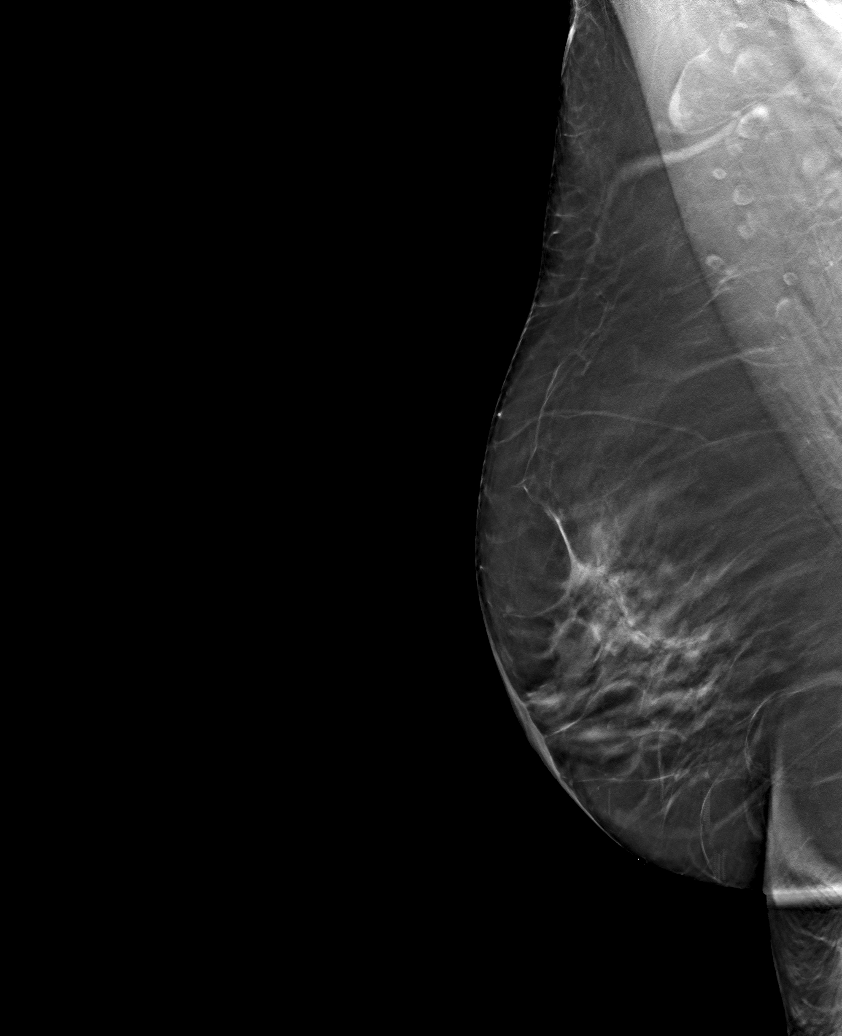

[6 of 30 positions shown; findings below may reference images not displayed]

ACR Breast Density Category b: There are scattered areas of
fibroglandular density.
FINDINGS: There are no findings suspicious for malignancy. Images were
processed with CAD.
IMPRESSION: No mammographic evidence of malignancy. A result letter of this
screening mammogram will be mailed directly to the patient.

RECOMMENDATION:
Screening mammogram in one year. (Code:[id number]-775)

BI-RADS CATEGORY  1: Negative.

## 2021-07-28 DIAGNOSIS — D120 Benign neoplasm of cecum: Secondary | ICD-10-CM | POA: Diagnosis not present

## 2021-07-28 DIAGNOSIS — D122 Benign neoplasm of ascending colon: Secondary | ICD-10-CM | POA: Diagnosis not present

## 2021-07-28 DIAGNOSIS — Z09 Encounter for follow-up examination after completed treatment for conditions other than malignant neoplasm: Secondary | ICD-10-CM | POA: Diagnosis not present

## 2021-07-28 DIAGNOSIS — D123 Benign neoplasm of transverse colon: Secondary | ICD-10-CM | POA: Diagnosis not present

## 2021-07-28 DIAGNOSIS — Z86010 Personal history of colonic polyps: Secondary | ICD-10-CM | POA: Diagnosis not present

## 2021-07-30 DIAGNOSIS — D123 Benign neoplasm of transverse colon: Secondary | ICD-10-CM | POA: Diagnosis not present

## 2021-08-10 DIAGNOSIS — R7301 Impaired fasting glucose: Secondary | ICD-10-CM | POA: Diagnosis not present

## 2021-08-10 DIAGNOSIS — R002 Palpitations: Secondary | ICD-10-CM | POA: Diagnosis not present

## 2021-08-10 DIAGNOSIS — R5383 Other fatigue: Secondary | ICD-10-CM | POA: Diagnosis not present

## 2021-08-10 DIAGNOSIS — M5430 Sciatica, unspecified side: Secondary | ICD-10-CM | POA: Diagnosis not present

## 2021-08-10 DIAGNOSIS — G4710 Hypersomnia, unspecified: Secondary | ICD-10-CM | POA: Diagnosis not present

## 2021-08-26 DIAGNOSIS — M5416 Radiculopathy, lumbar region: Secondary | ICD-10-CM | POA: Diagnosis not present

## 2021-08-30 DIAGNOSIS — Z0000 Encounter for general adult medical examination without abnormal findings: Secondary | ICD-10-CM | POA: Diagnosis not present

## 2021-09-03 DIAGNOSIS — H40013 Open angle with borderline findings, low risk, bilateral: Secondary | ICD-10-CM | POA: Diagnosis not present

## 2021-09-03 DIAGNOSIS — H5213 Myopia, bilateral: Secondary | ICD-10-CM | POA: Diagnosis not present

## 2021-09-03 DIAGNOSIS — H04123 Dry eye syndrome of bilateral lacrimal glands: Secondary | ICD-10-CM | POA: Diagnosis not present

## 2021-09-03 DIAGNOSIS — H52222 Regular astigmatism, left eye: Secondary | ICD-10-CM | POA: Diagnosis not present

## 2021-09-03 DIAGNOSIS — H43812 Vitreous degeneration, left eye: Secondary | ICD-10-CM | POA: Diagnosis not present

## 2021-09-03 DIAGNOSIS — Z961 Presence of intraocular lens: Secondary | ICD-10-CM | POA: Diagnosis not present

## 2021-09-03 DIAGNOSIS — H524 Presbyopia: Secondary | ICD-10-CM | POA: Diagnosis not present

## 2021-09-07 ENCOUNTER — Other Ambulatory Visit: Payer: Self-pay

## 2021-09-07 ENCOUNTER — Encounter (HOSPITAL_BASED_OUTPATIENT_CLINIC_OR_DEPARTMENT_OTHER): Payer: Self-pay

## 2021-09-07 ENCOUNTER — Emergency Department (HOSPITAL_BASED_OUTPATIENT_CLINIC_OR_DEPARTMENT_OTHER): Payer: Medicare (Managed Care)

## 2021-09-07 ENCOUNTER — Emergency Department (HOSPITAL_BASED_OUTPATIENT_CLINIC_OR_DEPARTMENT_OTHER)
Admission: EM | Admit: 2021-09-07 | Discharge: 2021-09-07 | Disposition: A | Discharge status: Home / Self Care | Payer: Medicare (Managed Care) | Attending: Emergency Medicine | Admitting: Emergency Medicine

## 2021-09-07 DIAGNOSIS — R1032 Left lower quadrant pain: Secondary | ICD-10-CM | POA: Diagnosis not present

## 2021-09-07 DIAGNOSIS — N281 Cyst of kidney, acquired: Secondary | ICD-10-CM | POA: Diagnosis not present

## 2021-09-07 DIAGNOSIS — K760 Fatty (change of) liver, not elsewhere classified: Secondary | ICD-10-CM | POA: Diagnosis not present

## 2021-09-07 LAB — CBC WITH DIFFERENTIAL/PLATELET
Abs Immature Granulocytes: 0.02 10*3/uL (ref 0.00–0.07)
Basophils Absolute: 0 10*3/uL (ref 0.0–0.1)
Basophils Relative: 1 %
Eosinophils Absolute: 0.2 10*3/uL (ref 0.0–0.5)
Eosinophils Relative: 3 %
HCT: 40.9 % (ref 36.0–46.0)
Hemoglobin: 13.8 g/dL (ref 12.0–15.0)
Immature Granulocytes: 0 %
Lymphocytes Relative: 42 %
Lymphs Abs: 2.3 10*3/uL (ref 0.7–4.0)
MCH: 32.1 pg (ref 26.0–34.0)
MCHC: 33.7 g/dL (ref 30.0–36.0)
MCV: 95.1 fL (ref 80.0–100.0)
Monocytes Absolute: 0.4 10*3/uL (ref 0.1–1.0)
Monocytes Relative: 7 %
Neutro Abs: 2.5 10*3/uL (ref 1.7–7.7)
Neutrophils Relative %: 47 %
Platelets: 267 10*3/uL (ref 150–400)
RBC: 4.3 MIL/uL (ref 3.87–5.11)
RDW: 14.5 % (ref 11.5–15.5)
WBC: 5.4 10*3/uL (ref 4.0–10.5)
nRBC: 0 % (ref 0.0–0.2)

## 2021-09-07 LAB — URINALYSIS, ROUTINE W REFLEX MICROSCOPIC
Bilirubin Urine: NEGATIVE
Glucose, UA: NEGATIVE mg/dL
Hgb urine dipstick: NEGATIVE
Ketones, ur: NEGATIVE mg/dL
Leukocytes,Ua: NEGATIVE
Nitrite: NEGATIVE
Protein, ur: NEGATIVE mg/dL
Specific Gravity, Urine: 1.025 (ref 1.005–1.030)
pH: 6 (ref 5.0–8.0)

## 2021-09-07 LAB — COMPREHENSIVE METABOLIC PANEL
ALT: 30 U/L (ref 0–44)
AST: 27 U/L (ref 15–41)
Albumin: 3.8 g/dL (ref 3.5–5.0)
Alkaline Phosphatase: 86 U/L (ref 38–126)
Anion gap: 7 (ref 5–15)
BUN: 17 mg/dL (ref 8–23)
CO2: 23 mmol/L (ref 22–32)
Calcium: 9.3 mg/dL (ref 8.9–10.3)
Chloride: 109 mmol/L (ref 98–111)
Creatinine, Ser: 0.69 mg/dL (ref 0.44–1.00)
GFR, Estimated: >60 mL/min (ref >60)
Glucose, Bld: 104 mg/dL — ABNORMAL HIGH (ref 70–99)
Potassium: 3.8 mmol/L (ref 3.5–5.1)
Sodium: 139 mmol/L (ref 135–145)
Total Bilirubin: 0.6 mg/dL (ref 0.3–1.2)
Total Protein: 6.9 g/dL (ref 6.5–8.1)

## 2021-09-07 MED ORDER — IOHEXOL 300 MG/ML  SOLN
100.0000 mL | Freq: Once | INTRAMUSCULAR | Status: AC | PRN
  Administered 2021-09-07: 100 mL via INTRAVENOUS

## 2021-09-07 NOTE — ED Triage Notes (Signed)
C/o left groin pain for 3 weeks. Denies other symptoms

## 2021-09-07 NOTE — ED Provider Notes (Signed)
[facility] EMERGENCY DEPARTMENT Provider Note   CSN: [id number] Arrival date & time: 09/07/21  1224     History  Chief Complaint  Patient presents with   Groin Pain    Pam [name] is a 76 y.o. female with a past medical history of osteopenia, arthritis, sciatica, fibromyalgia, costochondritis, general anxiety disorder, status post cholecystectomy status post right hip bursectomy and excision of portion of the IT band performed by Dr. Gladstone Lighter 2018, total right knee arthroplasty done by Dr. Maureen Ralphs 2018.  Patient presents to the emerged part with a chief complaint of left lower quadrant abdominal/groin pain.  Patient reports that the pain has been intermittent over the last 3 to 4 weeks.  Patient reports that she notices the pain when she has been sitting for a while moves to a standing position.  Patient states that today the pain has been constant.  Patient denies any recent falls or injuries.  Patient denies any fever, chills, nausea, vomiting, constipation, diarrhea, blood in stool, melena, dysuria, hematuria, urinary urgency, urinary frequency, vaginal pain, vaginal bleeding, vaginal discharge.   Groin Pain Associated symptoms include abdominal pain. Pertinent negatives include no chest pain, no headaches and no shortness of breath.       Home Medications Prior to Admission medications   Medication Sig Start Date End Date Taking? Authorizing Provider  acetaminophen (TYLENOL) 500 MG tablet Take 1,000 mg by mouth every 6 (six) hours as needed for mild pain.    [provider]  ALPRAZolam (XANAX) 0.25 MG tablet TAKE 1 TABLET BY MOUTH AT BEDTIME AS NEEDED Patient taking differently: Take 0.25 mg by mouth at bedtime as needed for sleep. 09/08/16   Huel Cote, NP  amitriptyline (ELAVIL) 25 MG tablet Take 25 mg by mouth at bedtime. 06/01/21   [provider]  atorvastatin (LIPITOR) 20 MG tablet Take 20 mg by mouth daily.    [provider]   Baclofen 5 MG TABS Take 5 mg by mouth 2 (two) times daily as needed for muscle spasms. 04/20/21   [provider]  Calcium Carb-Cholecalciferol 600-10 MG-MCG CAPS Take 1 tablet by mouth daily.    [provider]  cetirizine (ZYRTEC) 10 MG chewable tablet Chew 10 mg by mouth daily as needed (allergies).    [provider]  Cholecalciferol (VITAMIN D3) 50 MCG (2000 UT) capsule Take 2,000 Units by mouth daily.    [provider]  estradiol (ESTRACE VAGINAL) 0.1 MG/GM vaginal cream Insert one gram vaginally twice weekly. Patient taking differently: Place 1 Applicatorful vaginally 2 (two) times daily as needed (hormone replacement). 12/22/20   Princess Bruins, MD  FLUoxetine (PROZAC) 40 MG capsule Take 40 mg by mouth daily. 05/24/21   [provider]  hyoscyamine (LEVSIN) 0.125 MG tablet Take 0.125 mg by mouth every 6 (six) hours as needed for cramping. 04/10/21   [provider]  Magnesium Oxide 250 MG TABS Take 250 mg by mouth every other day.     [provider]  ondansetron (ZOFRAN ODT) 4 MG disintegrating tablet Take 1 tablet (4 mg total) by mouth every 8 (eight) hours as needed for nausea or vomiting. 05/26/20   Henderly, Britni A, PA-C      Allergies    Hydrocodone, Codeine, and Macrodantin    Review of Systems   Review of Systems  Constitutional:  Negative for chills and fever.  Eyes:  Negative for visual disturbance.  Respiratory:  Negative for shortness of breath.   Cardiovascular:  Negative for chest pain.  Gastrointestinal:  Positive for abdominal pain. Negative for abdominal distention, anal bleeding, blood in stool, constipation, diarrhea, nausea, rectal pain and vomiting.  Genitourinary:  Negative for difficulty urinating, dysuria, flank pain, frequency, pelvic pain, urgency, vaginal bleeding, vaginal discharge and vaginal pain.  Musculoskeletal:  Negative for back pain and neck pain.  Skin:  Negative for color change and  rash.  Neurological:  Negative for dizziness, syncope, light-headedness and headaches.  Psychiatric/Behavioral:  Negative for confusion.     Physical Exam Updated Vital Signs BP 124/84 (BP Location: Right Arm)   Pulse 82   Temp 98.2 F (36.8 C) (Oral)   Resp 18   Ht 5' (1.524 m)   Wt 67.6 kg   SpO2 99%   BMI 29.10 kg/m  Physical Exam Vitals and nursing note reviewed. Exam conducted with a chaperone present (Female nurse present as chaperone).  Constitutional:      General: She is not in acute distress.    Appearance: She is not ill-appearing, toxic-appearing or diaphoretic.  HENT:     Head: Normocephalic.  Eyes:     General: No scleral icterus.       Right eye: No discharge.        Left eye: No discharge.  Cardiovascular:     Rate and Rhythm: Normal rate.     Pulses:          Femoral pulses are 2+ on the right side and 2+ on the left side. Pulmonary:     Effort: Pulmonary effort is normal.  Abdominal:     General: Abdomen is flat. Bowel sounds are normal. There is no distension. There are no signs of injury.     Palpations: Abdomen is soft. There is no mass or pulsatile mass.     Tenderness: There is abdominal tenderness in the left lower quadrant. There is no right CVA tenderness, left CVA tenderness, guarding or rebound.     Hernia: There is no hernia in the umbilical area or ventral area.       Comments: Pinpoint tenderness as indicated above  Musculoskeletal:     Left hip: No deformity, lacerations, tenderness, bony tenderness or crepitus.     Left upper leg: No tenderness or bony tenderness.  Skin:    General: Skin is warm and dry.  Neurological:     General: No focal deficit present.     Mental Status: She is alert and oriented to person, place, and time.     GCS: GCS eye subscore is 4. GCS verbal subscore is 5. GCS motor subscore is 6.  Psychiatric:        Behavior: Behavior is cooperative.     ED Results / Procedures / Treatments   Labs (all labs  ordered are listed, but only abnormal results are displayed) Labs Reviewed - No data to display  EKG None  Radiology No results found.  Procedures Procedures    Medications Ordered in ED Medications - No data to display  ED Course/ Medical Decision Making/ A&P                           Medical Decision Making Amount and/or Complexity of Data Reviewed Labs: ordered. Radiology: ordered.  Risk Prescription drug management.   Alert 76 year old female in no acute distress, nontoxic-appearing.  Presents the emergency department the chief complaint of left lower quadrant/groin pain.  Information was obtained from patient.  I reviewed patient's past  medical records including previous prior notes, labs, and imaging.  Patient has medical history as outlined in HPI which complicates her care  Patient has tenderness to left lower quadrant.  Differential diagnosis includes but is not limited to diverticulitis, hernia, ureteral/renal calculus, pyelonephritis, musculoskeletal injury.  Will obtain CMP, CBC, UA, and CT abdomen pelvis with contrast for further evaluation.  Patient care discussed with attending physician Dr. Pearline Cables.  I personally viewed and interpreted patient's lab results.  Pertinent findings include: -CBC, CMP, UA unremarkable  I personally viewed and interpreted patient CT imaging.  Agree with radiology interpretation of no acute abnormality within abdomen or pelvis.  Patient is able to stand and ambulate without difficulty.  Etiology of patient's pain is unclear at this time.  Patient to use OTC medications as needed for pain management.  Patient to follow-up with PCP if symptoms not improved.  Based on patient's chief complaint, I considered admission might be necessary, however after reassuring ED workup feel patient is reasonable for discharge.  Discussed results, findings, treatment and follow up. Patient advised of return precautions. Patient verbalized understanding  and agreed with plan.  Portions of this note were generated with [employer]. Dictation errors may occur despite best attempts at proofreading.         Final Clinical Impression(s) / ED Diagnoses Final diagnoses:  None    Rx / DC Orders ED Discharge Orders     None         Loni Beckwith, PA-C 09/07/21 [place], [place], DO 09/08/21 [phone]

## 2021-09-07 NOTE — Discharge Instructions (Addendum)
You came to the emergency department to be evaluated for your abdominal/pelvic pain.  Your physical exam and lab results were reassuring.  The CT scan of your abdomen pelvis that show any acute abnormalities.  Please take Tylenol and ibuprofen as needed for pain.  Please follow-up with your primary care doctor if your symptoms do not improve.  Please take Ibuprofen (Advil, motrin) and Tylenol (acetaminophen) to relieve your pain.    You may take up to 600 MG (3 pills) of normal strength ibuprofen every 8 hours as needed.   You make take tylenol, up to 1,000 mg (two extra strength pills) every 8 hours as needed.   It is safe to take ibuprofen and tylenol at the same time as they work differently.   Do not take more than 3,000 mg tylenol in a 24 hour period (not more than one dose every 8 hours.  Please check all medication labels as many medications such as pain and cold medications may contain tylenol.  Do not drink alcohol while taking these medications.  Do not take other NSAID'S while taking ibuprofen (such as aleve or naproxen).  Please take ibuprofen with food to decrease stomach upset.  Get help right away if: Your pain does not go away as soon as your health care provider told you to expect. You cannot stop vomiting. Your pain is only in areas of the abdomen, such as the right side or the left lower portion of the abdomen. Pain on the right side could be caused by appendicitis. You have bloody or black stools, or stools that look like tar. You have severe pain, cramping, or bloating in your abdomen. You have signs of dehydration, such as: Dark urine, very little urine, or no urine. Cracked lips. Dry mouth. Sunken eyes. Sleepiness. Weakness. You have trouble breathing or chest pain.

## 2021-09-09 ENCOUNTER — Ambulatory Visit: Payer: Medicare (Managed Care) | Admitting: Cardiology

## 2021-10-07 NOTE — Progress Notes (Signed)
ID:  Pam [name], DOB 1945-11-09, MRN [id number]  PCP:  Pam Cruel, MD  Cardiologist:  Rex Kras, DO, [facility]  (established care 10/08/2021)  REASON FOR CONSULT: Palpitations  REQUESTING PHYSICIAN:  Pam Cruel, MD [place],  [place] [address]  Chief Complaint  Patient presents with   Palpitations   New Patient (Initial Visit)    Referred by Pam Crutch, MD    HPI  Pam [name] is a 76 y.o.  female who presents to the clinic for evaluation of palpitations at the request of Pam Cruel, MD. Her past medical history and cardiovascular risk factors include: Hyperlipidemia, GERD w/ hx of fundoplication, osteopenia, arthritis, fibromyalgia, sciatica, costochondritis, general anxiety disorder.  Though she is referred to the practice for palpitation she is more concerned about precordial pain and shortness of breath.    Chest pain: Ongoing for the last several months, similar to her heartburn prior to her fundoplication surgery, no change in intensity/frequency/duration, intensity is 8 out of 10, duration less than 5 minutes, nonradiating, does not get worse with effort related activities, resolves with burping or massaging her chest, and self-limited.  At times when she walks her dog she has noticed dyspnea on exertion otherwise no significant change.  At times has palpitations/fluttering in the chest but currently asymptomatic.  FUNCTIONAL STATUS: She walks her dog on a daily basis for 30 minutes.  And goes to the gym and walks on the treadmill for 30 minutes every other day.  ALLERGIES: Allergies  Allergen Reactions   Hydrocodone Nausea And Vomiting   Codeine Nausea And Vomiting   Macrodantin     CAUSES GERD    MEDICATION LIST PRIOR TO VISIT: Current Meds  Medication Sig   acetaminophen (TYLENOL) 500 MG tablet Take 1,000 mg by mouth every 6 (six) hours as needed for mild pain.   ALPRAZolam (XANAX) 0.25 MG tablet TAKE 1 TABLET BY  MOUTH AT BEDTIME AS NEEDED (Patient taking differently: Take 0.25 mg by mouth at bedtime as needed for sleep.)   atorvastatin (LIPITOR) 20 MG tablet Take 20 mg by mouth daily.   Baclofen 5 MG TABS Take 5 mg by mouth 2 (two) times daily as needed for muscle spasms.   Calcium Carb-Cholecalciferol 600-10 MG-MCG CAPS Take 1 tablet by mouth daily.   cetirizine (ZYRTEC) 10 MG chewable tablet Chew 10 mg by mouth daily as needed (allergies).   Cholecalciferol (VITAMIN D3) 50 MCG (2000 UT) capsule Take 2,000 Units by mouth daily.   FLUoxetine (PROZAC) 40 MG capsule Take 40 mg by mouth daily.   hyoscyamine (LEVSIN) 0.125 MG tablet Take 0.125 mg by mouth every 6 (six) hours as needed for cramping.   Magnesium Oxide 250 MG TABS Take 250 mg by mouth every other day.    ondansetron (ZOFRAN ODT) 4 MG disintegrating tablet Take 1 tablet (4 mg total) by mouth every 8 (eight) hours as needed for nausea or vomiting.     PAST MEDICAL HISTORY: Past Medical History:  Diagnosis Date   Arthritis    Carotidynia    Costochondritis    Deafness    left ear   Fibromyalgia    GAD (generalized anxiety disorder)    GERD (gastroesophageal reflux disease)    Had surgery for   High cholesterol    Hypothyroid    Nocturia    Osteoarthritis    Osteopenia 08/2018   T score -1.9 FRAX 6.2% / 1.1% overall stable from prior DEXA  PONV (postoperative nausea and vomiting)    Sciatica    Sleep disturbance    Vitamin D deficiency     PAST SURGICAL HISTORY: Past Surgical History:  Procedure Laterality Date   BREAST BIOPSY Left 2018   benign   CHOLECYSTECTOMY  2005   ESOPHAGUS SURGERY  2008   EXCISION/RELEASE BURSA HIP Right 10/19/2016   Procedure: Right hip bursectomy excision portion ileotibial band;  Surgeon: Latanya Maudlin, MD;  Location: [facility];  Service: Orthopedics;  Laterality: Right;   KNEE SURGERY     Arthroscopic  right   TOTAL KNEE ARTHROPLASTY Right 11/14/2016   Procedure: RIGHT TOTAL KNEE ARTHROPLASTY;   Surgeon: Gaynelle Arabian, MD;  Location: [facility];  Service: Orthopedics;  Laterality: Right;   TUBAL LIGATION      FAMILY HISTORY: The patient family history includes Glaucoma in her sister; Heart failure in her brother; Stomach cancer in her brother.  SOCIAL HISTORY:  The patient  reports that she has never smoked. She has never used smokeless tobacco. She reports current alcohol use of about 3.0 standard drinks of alcohol per week. She reports that she does not use drugs.  REVIEW OF SYSTEMS: Review of Systems  Constitutional: Positive for malaise/fatigue and weight gain.  Cardiovascular:  Positive for chest pain and dyspnea on exertion. Negative for claudication, irregular heartbeat, leg swelling, near-syncope, orthopnea, palpitations, paroxysmal nocturnal dyspnea and syncope.  Respiratory:  Negative for shortness of breath.   Hematologic/Lymphatic: Negative for bleeding problem.  Musculoskeletal:  Negative for muscle cramps and myalgias.  Gastrointestinal:  Positive for heartburn.  Neurological:  Negative for dizziness and light-headedness.    PHYSICAL EXAM:    10/08/2021   12:41 PM 09/07/2021    3:05 PM 09/07/2021    2:00 PM  Vitals with BMI  Height '5\' 0"'     Weight 157 lbs    BMI 38.18    Systolic 299 371 696  Diastolic 79 70 75  Pulse 61 68 78    CONSTITUTIONAL: Well-developed and well-nourished. No acute distress.  SKIN: Skin is warm and dry. No rash noted. No cyanosis. No pallor. No jaundice HEAD: Normocephalic and atraumatic.  EYES: No scleral icterus MOUTH/THROAT: Moist oral membranes.  NECK: No JVD present. No thyromegaly noted. No carotid bruits  LYMPHATIC: No visible cervical adenopathy.  CHEST Normal respiratory effort. No intercostal retractions  LUNGS: Clear to auscultation bilaterally.  No wheezes rales or rhonchi's.   CARDIOVASCULAR: Regular rate and rhythm, positive S1-S2, no murmurs rubs or gallops appreciated. ABDOMINAL: Soft, nontender, nondistended,  positive bowel sounds in all 4 quadrants, no apparent ascites.  EXTREMITIES: No peripheral edema, warm to touch, 2+ bilateral DP and PT pulses HEMATOLOGIC: No significant bruising NEUROLOGIC: Oriented to person, place, and time. Nonfocal. Normal muscle tone.  PSYCHIATRIC: Normal mood and affect. Normal behavior. Cooperative  CARDIAC DATABASE: EKG: 10/08/2021: Sinus Rhythm, 82bpm, normal axis, without underlying injury pattern.   Echocardiogram: 09/05/2016 outside records provided by PCP: Per report LVEF 78%, grade 1 diastolic impairment, moderate LVH, trace TR.  Stress Testing: No results found for this or any previous visit from the past 1095 days.   Heart Catheterization: None  LABORATORY DATA:    Latest Ref Rng & Units 09/07/2021    1:00 PM 06/22/2021    5:28 AM 01/01/2018    9:13 PM  CBC  WBC 4.0 - 10.5 K/uL 5.4  7.4  7.7   Hemoglobin 12.0 - 15.0 g/dL 13.8  13.7  13.2   Hematocrit 36.0 - 46.0 %  40.9  42.0  41.6   Platelets 150 - 400 K/uL 267  216  309        Latest Ref Rng & Units 09/07/2021    1:00 PM 06/22/2021    5:28 AM 01/01/2018    9:13 PM  CMP  Glucose 70 - 99 mg/dL 104  149  107   BUN 8 - 23 mg/dL '17  14  13   ' Creatinine 0.44 - 1.00 mg/dL 0.69  0.76  0.87   Sodium 135 - 145 mmol/L 139  137  139   Potassium 3.5 - 5.1 mmol/L 3.8  4.4  4.0   Chloride 98 - 111 mmol/L 109  104  107   CO2 22 - 32 mmol/L '23  26  23   ' Calcium 8.9 - 10.3 mg/dL 9.3  8.5  9.6   Total Protein 6.5 - 8.1 g/dL 6.9  6.7  6.6   Total Bilirubin 0.3 - 1.2 mg/dL 0.6  0.9  0.9   Alkaline Phos 38 - 126 U/L 86  89  96   AST 15 - 41 U/L 27  34  23   ALT 0 - 44 U/L '30  24  24     ' Lipid Panel     Component Value Date/Time   CHOL 236 (H) 07/06/2011 0952   TRIG 105 07/06/2011 0952   HDL 66 07/06/2011 0952   CHOLHDL 3.6 07/06/2011 0952   VLDL 21 07/06/2011 0952   LDLCALC 149 (H) 07/06/2011 0952    No components found for: "NTPROBNP" No results for input(s): "PROBNP" in the last 8760  hours. No results for input(s): "TSH" in the last 8760 hours.  BMP Recent Labs    06/22/21 0528 09/07/21 1300  NA 137 139  K 4.4 3.8  CL 104 109  CO2 26 23  GLUCOSE 149* 104*  BUN 14 17  CREATININE 0.76 0.69  CALCIUM 8.5* 9.3  GFRNONAA >60 >60    HEMOGLOBIN A1C No results found for: "HGBA1C", "MPG"  External Labs:  Date Collected: 06/16/2021 , information obtained by referring physician Potassium: 4.9 BUN 17, creatinine 0.72 mg/dL. eGFR: 87 mL/min per 1.73 m   IMPRESSION:    ICD-10-CM   1. Precordial pain  R07.2 PCV CARDIAC STRESS TEST    PCV ECHOCARDIOGRAM COMPLETE    CT CARDIAC SCORING ([facility] LOCATIONS ONLY)    2. Palpitation  R00.2 EKG 12-Lead    3. Pure hypercholesterolemia  E78.00        RECOMMENDATIONS: KRYSTYN PICKING is a 76 y.o.  female whose past medical history and cardiac risk factors include: Hyperlipidemia, GERD w/ hx of fundoplication, osteopenia, arthritis, fibromyalgia, sciatica, costochondritis, general anxiety disorder.  Precordial pain Predominant noncardiac. Likely secondary to her underlying heartburn/prior fundoplication surgery. Shared decision was to proceed with echocardiogram to evaluate for structural heart disease, exercise treadmill stress test to evaluate for functional status and exercise-induced ischemia, and coronary artery calcification score. EKG: Nonischemic  Palpitation Original referral was for palpitations; however, patient is more concerned about her precordial pain.  Work-up as outlined above. Patient is asked to keep being more cognizant of her symptoms and if present additional work-up can be under taken.   Pure hypercholesterolemia Currently on atorvastatin. Currently managed by primary care provider.  Data Reviewed: I have independently reviewed external notes provided by the referring provider as part of this office visit.   I have independently reviewed outside labs, EKG, old echocardiogram report as part of  medical decision making. I  have ordered the following tests:  Orders Placed This Encounter  Procedures   CT CARDIAC SCORING ([facility] LOCATIONS ONLY)    Standing Status:   Future    Standing Expiration Date:   10/09/2022    Order Specific Question:   Preferred imaging location?    Answer:   [facility]    Order Specific Question:   Release to patient    Answer:   Immediate   PCV CARDIAC STRESS TEST    Standing Status:   Future    Standing Expiration Date:   10/09/2022   EKG 12-Lead   PCV ECHOCARDIOGRAM COMPLETE    Standing Status:   Future    Standing Expiration Date:   10/09/2022  I have made no medications changes at today's encounter as noted above.  FINAL MEDICATION LIST END OF ENCOUNTER: No orders of the defined types were placed in this encounter.   Medications Discontinued During This Encounter  Medication Reason   estradiol (ESTRACE VAGINAL) 0.1 MG/GM vaginal cream    amitriptyline (ELAVIL) 25 MG tablet      Current Outpatient Medications:    acetaminophen (TYLENOL) 500 MG tablet, Take 1,000 mg by mouth every 6 (six) hours as needed for mild pain., Disp: , Rfl:    ALPRAZolam (XANAX) 0.25 MG tablet, TAKE 1 TABLET BY MOUTH AT BEDTIME AS NEEDED (Patient taking differently: Take 0.25 mg by mouth at bedtime as needed for sleep.), Disp: 30 tablet, Rfl: 1   atorvastatin (LIPITOR) 20 MG tablet, Take 20 mg by mouth daily., Disp: , Rfl:    Baclofen 5 MG TABS, Take 5 mg by mouth 2 (two) times daily as needed for muscle spasms., Disp: , Rfl:    Calcium Carb-Cholecalciferol 600-10 MG-MCG CAPS, Take 1 tablet by mouth daily., Disp: , Rfl:    cetirizine (ZYRTEC) 10 MG chewable tablet, Chew 10 mg by mouth daily as needed (allergies)., Disp: , Rfl:    Cholecalciferol (VITAMIN D3) 50 MCG (2000 UT) capsule, Take 2,000 Units by mouth daily., Disp: , Rfl:    FLUoxetine (PROZAC) 40 MG capsule, Take 40 mg by mouth daily., Disp: , Rfl:    hyoscyamine (LEVSIN) 0.125 MG tablet, Take 0.125 mg by mouth every 6  (six) hours as needed for cramping., Disp: , Rfl:    Magnesium Oxide 250 MG TABS, Take 250 mg by mouth every other day. , Disp: , Rfl:    ondansetron (ZOFRAN ODT) 4 MG disintegrating tablet, Take 1 tablet (4 mg total) by mouth every 8 (eight) hours as needed for nausea or vomiting., Disp: 20 tablet, Rfl: 0  Orders Placed This Encounter  Procedures   CT CARDIAC SCORING ([facility] LOCATIONS ONLY)   PCV CARDIAC STRESS TEST   EKG 12-Lead   PCV ECHOCARDIOGRAM COMPLETE    There are no Patient Instructions on file for this visit.   --Continue cardiac medications as reconciled in final medication list. --Return in about 7 weeks (around 11/26/2021) for Follow up, Chest pain, Review test results. or sooner if needed. --Continue follow-up with your primary care physician regarding the management of your other chronic comorbid conditions.  Patient's questions and concerns were addressed to her satisfaction. She voices understanding of the instructions provided during this encounter.   This note was created using a voice recognition software as a result there may be grammatical errors inadvertently enclosed that do not reflect the nature of this encounter. Every attempt is made to correct such errors.  Rex Kras, [place], [facility]  Pager: [phone] Office: [phone]

## 2021-10-08 ENCOUNTER — Encounter: Payer: Self-pay | Admitting: Cardiology

## 2021-10-08 ENCOUNTER — Ambulatory Visit: Payer: Medicare (Managed Care) | Admitting: Cardiology

## 2021-10-08 VITALS — BP 141/79 | HR 61 | Temp 98.9°F | Resp 16 | Ht 60.0 in | Wt 157.0 lb

## 2021-10-08 DIAGNOSIS — R072 Precordial pain: Secondary | ICD-10-CM

## 2021-10-08 DIAGNOSIS — R002 Palpitations: Secondary | ICD-10-CM

## 2021-10-08 DIAGNOSIS — E7800 Pure hypercholesterolemia, unspecified: Secondary | ICD-10-CM | POA: Diagnosis not present

## 2021-10-19 ENCOUNTER — Ambulatory Visit
Admission: RE | Admit: 2021-10-19 | Discharge: 2021-10-19 | Disposition: A | Discharge status: Home / Self Care | Payer: No Typology Code available for payment source | Source: Ambulatory Visit | Attending: Cardiology | Admitting: Cardiology

## 2021-10-19 DIAGNOSIS — R072 Precordial pain: Secondary | ICD-10-CM

## 2021-10-20 DIAGNOSIS — K602 Anal fissure, unspecified: Secondary | ICD-10-CM | POA: Diagnosis not present

## 2021-10-20 DIAGNOSIS — K580 Irritable bowel syndrome with diarrhea: Secondary | ICD-10-CM | POA: Diagnosis not present

## 2021-10-21 DIAGNOSIS — L820 Inflamed seborrheic keratosis: Secondary | ICD-10-CM | POA: Diagnosis not present

## 2021-10-21 DIAGNOSIS — L720 Epidermal cyst: Secondary | ICD-10-CM | POA: Diagnosis not present

## 2021-10-21 DIAGNOSIS — D485 Neoplasm of uncertain behavior of skin: Secondary | ICD-10-CM | POA: Diagnosis not present

## 2021-10-21 DIAGNOSIS — L821 Other seborrheic keratosis: Secondary | ICD-10-CM | POA: Diagnosis not present

## 2021-10-25 ENCOUNTER — Emergency Department (HOSPITAL_BASED_OUTPATIENT_CLINIC_OR_DEPARTMENT_OTHER): Payer: Medicare (Managed Care)

## 2021-10-25 ENCOUNTER — Other Ambulatory Visit: Payer: Self-pay

## 2021-10-25 ENCOUNTER — Encounter (HOSPITAL_BASED_OUTPATIENT_CLINIC_OR_DEPARTMENT_OTHER): Payer: Self-pay | Admitting: Emergency Medicine

## 2021-10-25 ENCOUNTER — Emergency Department (HOSPITAL_BASED_OUTPATIENT_CLINIC_OR_DEPARTMENT_OTHER)
Admission: EM | Admit: 2021-10-25 | Discharge: 2021-10-25 | Disposition: A | Discharge status: Home / Self Care | Payer: Medicare (Managed Care) | Attending: Emergency Medicine | Admitting: Emergency Medicine

## 2021-10-25 DIAGNOSIS — S2242XA Multiple fractures of ribs, left side, initial encounter for closed fracture: Secondary | ICD-10-CM | POA: Diagnosis not present

## 2021-10-25 DIAGNOSIS — R0789 Other chest pain: Secondary | ICD-10-CM | POA: Diagnosis not present

## 2021-10-25 DIAGNOSIS — E039 Hypothyroidism, unspecified: Secondary | ICD-10-CM | POA: Insufficient documentation

## 2021-10-25 DIAGNOSIS — X503XXA Overexertion from repetitive movements, initial encounter: Secondary | ICD-10-CM | POA: Insufficient documentation

## 2021-10-25 DIAGNOSIS — S299XXA Unspecified injury of thorax, initial encounter: Secondary | ICD-10-CM | POA: Diagnosis present

## 2021-10-25 MED ORDER — OXYCODONE HCL 5 MG PO TABS
5.0000 mg | ORAL_TABLET | Freq: Once | ORAL | Status: DC
  Filled 2021-10-25: qty 1

## 2021-10-25 MED ORDER — HYDROCODONE-ACETAMINOPHEN 5-325 MG PO TABS
1.0000 | ORAL_TABLET | Freq: Once | ORAL | Status: AC
  Administered 2021-10-25: 1 via ORAL
  Filled 2021-10-25: qty 1

## 2021-10-25 MED ORDER — LIDOCAINE 5 % EX PTCH
1.0000 | MEDICATED_PATCH | CUTANEOUS | Status: DC
  Administered 2021-10-25: 1 via TRANSDERMAL
  Filled 2021-10-25: qty 1

## 2021-10-25 MED ORDER — KETOROLAC TROMETHAMINE 30 MG/ML IJ SOLN
30.0000 mg | Freq: Once | INTRAMUSCULAR | Status: AC
  Administered 2021-10-25: 30 mg via INTRAMUSCULAR
  Filled 2021-10-25: qty 1

## 2021-10-25 MED ORDER — HYDROCODONE-ACETAMINOPHEN 5-325 MG PO TABS: 1.0000 | ORAL_TABLET | Freq: Four times a day (QID) | ORAL | 0 refills | Status: DC | PRN

## 2021-10-25 MED ORDER — LIDOCAINE 5 % EX PTCH: 1.0000 | MEDICATED_PATCH | CUTANEOUS | 0 refills | Status: DC

## 2021-10-25 NOTE — Discharge Instructions (Addendum)
You have 2 small nondisplaced rib fractures of the left chest.  It is extremely important you continue to use the incentive spirometer every 2-3 hours while awake while your ribs are healing.  You can take Tylenol 1 g every 6-8 hours and ibuprofen 600 mg every 4-6 hours for pain.  You can also take the Norco as needed for pain control.  Do not take while drinking alcohol or driving.  Follow-up with your primary care physician regarding your visit to the ER today.  Come back to the ER if you have any severe worsening pain, high fevers and cough, worsening shortness of breath, or any other symptoms concerning to you.

## 2021-10-25 NOTE — ED Notes (Signed)
D/c paperwork reviewed with pt, including prescriptions. All questions addressed prior to d/c. Ambulatory to ED exit without assistance, NAD.

## 2021-10-25 NOTE — ED Provider Notes (Signed)
[name] Provider Note   CSN: [id number] Arrival date & time: 10/25/21  1226     History  Chief Complaint  Patient presents with   Pam [name] is a 76 y.o. female.  With PMH of osteopenia, arthritis, GERD, HLD, hypothyroidism who presents with chest wall pain after recently accidentally rolling off her bed while trying to answer the phone 3 days prior.  Patient says her bed is quite high and the phone was ringing Friday night 3 days prior to presentation when she rolled off the bed onto her left side and rib cage.  She did not hit her head or lose consciousness.  She had immediate pain to her left chest wall.  She has had worsening pain today which brought her in.  She denies any headache, nausea, vomiting, shortness of breath, cough, bloody diarrhea, abdominal pain.   Fall       Home Medications Prior to Admission medications   Medication Sig Start Date End Date Taking? Authorizing Provider  acetaminophen (TYLENOL) 500 MG tablet Take 1,000 mg by mouth every 6 (six) hours as needed for mild pain.    [provider]  ALPRAZolam (XANAX) 0.25 MG tablet TAKE 1 TABLET BY MOUTH AT BEDTIME AS NEEDED Patient taking differently: Take 0.25 mg by mouth at bedtime as needed for sleep. 09/08/16   Huel Cote, NP  atorvastatin (LIPITOR) 20 MG tablet Take 20 mg by mouth daily.    [provider]  Baclofen 5 MG TABS Take 5 mg by mouth 2 (two) times daily as needed for muscle spasms. 04/20/21   [provider]  Calcium Carb-Cholecalciferol 600-10 MG-MCG CAPS Take 1 tablet by mouth daily.    [provider]  cetirizine (ZYRTEC) 10 MG chewable tablet Chew 10 mg by mouth daily as needed (allergies).    [provider]  Cholecalciferol (VITAMIN D3) 50 MCG (2000 UT) capsule Take 2,000 Units by mouth daily.    [provider]  FLUoxetine (PROZAC) 40 MG capsule Take 40 mg by mouth daily. 05/24/21   [provider]  hyoscyamine (LEVSIN) 0.125 MG tablet Take 0.125 mg by mouth every 6 (six) hours as needed for cramping. 04/10/21   [provider]  Magnesium Oxide 250 MG TABS Take 250 mg by mouth every other day.     [provider]  ondansetron (ZOFRAN ODT) 4 MG disintegrating tablet Take 1 tablet (4 mg total) by mouth every 8 (eight) hours as needed for nausea or vomiting. 05/26/20   Henderly, Britni A, PA-C      Allergies    Hydrocodone, Codeine, and Macrodantin    Review of Systems   Review of Systems  Physical Exam Updated Vital Signs BP 126/72   Pulse 90   Temp 98 F (36.7 C) (Oral)   Resp 20   SpO2 97%  Physical Exam Constitutional: Alert and oriented. Well appearing and in no distress. Eyes: Conjunctivae are normal. ENT      Head: Normocephalic and atraumatic.      Nose: No congestion.      Mouth/Throat: Mucous membranes are moist.      Neck: No stridor. Cardiovascular: S1, S2,  Normal and symmetric distal pulses are present in all extremities.Warm and well perfused.  Point tenderness over left anterolateral mid chest wall, no external evidence of injury, no ecchymoses, no contusions, no crepitus. Respiratory: Normal respiratory effort. Breath sounds are normal.  O2 sat 97 on RA.  Gastrointestinal: Soft and nontender. There is no CVA tenderness. Musculoskeletal: Normal range of motion in all extremities. Neurologic: Normal speech and language.  Moving all extremities equally.  Sensation grossly intact.  No gross focal neurologic deficits are appreciated. Skin: Skin is warm, dry and intact. No rash noted. Psychiatric: Mood and affect are normal. Speech and behavior are normal.  ED Results / Procedures / Treatments   Labs (all labs ordered are listed, but only abnormal results are displayed) Labs Reviewed - No data to display  EKG None  Radiology DG Ribs Unilateral W/Chest Left  Result Date: 10/25/2021 CLINICAL DATA:  [place] onto the left side four  days ago. Left chest wall pain. EXAM: LEFT RIBS AND CHEST - 3+ VIEW COMPARISON:  01/13/2021. FINDINGS: Nondisplaced fractures of the anterolateral left fifth and sixth ribs. No other convincing fractures. No bone lesions. Cardiac silhouette is normal in size. No mediastinal or hilar masses. Clear lungs. No pleural effusion or pneumothorax. IMPRESSION: 1. Nondisplaced fractures of the anterolateral left fifth and sixth ribs. 2. No acute cardiopulmonary disease. Electronically Signed   By: Lajean Manes M.D.   On: 10/25/2021 13:34    Procedures Procedures    Medications Ordered in ED Medications  lidocaine (LIDODERM) 5 % 1 patch (1 patch Transdermal Patch Applied 10/25/21 1512)  ketorolac (TORADOL) 30 MG/ML injection 30 mg (30 mg Intramuscular Given 10/25/21 1510)  HYDROcodone-acetaminophen (NORCO/VICODIN) 5-325 MG per tablet 1 tablet (1 tablet Oral Given 10/25/21 1515)    ED Course/ Medical Decision Making/ A&P                           Medical Decision Making Pam [name] is a 76 y.o. female.  With PMH of osteopenia, arthritis, GERD, HLD, hypothyroidism who presents with chest wall pain after recently accidentally rolling off her bed while trying to answer the phone 3 days prior.  Patient has point tenderness to anterolateral chest wall after mechanical injury.  She had no head trauma or loss of consciousness and is neurologically intact and does not require any head imaging.  She is not on anticoagulation.  No other appreciated injuries.  Abdomen is benign.  Her chest x-ray showed evidence of left nondisplaced fifth and sixth rib fractures with no underlying pneumo thorax and no evidence of pleural contusion.  She has pulled 1100 on her incentive spirometer.  She is ambulatory.  I gave patient Norco, Toradol shot and Lidoderm patch in the ER.  I will discharge patient with Lidoderm patches, as needed Norco and incentive spirometer.  I advised the importance of continuing incentive spirometry while  her rib fractures are healing and strict return precautions of when to return.  She is in agreement with this plan and will follow up with PCP.  She is safe for discharge.  Amount and/or Complexity of Data Reviewed Radiology: ordered.    Details: Personal interpretation of patient's chest x-ray shows no evidence of pneumothorax, isolated nondisplaced 2 rib fractures, agree with rads read  Risk Prescription drug management.   Final Clinical Impression(s) / ED Diagnoses Final diagnoses:  Chest wall pain  Closed fracture of multiple ribs of left side, initial encounter    Rx / DC Orders ED Discharge Orders     None         Elgie Congo, MD 10/25/21 1601

## 2021-10-25 NOTE — ED Triage Notes (Signed)
Patient presents to ED via POV from home. Patient reports on Friday she rolled off her bed while trying to answer the phone. This morning she woke up in severe pain to her left rib cage. Denies LOC. Denies hitting head. Not on blood thinners.

## 2021-10-25 NOTE — ED Notes (Signed)
Pt ambulatory to bathroom, standby assistance.  

## 2021-10-29 ENCOUNTER — Other Ambulatory Visit: Payer: Medicare (Managed Care)

## 2021-11-03 NOTE — Progress Notes (Signed)
Called and spoke to patient she voiced understanding

## 2021-11-21 DIAGNOSIS — S2242XA Multiple fractures of ribs, left side, initial encounter for closed fracture: Secondary | ICD-10-CM | POA: Diagnosis not present

## 2021-11-21 DIAGNOSIS — W06XXXA Fall from bed, initial encounter: Secondary | ICD-10-CM | POA: Diagnosis not present

## 2021-11-21 DIAGNOSIS — R0781 Pleurodynia: Secondary | ICD-10-CM | POA: Diagnosis not present

## 2021-11-23 ENCOUNTER — Emergency Department (HOSPITAL_BASED_OUTPATIENT_CLINIC_OR_DEPARTMENT_OTHER)
Admission: EM | Admit: 2021-11-23 | Discharge: 2021-11-23 | Disposition: A | Discharge status: Home / Self Care | Payer: Medicare (Managed Care) | Attending: Emergency Medicine | Admitting: Emergency Medicine

## 2021-11-23 ENCOUNTER — Emergency Department (HOSPITAL_BASED_OUTPATIENT_CLINIC_OR_DEPARTMENT_OTHER): Payer: Medicare (Managed Care)

## 2021-11-23 ENCOUNTER — Encounter (HOSPITAL_BASED_OUTPATIENT_CLINIC_OR_DEPARTMENT_OTHER): Payer: Self-pay | Admitting: Emergency Medicine

## 2021-11-23 DIAGNOSIS — R0600 Dyspnea, unspecified: Secondary | ICD-10-CM | POA: Diagnosis not present

## 2021-11-23 DIAGNOSIS — S2242XD Multiple fractures of ribs, left side, subsequent encounter for fracture with routine healing: Secondary | ICD-10-CM | POA: Insufficient documentation

## 2021-11-23 DIAGNOSIS — R0789 Other chest pain: Secondary | ICD-10-CM | POA: Diagnosis not present

## 2021-11-23 DIAGNOSIS — W19XXXD Unspecified fall, subsequent encounter: Secondary | ICD-10-CM | POA: Diagnosis not present

## 2021-11-23 DIAGNOSIS — S299XXD Unspecified injury of thorax, subsequent encounter: Secondary | ICD-10-CM | POA: Diagnosis present

## 2021-11-23 DIAGNOSIS — S2242XA Multiple fractures of ribs, left side, initial encounter for closed fracture: Secondary | ICD-10-CM | POA: Diagnosis not present

## 2021-11-23 DIAGNOSIS — R0689 Other abnormalities of breathing: Secondary | ICD-10-CM | POA: Diagnosis not present

## 2021-11-23 MED ORDER — OXYCODONE-ACETAMINOPHEN 5-325 MG PO TABS
1.0000 | ORAL_TABLET | ORAL | Status: DC | PRN
  Administered 2021-11-23: 1 via ORAL
  Filled 2021-11-23: qty 1

## 2021-11-23 MED ORDER — METHOCARBAMOL 500 MG PO TABS: 500.0000 mg | ORAL_TABLET | Freq: Two times a day (BID) | ORAL | 0 refills | Status: DC

## 2021-11-23 MED ORDER — OXYCODONE-ACETAMINOPHEN 5-325 MG PO TABS: 1.0000 | ORAL_TABLET | Freq: Four times a day (QID) | ORAL | 0 refills | Status: DC | PRN

## 2021-11-23 MED ORDER — ONDANSETRON 4 MG PO TBDP
4.0000 mg | ORAL_TABLET | Freq: Once | ORAL | Status: AC
  Administered 2021-11-23: 4 mg via ORAL
  Filled 2021-11-23: qty 1

## 2021-11-23 NOTE — ED Provider Notes (Signed)
[facility] Provider Note   CSN: [id number] Arrival date & time: 11/23/21  1309     History  Chief Complaint  Patient presents with   Chest Pain    L RIB    Pam [name] is a 76 y.o. female with past medical history significant for fibromyalgia, costochondritis, and known left rib fractures that occurred on 9/4 after fall who presents with concern for ongoing pain severely worsened today with difficulty breathing.  Patient reports that she felt like something was pulling in the left side and did not have any relief of pain with Tylenol, ibuprofen, tramadol.  Patient reports that she is out of the pain medications that were originally prescribed on 9/4, does not think that her tramadol is working because it was old, she denies any cough, productive sputum, fever, chills.   Chest Pain      Home Medications Prior to Admission medications   Medication Sig Start Date End Date Taking? Authorizing Provider  methocarbamol (ROBAXIN) 500 MG tablet Take 1 tablet (500 mg total) by mouth 2 (two) times daily. 11/23/21  Yes Beautifull Cisar H, PA-C  oxyCODONE-acetaminophen (PERCOCET/ROXICET) 5-325 MG tablet Take 1 tablet by mouth every 6 (six) hours as needed for severe pain. 11/23/21  Yes Temprence Rhines H, PA-C  acetaminophen (TYLENOL) 500 MG tablet Take 1,000 mg by mouth every 6 (six) hours as needed for mild pain.    [provider]  ALPRAZolam (XANAX) 0.25 MG tablet TAKE 1 TABLET BY MOUTH AT BEDTIME AS NEEDED Patient taking differently: Take 0.25 mg by mouth at bedtime as needed for sleep. 09/08/16   Huel Cote, NP  atorvastatin (LIPITOR) 20 MG tablet Take 20 mg by mouth daily.    [provider]  Baclofen 5 MG TABS Take 5 mg by mouth 2 (two) times daily as needed for muscle spasms. 04/20/21   [provider]  Calcium Carb-Cholecalciferol 600-10 MG-MCG CAPS Take 1 tablet by mouth daily.    [provider]   cetirizine (ZYRTEC) 10 MG chewable tablet Chew 10 mg by mouth daily as needed (allergies).    [provider]  Cholecalciferol (VITAMIN D3) 50 MCG (2000 UT) capsule Take 2,000 Units by mouth daily.    [provider]  FLUoxetine (PROZAC) 40 MG capsule Take 40 mg by mouth daily. 05/24/21   [provider]  hyoscyamine (LEVSIN) 0.125 MG tablet Take 0.125 mg by mouth every 6 (six) hours as needed for cramping. 04/10/21   [provider]  lidocaine (LIDODERM) 5 % Place 1 patch onto the skin daily. Remove & Discard patch within 12 hours or as directed by MD 10/25/21   Elgie Congo, MD  Magnesium Oxide 250 MG TABS Take 250 mg by mouth every other day.     [provider]  ondansetron (ZOFRAN ODT) 4 MG disintegrating tablet Take 1 tablet (4 mg total) by mouth every 8 (eight) hours as needed for nausea or vomiting. 05/26/20   Henderly, Britni A, PA-C      Allergies    Hydrocodone, Codeine, and Macrodantin    Review of Systems   Review of Systems  Cardiovascular:  Positive for chest pain.  All other systems reviewed and are negative.   Physical Exam Updated Vital Signs BP 131/70 (BP Location: Right Arm)   Pulse 73   Temp 97.8 F (36.6 C) (Oral)   Resp 18   Ht 5' (1.524 m)   Wt 66.7 kg   SpO2  97%   BMI 28.71 kg/m  Physical Exam Vitals and nursing note reviewed.  Constitutional:      General: She is not in acute distress.    Appearance: Normal appearance.  HENT:     Head: Normocephalic and atraumatic.  Eyes:     General:        Right eye: No discharge.        Left eye: No discharge.  Cardiovascular:     Rate and Rhythm: Normal rate and regular rhythm.  Pulmonary:     Effort: Pulmonary effort is normal. No respiratory distress.     Comments: Normal lung sounds bilaterally, no wheezing, rhonchi, rales, focal consolidation noted Chest:     Comments: Tenderness to palpation left chest wall at the location of previously identified rib  fractures, no step-off, deformity, crepitus Musculoskeletal:        General: No deformity.  Skin:    General: Skin is warm and dry.  Neurological:     Mental Status: She is alert and oriented to person, place, and time.  Psychiatric:        Mood and Affect: Mood normal.        Behavior: Behavior normal.     ED Results / Procedures / Treatments   Labs (all labs ordered are listed, but only abnormal results are displayed) Labs Reviewed - No data to display  EKG None  Radiology DG Ribs Unilateral W/Chest Left  Result Date: 11/23/2021 CLINICAL DATA:  Continued left rib pain and difficulty breathing since fall a month ago. EXAM: LEFT RIBS AND CHEST - 3+ VIEW COMPARISON:  November 21, 2021. FINDINGS: Subacute nondisplaced left anterolateral fifth and sixth rib fractures again noted. No new rib fracture. There is no evidence of pneumothorax or pleural effusion. Both lungs are clear. Heart size and mediastinal contours are within normal limits. IMPRESSION: 1. Unchanged subacute nondisplaced left anterolateral fifth and sixth rib fractures. No new rib fracture. Electronically Signed   By: Titus Dubin M.D.   On: 11/23/2021 14:21    Procedures Procedures    Medications Ordered in ED Medications  oxyCODONE-acetaminophen (PERCOCET/ROXICET) 5-325 MG per tablet 1 tablet (1 tablet Oral Given 11/23/21 1328)  ondansetron (ZOFRAN-ODT) disintegrating tablet 4 mg (4 mg Oral Given 11/23/21 1328)    ED Course/ Medical Decision Making/ A&P                           Medical Decision Making Amount and/or Complexity of Data Reviewed Radiology: ordered.  Risk Prescription drug management.   This is an overall well-appearing 76 year old female who presents with concern for ongoing left-sided chest pain from known rib fracture around 4 weeks ago.  Independently interpreted radiographic imaging including plain film radiograph of the chest which shows the same overall appearance of left-sided rib  fractures, no new pneumonia or new fractures or abnormality with current fracture pattern.  Independently interpreted EKG which shows normal sinus rhythm, my attending physician Dr. Billy Fischer confirmed this rhythm.  Patient symptoms are consistent with ongoing pain related to the right rib fractures with some ongoing pain slightly out of proportion to expected although given patient's history of some chronic pain, fibromyalgia, costochondritis, understandable presentation today.  Encourage patient to continue her incentive spirometry, lidocaine patches, will prescribe muscle relaxant as well as a very short course of some stronger pain medication given her significant ongoing symptoms.  Discussed patient needs to follow-up with PCP, for any further pain medication and discuss  interval healing if she is not having significant resolution in the coming weeks.  Patient understands agrees to plan, is discharged in stable condition at this time. Final Clinical Impression(s) / ED Diagnoses Final diagnoses:  Closed fracture of multiple ribs of left side with routine healing, subsequent encounter    Rx / DC Orders ED Discharge Orders          Ordered    methocarbamol (ROBAXIN) 500 MG tablet  2 times daily        11/23/21 1649    oxyCODONE-acetaminophen (PERCOCET/ROXICET) 5-325 MG tablet  Every 6 hours PRN        11/23/21 1649              Ezrie Bunyan, [place] H, PA-C 11/23/21 1701    Gareth Morgan, MD 11/24/21 1143

## 2021-11-23 NOTE — ED Triage Notes (Signed)
Pt c/o difficulty breathing associated with left rib fractures that occurred on 9/4. Unable to find relief with home pain meds. Pt took Tramadol 45 minutes pta. Movement and deep breathing worsens pain. Denies new injury.
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
## 2021-11-26 ENCOUNTER — Ambulatory Visit: Payer: Medicare (Managed Care) | Admitting: Cardiology

## 2021-12-03 ENCOUNTER — Other Ambulatory Visit: Payer: Medicare (Managed Care)

## 2021-12-03 DIAGNOSIS — B370 Candidal stomatitis: Secondary | ICD-10-CM | POA: Diagnosis not present

## 2021-12-09 ENCOUNTER — Ambulatory Visit: Payer: Medicare (Managed Care) | Admitting: Cardiology

## 2021-12-13 DIAGNOSIS — B370 Candidal stomatitis: Secondary | ICD-10-CM | POA: Diagnosis not present

## 2021-12-28 DIAGNOSIS — R5383 Other fatigue: Secondary | ICD-10-CM | POA: Diagnosis not present

## 2021-12-28 DIAGNOSIS — Z6830 Body mass index (BMI) 30.0-30.9, adult: Secondary | ICD-10-CM | POA: Diagnosis not present

## 2021-12-28 DIAGNOSIS — K1379 Other lesions of oral mucosa: Secondary | ICD-10-CM | POA: Diagnosis not present

## 2021-12-28 DIAGNOSIS — M8588 Other specified disorders of bone density and structure, other site: Secondary | ICD-10-CM | POA: Diagnosis not present

## 2021-12-29 ENCOUNTER — Other Ambulatory Visit: Payer: Self-pay | Admitting: Family Medicine

## 2021-12-29 DIAGNOSIS — M8580 Other specified disorders of bone density and structure, unspecified site: Secondary | ICD-10-CM

## 2021-12-29 DIAGNOSIS — Z1231 Encounter for screening mammogram for malignant neoplasm of breast: Secondary | ICD-10-CM

## 2022-01-03 ENCOUNTER — Telehealth: Payer: Self-pay | Admitting: Physician Assistant

## 2022-01-03 NOTE — Telephone Encounter (Signed)
Scheduled appointment per 11/09 referral. Patient is aware of appointment date and time. Patient is aware to arrive 15 mins prior to appointment time and to bring updated insurance cards. Patient is aware of location.   

## 2022-01-10 ENCOUNTER — Encounter: Payer: Self-pay | Admitting: Family Medicine

## 2022-01-10 ENCOUNTER — Ambulatory Visit

## 2022-01-18 NOTE — Progress Notes (Signed)
[facility] Telephone:([phone]) [phone]   Fax:([phone]) [place] NOTE  Patient Care Team: Lawerance Cruel, MD as PCP - General (Family Medicine)  Hematological/Oncological History Labs from PCP, Dr. Lawerance Cruel at [facility]: -12/22/2020: WBC 4.3, Hgb 14.1, Plt 244, ANC 1.3 (L) -06/16/2021: WBC 4.7, Hgb 13.3, Plt 252, ANC 1.6 (L) -12/28/2021: WBC 5.5, Hgb 13.5, Plt 263, ALC 3.00 (H)  -01/19/2022: Establish care with [facility] with Dr. Narda Rutherford and Dede Query PA-C  CHIEF COMPLAINTS/PURPOSE OF CONSULTATION:  Lymphocytosis with fatigue  HISTORY OF PRESENTING ILLNESS:  Tanashia M Pralle 76 y.o. female with medical history significant for fibromyalgia, GERD, h/o of hiatal hernia, osteopenia, osteoarthritis who presents to the hematology clinic for persistent elevation of lymphocyte count and progressive fatigue. She is unaccompanied for this visit.   On exam today, Mrs. Bickham reports that she is struggling with fatigue but is unsure the underlying cause. She is very stressed as her husband was recently found to have recurrent metastatic cancer. She adds that she is anxious with her husband's health. She is able to complete all her ADLs on her own. She does have mid sternal burning sensation and some dysphagia to solid foods. She reports nausea when she wakes up without any vomiting episodes. She reports a 4 lb weight loss that was intentional with diet modifications. She has occasional episodes of diarrhea that is generally triggered by certain foods. She denies easy bruising or signs of active bleeding. She reports some shortness of breath but feels it is related to her anxiety. She denies fevers, chills, sweats, chest pain or cough. She has no other complaints. Rest of the 10 point ROS is below.   MEDICAL HISTORY:  Past Medical History:  Diagnosis Date   Arthritis    Carotidynia    Costochondritis    Deafness    left ear   Fibromyalgia    GAD  (generalized anxiety disorder)    GERD (gastroesophageal reflux disease)    Had surgery for   High cholesterol    Hypothyroid    Nocturia    Osteoarthritis    Osteopenia 08/2018   T score -1.9 FRAX 6.2% / 1.1% overall stable from prior DEXA   PONV (postoperative nausea and vomiting)    Sciatica    Sleep disturbance    Vitamin D deficiency     SURGICAL HISTORY: Past Surgical History:  Procedure Laterality Date   BREAST BIOPSY Left 2018   benign   CHOLECYSTECTOMY  2005   ESOPHAGUS SURGERY  2008   EXCISION/RELEASE BURSA HIP Right 10/19/2016   Procedure: Right hip bursectomy excision portion ileotibial band;  Surgeon: Latanya Maudlin, MD;  Location: [facility];  Service: Orthopedics;  Laterality: Right;   KNEE SURGERY     Arthroscopic  right   TOTAL KNEE ARTHROPLASTY Right 11/14/2016   Procedure: RIGHT TOTAL KNEE ARTHROPLASTY;  Surgeon: Gaynelle Arabian, MD;  Location: [facility];  Service: Orthopedics;  Laterality: Right;   TUBAL LIGATION      SOCIAL HISTORY: Social History   Socioeconomic History   Marital status: Married    Spouse name: Not on file   Number of children: 3   Years of education: Not on file   Highest education level: Not on file  Occupational History   Not on file  Tobacco Use   Smoking status: Never   Smokeless tobacco: Never  Vaping Use   Vaping Use: Never used  Substance and Sexual Activity   Alcohol use: Yes  Alcohol/week: 3.0 standard drinks of alcohol    Types: 3 Standard drinks or equivalent per week    Comment: OCCASIONAL   Drug use: No   Sexual activity: Not Currently    Birth control/protection: Surgical, Post-menopausal    Comment: 1st intercourse 21 yo-2 partners  Other Topics Concern   Not on file  Social History Narrative   Lives with husband   Social Determinants of Health   Financial Resource Strain: Not on file  Food Insecurity: Not on file  Transportation Needs: Not on file  Physical Activity: Not on file  Stress: Not on file   Social Connections: Not on file  Intimate Partner Violence: Not on file    FAMILY HISTORY: Family History  Problem Relation Age of Onset   Glaucoma Sister    Heart failure Brother    Liver cancer Brother     ALLERGIES:  is allergic to hydrocodone, codeine, and macrodantin.  MEDICATIONS:  Current Outpatient Medications  Medication Sig Dispense Refill   acetaminophen (TYLENOL) 500 MG tablet Take 1,000 mg by mouth every 6 (six) hours as needed for mild pain.     ALPRAZolam (XANAX) 0.25 MG tablet TAKE 1 TABLET BY MOUTH AT BEDTIME AS NEEDED (Patient taking differently: Take 0.25 mg by mouth at bedtime as needed for sleep.) 30 tablet 1   atorvastatin (LIPITOR) 20 MG tablet Take 20 mg by mouth daily.     Baclofen 5 MG TABS Take 5 mg by mouth 2 (two) times daily as needed for muscle spasms.     Calcium Carb-Cholecalciferol 600-10 MG-MCG CAPS Take 1 tablet by mouth daily.     cetirizine (ZYRTEC) 10 MG chewable tablet Chew 10 mg by mouth daily as needed (allergies).     Cholecalciferol (VITAMIN D3) 50 MCG (2000 UT) capsule Take 2,000 Units by mouth daily.     FLUoxetine (PROZAC) 40 MG capsule Take 40 mg by mouth daily.     hyoscyamine (LEVSIN) 0.125 MG tablet Take 0.125 mg by mouth every 6 (six) hours as needed for cramping.     Magnesium Oxide 250 MG TABS Take 250 mg by mouth every other day.      ondansetron (ZOFRAN ODT) 4 MG disintegrating tablet Take 1 tablet (4 mg total) by mouth every 8 (eight) hours as needed for nausea or vomiting. 20 tablet 0   methocarbamol (ROBAXIN) 500 MG tablet Take 1 tablet (500 mg total) by mouth 2 (two) times daily. 20 tablet 0   No current facility-administered medications for this visit.    REVIEW OF SYSTEMS:   Constitutional: ( - ) fevers, ( - )  chills , ( - ) night sweats Eyes: ( - ) blurriness of vision, ( - ) double vision, ( - ) watery eyes Ears, nose, mouth, throat, and face: ( - ) mucositis, ( - ) sore throat Respiratory: ( - ) cough, ( - )  dyspnea, ( - ) wheezes Cardiovascular: ( - ) palpitation, ( - ) chest discomfort, ( - ) lower extremity swelling Gastrointestinal:  ( - ) nausea, ( - ) heartburn, ( - ) change in bowel habits Skin: ( - ) abnormal skin rashes Lymphatics: ( - ) new lymphadenopathy, ( - ) easy bruising Neurological: ( - ) numbness, ( - ) tingling, ( - ) new weaknesses Behavioral/Psych: ( - ) mood change, ( - ) new changes  All other systems were reviewed with the patient and are negative.  PHYSICAL EXAMINATION: ECOG PERFORMANCE STATUS: 1 - Symptomatic but  completely ambulatory  Vitals:   01/19/22 1047  BP: 125/68  Pulse: 88  Resp: 16  Temp: 97.9 F (36.6 C)  SpO2: 97%   Filed Weights   01/19/22 1047  Weight: 154 lb 9.6 oz (70.1 kg)    GENERAL: well appearing female in NAD  SKIN: skin color, texture, turgor are normal, no rashes or significant lesions EYES: conjunctiva are pink and non-injected, sclera clear OROPHARYNX: no exudate, no erythema; lips, buccal mucosa, and tongue normal  NECK: supple, non-tender LYMPH:  no palpable lymphadenopathy in the cervical, axillary or supraclavicular lymph nodes.  LUNGS: clear to auscultation and percussion with normal breathing effort HEART: regular rate & rhythm and no murmurs and no lower extremity edema ABDOMEN: soft, non-tender, non-distended, normal bowel sounds Musculoskeletal: no cyanosis of digits and no clubbing  PSYCH: alert & oriented x 3, fluent speech NEURO: no focal motor/sensory deficits  LABORATORY DATA:  I have reviewed the data as listed    Latest Ref Rng & Units 01/19/2022   11:41 AM 09/07/2021    1:00 PM 06/22/2021    5:28 AM  CBC  WBC 4.0 - 10.5 K/uL 5.1  5.4  7.4   Hemoglobin 12.0 - 15.0 g/dL 14.1  13.8  13.7   Hematocrit 36.0 - 46.0 % 42.7  40.9  42.0   Platelets 150 - 400 K/uL 266  267  216        Latest Ref Rng & Units 01/19/2022   11:41 AM 09/07/2021    1:00 PM 06/22/2021    5:28 AM  CMP  Glucose 70 - 99 mg/dL 104  104   149   BUN 8 - 23 mg/dL _0 Creatinine 0.44 - 1.00 mg/dL 0.74  0.69  0.76   Sodium 135 - 145 mmol/L 139  139  137   Potassium 3.5 - 5.1 mmol/L 4.4  3.8  4.4   Chloride 98 - 111 mmol/L 106  109  104   CO2 22 - 32 mmol/L _1 Calcium 8.9 - 10.3 mg/dL 9.9  9.3  8.5   Total Protein 6.5 - 8.1 g/dL 7.4  6.9  6.7   Total Bilirubin 0.3 - 1.2 mg/dL 0.5  0.6  0.9   Alkaline Phos 38 - 126 U/L 99  86  89   AST 15 - 41 U/L 18  27  34   ALT 0 - 44 U/L _2 ASSESSMENT & PLAN RICARDA ATAYDE is a 76 y.o. female who presents to the clinic for history of lymphocytosis with fatigue. We reviewed possible etiologies including inflammatory process, infectious process or lymphoproliferative process. We will proceed with laboratory evaluation today to check CBC, CMP, LDH, ESR and CRP levels. If there is persistent lymphocytosis, we will order flow cytometry to evaluation for monoclonal B cell or T cell population. Regarding patient's ongoing fatigue, we will evaluate for nutritional deficiencies. Patient expressed understanding and would like to proceed with the workup.   #Lymphocytosis: --Labs today to check CBC, CMP, LDH, ESR and CRP levels --Consider flow cytometry if there is evidence of lymphocytosis identified with today's labs.  --Patient will return to our clinic based on today's workup.   #Fatigue: --Etiology unknown but patient is under severe stress due to husband's health.  --Will check iron, vitamin B12 and folate levels today --If above workup is negative, encouraged patient to follow up with PCP for  further evaluation.   #Central chest discomfort/nausea: --History of GERD, fungal esophagitis and underwent fundoplication.  --Recommend patient follow up with her GI team, Dr. Therisa Doyne.   Orders Placed This Encounter  Procedures   CBC with Differential ([facility] Only)    Standing Status:   Future    Number of Occurrences:   1    Standing Expiration Date:    01/20/2023   CMP ([facility] only)    Standing Status:   Future    Number of Occurrences:   1    Standing Expiration Date:   01/20/2023   Ferritin    Standing Status:   Future    Number of Occurrences:   1    Standing Expiration Date:   01/20/2023   Iron and Iron Binding Capacity ([facility] only)    Standing Status:   Future    Number of Occurrences:   1    Standing Expiration Date:   01/20/2023   Retic Panel    Standing Status:   Future    Number of Occurrences:   1    Standing Expiration Date:   01/20/2023   Vitamin B12    Standing Status:   Future    Number of Occurrences:   1    Standing Expiration Date:   01/19/2023   Folate RBC    Standing Status:   Future    Number of Occurrences:   1    Standing Expiration Date:   01/19/2023   Methylmalonic acid, serum    Standing Status:   Future    Number of Occurrences:   1    Standing Expiration Date:   01/19/2023   Flow Cytometry, Peripheral Blood (Oncology)    Standing Status:   Future    Number of Occurrences:   1    Standing Expiration Date:   01/20/2023   Sedimentation rate    Standing Status:   Future    Number of Occurrences:   1    Standing Expiration Date:   01/19/2023   C-reactive protein    Standing Status:   Future    Number of Occurrences:   1    Standing Expiration Date:   01/19/2023    All questions were answered. The patient knows to call the clinic with any problems, questions or concerns.  I have spent a total of 60 minutes minutes of face-to-face and non-face-to-face time, preparing to see the patient, obtaining and/or reviewing separately obtained history, performing a medically appropriate examination, counseling and educating the patient, ordering tests/procedures, documenting clinical information in the electronic health record, independently interpreting results and communicating results to the patient, and care coordination.   Dede Query, PA-C Department of Hematology/Oncology [place] at [facility] Phone: [phone]  Patient was seen with Dr. Lorenso Courier  I have read the above note and personally examined the patient. I agree with the assessment and plan as noted above.  Briefly Mrs. Loucinda Croy is a 76 year old female who presents for evaluation of lymphocytosis.  At this time it appears that she has a relative lymphocytosis, with a relative increase of lymphocytes due to a lower neutrophil count.  This does not represent a true lymphocytosis, rather a mild decrease in the neutrophils.  In the interest of thoroughness we will order flow cytometry in order to assure her lymphocytes are not monoclonal.  Additionally we will order ESR, CRP, and nutritional labs.  Of note she does not have have a frank neutropenia  as her Venus is consistently above 1.5, though she did have an [place] of 1.2 on 12/01/2016.  If no clear etiology is noted in her above labs there is no need for routine follow-up in our clinic.  Patient voiced understanding with plan moving forward.  Ledell Peoples, MD Department of Hematology/Oncology [place] at [facility] Phone: [phone] Pager: [phone] Email: [email or web address]

## 2022-01-19 ENCOUNTER — Inpatient Hospital Stay: Payer: Medicare (Managed Care) | Attending: Physician Assistant | Admitting: Physician Assistant

## 2022-01-19 ENCOUNTER — Encounter: Payer: Self-pay | Admitting: Physician Assistant

## 2022-01-19 ENCOUNTER — Inpatient Hospital Stay: Payer: Medicare (Managed Care)

## 2022-01-19 VITALS — BP 125/68 | HR 88 | Temp 97.9°F | Resp 16 | Ht 60.0 in | Wt 154.6 lb

## 2022-01-19 DIAGNOSIS — R5383 Other fatigue: Secondary | ICD-10-CM | POA: Insufficient documentation

## 2022-01-19 DIAGNOSIS — D72820 Lymphocytosis (symptomatic): Secondary | ICD-10-CM | POA: Insufficient documentation

## 2022-01-19 DIAGNOSIS — Z800 Family history of malignant neoplasm of digestive organs: Secondary | ICD-10-CM | POA: Insufficient documentation

## 2022-01-19 DIAGNOSIS — R0789 Other chest pain: Secondary | ICD-10-CM | POA: Insufficient documentation

## 2022-01-19 LAB — C-REACTIVE PROTEIN: CRP: 0.6 mg/dL (ref <1.0)

## 2022-01-19 LAB — CBC WITH DIFFERENTIAL (CANCER CENTER ONLY)
Abs Immature Granulocytes: 0.01 10*3/uL (ref 0.00–0.07)
Basophils Absolute: 0 10*3/uL (ref 0.0–0.1)
Basophils Relative: 1 %
Eosinophils Absolute: 0.2 10*3/uL (ref 0.0–0.5)
Eosinophils Relative: 3 %
HCT: 42.7 % (ref 36.0–46.0)
Hemoglobin: 14.1 g/dL (ref 12.0–15.0)
Immature Granulocytes: 0 %
Lymphocytes Relative: 50 %
Lymphs Abs: 2.5 10*3/uL (ref 0.7–4.0)
MCH: 32.3 pg (ref 26.0–34.0)
MCHC: 33 g/dL (ref 30.0–36.0)
MCV: 97.7 fL (ref 80.0–100.0)
Monocytes Absolute: 0.3 10*3/uL (ref 0.1–1.0)
Monocytes Relative: 6 %
Neutro Abs: 2 10*3/uL (ref 1.7–7.7)
Neutrophils Relative %: 40 %
Platelet Count: 266 10*3/uL (ref 150–400)
RBC: 4.37 MIL/uL (ref 3.87–5.11)
RDW: 15.1 % (ref 11.5–15.5)
WBC Count: 5.1 10*3/uL (ref 4.0–10.5)
nRBC: 0 % (ref 0.0–0.2)

## 2022-01-19 LAB — RETIC PANEL
Immature Retic Fract: 13.6 % (ref 2.3–15.9)
RBC.: 4.3 MIL/uL (ref 3.87–5.11)
Retic Count, Absolute: 72.2 10*3/uL (ref 19.0–186.0)
Retic Ct Pct: 1.7 % (ref 0.4–3.1)
Reticulocyte Hemoglobin: 36.6 pg (ref >27.9)

## 2022-01-19 LAB — CMP (CANCER CENTER ONLY)
ALT: 19 U/L (ref 0–44)
AST: 18 U/L (ref 15–41)
Albumin: 4.6 g/dL (ref 3.5–5.0)
Alkaline Phosphatase: 99 U/L (ref 38–126)
Anion gap: 6 (ref 5–15)
BUN: 23 mg/dL (ref 8–23)
CO2: 27 mmol/L (ref 22–32)
Calcium: 9.9 mg/dL (ref 8.9–10.3)
Chloride: 106 mmol/L (ref 98–111)
Creatinine: 0.74 mg/dL (ref 0.44–1.00)
GFR, Estimated: >60 mL/min (ref >60)
Glucose, Bld: 104 mg/dL — ABNORMAL HIGH (ref 70–99)
Potassium: 4.4 mmol/L (ref 3.5–5.1)
Sodium: 139 mmol/L (ref 135–145)
Total Bilirubin: 0.5 mg/dL (ref 0.3–1.2)
Total Protein: 7.4 g/dL (ref 6.5–8.1)

## 2022-01-19 LAB — IRON AND IRON BINDING CAPACITY (CC-WL,HP ONLY)
Iron: 108 ug/dL (ref 28–170)
Saturation Ratios: 27 % (ref 10.4–31.8)
TIBC: 400 ug/dL (ref 250–450)
UIBC: 292 ug/dL (ref 148–442)

## 2022-01-19 LAB — SEDIMENTATION RATE: Sed Rate: 5 mm/hr (ref 0–22)

## 2022-01-19 LAB — VITAMIN B12: Vitamin B-12: 1022 pg/mL — ABNORMAL HIGH (ref 180–914)

## 2022-01-19 LAB — FERRITIN: Ferritin: 132 ng/mL (ref 11–307)

## 2022-01-20 LAB — FOLATE RBC
Folate, Hemolysate: >620 ng/mL
Folate, RBC: >1442 ng/mL (ref >498)
Hematocrit: 43 % (ref 34.0–46.6)

## 2022-01-22 LAB — METHYLMALONIC ACID, SERUM: Methylmalonic Acid, Quantitative: 94 nmol/L (ref 0–378)

## 2022-01-25 ENCOUNTER — Ambulatory Visit: Payer: Medicare (Managed Care)

## 2022-01-25 ENCOUNTER — Telehealth: Payer: Self-pay

## 2022-01-25 DIAGNOSIS — R072 Precordial pain: Secondary | ICD-10-CM

## 2022-01-25 NOTE — Telephone Encounter (Signed)
-----   Message from Lincoln Brigham, PA-C sent at 01/24/2022  4:29 PM EST ----- Please notify patient that her WBC and lymphocyte count were all normal. Remaining workup was negative. No further evaluation is needed. Patient can return to our clinic as needed.    ----- Message ----- From: Interface, Lab In [place] Sent: 01/19/2022  11:55 AM EST To: Lincoln Brigham, PA-C

## 2022-01-25 NOTE — Telephone Encounter (Signed)
Pt advised and voiced understanding.   

## 2022-02-03 ENCOUNTER — Ambulatory Visit: Payer: Medicare (Managed Care) | Admitting: Cardiology

## 2022-02-07 ENCOUNTER — Encounter: Payer: Self-pay | Admitting: Cardiology

## 2022-02-07 ENCOUNTER — Ambulatory Visit: Payer: Medicare (Managed Care) | Admitting: Cardiology

## 2022-02-07 VITALS — BP 127/81 | HR 72 | Resp 14 | Ht 60.0 in | Wt 154.4 lb

## 2022-02-07 DIAGNOSIS — Z712 Person consulting for explanation of examination or test findings: Secondary | ICD-10-CM | POA: Diagnosis not present

## 2022-02-07 DIAGNOSIS — R002 Palpitations: Secondary | ICD-10-CM | POA: Diagnosis not present

## 2022-02-07 DIAGNOSIS — E7800 Pure hypercholesterolemia, unspecified: Secondary | ICD-10-CM

## 2022-02-07 DIAGNOSIS — R072 Precordial pain: Secondary | ICD-10-CM

## 2022-02-07 NOTE — Progress Notes (Signed)
ID:  Pam [name], DOB 1945-08-07, MRN [id number]  PCP:  Lawerance Cruel, MD  Cardiologist:  Rex Kras, DO, [facility]  (established care 10/08/2021)  Date: 02/07/22 Last Office Visit: 10/08/2021  Chief Complaint  Patient presents with   Chest Pain   Follow-up    7 week    HPI  Pam [name] is a 76 y.o.  female whose past medical history and cardiovascular risk factors include: minimal CAC (0.435AU, 26th percentile), Hyperlipidemia, GERD w/ hx of fundoplication, osteopenia, arthritis, fibromyalgia, sciatica, costochondritis, general anxiety disorder.  Patient was referred to the practice for evaluation of palpitations but was more concerned with regards to her precordial pain at the time of consultation.  Her precordial discomfort suggestive to be noncardiac but due to her risk factors that shared decision was to proceed with coronary calcium score, exercise treadmill stress test, and echocardiogram.  She is noted to have very minimal CAC with a total score of 0.4 placing her at the 26 percentile, LVEF is preserved, no significant valvular heart disease, and a low risk exercise treadmill stress test.  Since last office visit her precordial pain has improved.  It is mostly brought on by anxiety, regurgitation of food, and at times taking a deep breath.  She describes the discomfort as " I can feel my esophagus tightening."  Of note, she does have a history of fundoplication which may need to be readdressed according to the patient.  She does not have precordial pain with effort related activities nor does it improved with resting and relaxing.  Patient states that she is also under a lot of stress/anxiety as her husband was diagnosed with recurrent cancer.  FUNCTIONAL STATUS: She walks her dog on a daily basis for 30 minutes.  And goes to the gym and walks on the treadmill for 30 minutes every other day.  ALLERGIES: Allergies  Allergen Reactions   Hydrocodone Nausea And  Vomiting   Codeine Nausea And Vomiting   Macrodantin     CAUSES GERD    MEDICATION LIST PRIOR TO VISIT: Current Meds  Medication Sig   acetaminophen (TYLENOL) 500 MG tablet Take 1,000 mg by mouth every 6 (six) hours as needed for mild pain.   ALPRAZolam (XANAX) 0.25 MG tablet TAKE 1 TABLET BY MOUTH AT BEDTIME AS NEEDED (Patient taking differently: Take 0.25 mg by mouth at bedtime as needed for sleep.)   atorvastatin (LIPITOR) 20 MG tablet Take 20 mg by mouth daily.   Calcium Carb-Cholecalciferol 600-10 MG-MCG CAPS Take 1 tablet by mouth daily.   cetirizine (ZYRTEC) 10 MG chewable tablet Chew 10 mg by mouth daily as needed (allergies).   Cholecalciferol (VITAMIN D3) 50 MCG (2000 UT) capsule Take 2,000 Units by mouth daily.   FLUoxetine (PROZAC) 40 MG capsule Take 40 mg by mouth daily.   hyoscyamine (LEVSIN) 0.125 MG tablet Take 0.125 mg by mouth every 6 (six) hours as needed for cramping.   Magnesium Oxide 250 MG TABS Take 250 mg by mouth every other day.      PAST MEDICAL HISTORY: Past Medical History:  Diagnosis Date   Arthritis    Carotidynia    Costochondritis    Deafness    left ear   Fibromyalgia    GAD (generalized anxiety disorder)    GERD (gastroesophageal reflux disease)    Had surgery for   High cholesterol    Hypothyroid    Nocturia    Osteoarthritis    Osteopenia 08/2018   T  score -1.9 FRAX 6.2% / 1.1% overall stable from prior DEXA   PONV (postoperative nausea and vomiting)    Sciatica    Sleep disturbance    Vitamin D deficiency     PAST SURGICAL HISTORY: Past Surgical History:  Procedure Laterality Date   BREAST BIOPSY Left 2018   benign   CHOLECYSTECTOMY  2005   ESOPHAGUS SURGERY  2008   EXCISION/RELEASE BURSA HIP Right 10/19/2016   Procedure: Right hip bursectomy excision portion ileotibial band;  Surgeon: Latanya Maudlin, MD;  Location: [facility];  Service: Orthopedics;  Laterality: Right;   KNEE SURGERY     Arthroscopic  right   TOTAL KNEE  ARTHROPLASTY Right 11/14/2016   Procedure: RIGHT TOTAL KNEE ARTHROPLASTY;  Surgeon: Gaynelle Arabian, MD;  Location: [facility];  Service: Orthopedics;  Laterality: Right;   TUBAL LIGATION      FAMILY HISTORY: The patient family history includes Glaucoma in her sister; Heart failure in her brother; Liver cancer in her brother.  SOCIAL HISTORY:  The patient  reports that she has never smoked. She has never used smokeless tobacco. She reports current alcohol use of about 3.0 standard drinks of alcohol per week. She reports that she does not use drugs.  REVIEW OF SYSTEMS: Review of Systems  Constitutional: Positive for weight loss.  Cardiovascular:  Positive for dyspnea on exertion. Negative for claudication, irregular heartbeat, leg swelling, near-syncope, orthopnea, palpitations, paroxysmal nocturnal dyspnea and syncope.  Respiratory:  Negative for shortness of breath.   Hematologic/Lymphatic: Negative for bleeding problem.  Musculoskeletal:  Negative for muscle cramps and myalgias.  Gastrointestinal:  Positive for heartburn.  Neurological:  Negative for dizziness and light-headedness.  Psychiatric/Behavioral:         Stress, anxiety     PHYSICAL EXAM:    02/07/2022    2:33 PM 01/19/2022   10:47 AM 11/23/2021    5:06 PM  Vitals with BMI  Height 5' 0" 5' 0"   Weight 154 lbs 6 oz 154 lbs 10 oz   BMI 18.84 16.60   Systolic 630 160 109  Diastolic 81 68 81  Pulse 72 88 68    CONSTITUTIONAL: Well-developed and well-nourished. No acute distress.  SKIN: Skin is warm and dry. No rash noted. No cyanosis. No pallor. No jaundice HEAD: Normocephalic and atraumatic.  EYES: No scleral icterus MOUTH/THROAT: Moist oral membranes.  NECK: No JVD present. No thyromegaly noted. No carotid bruits  CHEST Normal respiratory effort. No intercostal retractions  LUNGS: Clear to auscultation bilaterally.  No wheezes rales or rhonchi's.   CARDIOVASCULAR: Regular rate and rhythm, positive S1-S2, no murmurs  rubs or gallops appreciated. ABDOMINAL: Soft, nontender, nondistended, positive bowel sounds in all 4 quadrants, no apparent ascites.  EXTREMITIES: No peripheral edema, warm to touch, 2+ bilateral DP and PT pulses HEMATOLOGIC: No significant bruising NEUROLOGIC: Oriented to person, place, and time. Nonfocal. Normal muscle tone.  PSYCHIATRIC: Normal mood and affect. Normal behavior. Cooperative  CARDIAC DATABASE: EKG: 10/08/2021: Sinus Rhythm, 82bpm, normal axis, without underlying injury pattern.   Echocardiogram: 01/25/2022: Normal LV systolic function with visual EF 60-65%. Left ventricle cavity is normal in size. Normal global wall motion. Normal diastolic filling pattern, normal LAP. Mild left ventricular hypertrophy.  No significant valvular heart disease. Compared to 09/05/2016 G1DD is now normal otherwise no significant change.   Stress Testing: Exercise treadmill stress test 01/25/2022: Exercise treadmill stress test performed using Bruce protocol. Patient reached 7.3 METS, and 98% of age predicted maximum heart rate. Exercise capacity was good.  No chest pain reported. Normal heart rate and hemodynamic response. Stress EKG revealed no ischemic changes. Low risk study.   Heart Catheterization: None  CT Cardiac Scoring: 10/19/2021 Left Main: 0   LAD: 0   LCx: 0   RCA: 0.435   Total Agatston Score: 0.435   MESA database percentile: 26  Summary: Total CAC 0.435 AU, 26thpercentile for patient's age, sex, and race. Aortic atherosclerosis   LABORATORY DATA:    Latest Ref Rng & Units 01/19/2022   11:41 AM 09/07/2021    1:00 PM 06/22/2021    5:28 AM  CBC  WBC 4.0 - 10.5 K/uL 5.1  5.4  7.4   Hemoglobin 12.0 - 15.0 g/dL 14.1  13.8  13.7   Hematocrit 36.0 - 46.0 % 34.0 - 46.6 % 42.7    43.0  40.9  42.0   Platelets 150 - 400 K/uL 266  267  216        Latest Ref Rng & Units 01/19/2022   11:41 AM 09/07/2021    1:00 PM 06/22/2021    5:28 AM  CMP  Glucose 70 - 99 mg/dL  104  104  149   BUN 8 - 23 mg/dL _0 Creatinine 0.44 - 1.00 mg/dL 0.74  0.69  0.76   Sodium 135 - 145 mmol/L 139  139  137   Potassium 3.5 - 5.1 mmol/L 4.4  3.8  4.4   Chloride 98 - 111 mmol/L 106  109  104   CO2 22 - 32 mmol/L _1 Calcium 8.9 - 10.3 mg/dL 9.9  9.3  8.5   Total Protein 6.5 - 8.1 g/dL 7.4  6.9  6.7   Total Bilirubin 0.3 - 1.2 mg/dL 0.5  0.6  0.9   Alkaline Phos 38 - 126 U/L 99  86  89   AST 15 - 41 U/L 18  27  34   ALT 0 - 44 U/L _2 Lipid Panel     Component Value Date/Time   CHOL 236 (H) 07/06/2011 0952   TRIG 105 07/06/2011 0952   HDL 66 07/06/2011 0952   CHOLHDL 3.6 07/06/2011 0952   VLDL 21 07/06/2011 0952   LDLCALC 149 (H) 07/06/2011 0952    No components found for: "NTPROBNP" No results for input(s): "PROBNP" in the last 8760 hours. No results for input(s): "TSH" in the last 8760 hours.  BMP Recent Labs    06/22/21 0528 09/07/21 1300 01/19/22 1141  NA 137 139 139  K 4.4 3.8 4.4  CL 104 109 106  CO2 _3 GLUCOSE 149* 104* 104*  BUN _4 CREATININE 0.76 0.69 0.74  CALCIUM 8.5* 9.3 9.9  GFRNONAA >60 >60 >60    HEMOGLOBIN A1C No results found for: "HGBA1C", "MPG"  External Labs:  Date Collected: 06/16/2021 , information obtained by referring physician Potassium: 4.9 BUN 17, creatinine 0.72 mg/dL. eGFR: 87 mL/min per 1.73 m   IMPRESSION:    ICD-10-CM   1. Precordial pain  R07.2     2. Palpitation  R00.2     3. Pure hypercholesterolemia  E78.00        RECOMMENDATIONS: Pam [name] is a 76 y.o.  female whose past medical history and cardiac risk factors include: Hyperlipidemia, GERD w/ hx of fundoplication, osteopenia, arthritis, fibromyalgia, sciatica, costochondritis, general anxiety disorder.  Precordial pain Noncardiac. Echocardiogram: Preserved LVEF, normal  diastolic function, no significant valvular heart disease. GXT: Low risk Agree with following up with gastroenterology given  her history of GERD/fundoplication  Palpitation No reoccurrence. No additional workup warranted at this time  Pure hypercholesterolemia Currently on atorvastatin.   She denies myalgia or other side effects. Currently managed by primary care provider.  From a cardiovascular standpoint no additional workup is warranted at this time.  I have encouraged her to follow-up with regards to noncardiac causes of precordial pain.  I would like to see her back on an annual basis or as needed.  Will leave at the patient's discretion.   FINAL MEDICATION LIST END OF ENCOUNTER: No orders of the defined types were placed in this encounter.   Medications Discontinued During This Encounter  Medication Reason   Baclofen 5 MG TABS Change in therapy   ondansetron (ZOFRAN ODT) 4 MG disintegrating tablet      Current Outpatient Medications:    acetaminophen (TYLENOL) 500 MG tablet, Take 1,000 mg by mouth every 6 (six) hours as needed for mild pain., Disp: , Rfl:    ALPRAZolam (XANAX) 0.25 MG tablet, TAKE 1 TABLET BY MOUTH AT BEDTIME AS NEEDED (Patient taking differently: Take 0.25 mg by mouth at bedtime as needed for sleep.), Disp: 30 tablet, Rfl: 1   atorvastatin (LIPITOR) 20 MG tablet, Take 20 mg by mouth daily., Disp: , Rfl:    Calcium Carb-Cholecalciferol 600-10 MG-MCG CAPS, Take 1 tablet by mouth daily., Disp: , Rfl:    cetirizine (ZYRTEC) 10 MG chewable tablet, Chew 10 mg by mouth daily as needed (allergies)., Disp: , Rfl:    Cholecalciferol (VITAMIN D3) 50 MCG (2000 UT) capsule, Take 2,000 Units by mouth daily., Disp: , Rfl:    FLUoxetine (PROZAC) 40 MG capsule, Take 40 mg by mouth daily., Disp: , Rfl:    hyoscyamine (LEVSIN) 0.125 MG tablet, Take 0.125 mg by mouth every 6 (six) hours as needed for cramping., Disp: , Rfl:    Magnesium Oxide 250 MG TABS, Take 250 mg by mouth every other day. , Disp: , Rfl:   No orders of the defined types were placed in this encounter.   There are no Patient  Instructions on file for this visit.   --Continue cardiac medications as reconciled in final medication list. --Return in about 1 year (around 02/08/2023) for Annual follow up visit. or sooner if needed. --Continue follow-up with your primary care physician regarding the management of your other chronic comorbid conditions.  Patient's questions and concerns were addressed to her satisfaction. She voices understanding of the instructions provided during this encounter.   This note was created using a voice recognition software as a result there may be grammatical errors inadvertently enclosed that do not reflect the nature of this encounter. Every attempt is made to correct such errors.  Rex Kras, [place], [facility]  Pager: [phone] Office: [phone]

## 2022-02-15 DIAGNOSIS — B349 Viral infection, unspecified: Secondary | ICD-10-CM | POA: Diagnosis not present

## 2022-02-15 DIAGNOSIS — U071 COVID-19: Secondary | ICD-10-CM | POA: Diagnosis not present

## 2022-02-24 DIAGNOSIS — K219 Gastro-esophageal reflux disease without esophagitis: Secondary | ICD-10-CM | POA: Diagnosis not present

## 2022-02-24 DIAGNOSIS — K920 Hematemesis: Secondary | ICD-10-CM | POA: Diagnosis not present

## 2022-02-24 DIAGNOSIS — R1319 Other dysphagia: Secondary | ICD-10-CM | POA: Diagnosis not present

## 2022-02-24 DIAGNOSIS — R112 Nausea with vomiting, unspecified: Secondary | ICD-10-CM | POA: Diagnosis not present

## 2022-02-24 DIAGNOSIS — R0789 Other chest pain: Secondary | ICD-10-CM | POA: Diagnosis not present

## 2022-02-24 DIAGNOSIS — Z8719 Personal history of other diseases of the digestive system: Secondary | ICD-10-CM | POA: Diagnosis not present

## 2022-02-24 DIAGNOSIS — Z9889 Other specified postprocedural states: Secondary | ICD-10-CM | POA: Diagnosis not present

## 2022-02-24 DIAGNOSIS — R109 Unspecified abdominal pain: Secondary | ICD-10-CM | POA: Diagnosis not present

## 2022-02-24 DIAGNOSIS — R197 Diarrhea, unspecified: Secondary | ICD-10-CM | POA: Diagnosis not present

## 2022-03-03 DIAGNOSIS — R197 Diarrhea, unspecified: Secondary | ICD-10-CM | POA: Diagnosis not present

## 2022-03-08 ENCOUNTER — Ambulatory Visit
Admission: RE | Admit: 2022-03-08 | Discharge: 2022-03-08 | Disposition: A | Discharge status: Home / Self Care | Source: Ambulatory Visit | Attending: Family Medicine | Admitting: Family Medicine

## 2022-03-08 DIAGNOSIS — Z1231 Encounter for screening mammogram for malignant neoplasm of breast: Secondary | ICD-10-CM | POA: Diagnosis not present

## 2022-03-09 DIAGNOSIS — R1319 Other dysphagia: Secondary | ICD-10-CM | POA: Diagnosis not present

## 2022-03-09 DIAGNOSIS — Z9889 Other specified postprocedural states: Secondary | ICD-10-CM | POA: Diagnosis not present

## 2022-04-01 DIAGNOSIS — R350 Frequency of micturition: Secondary | ICD-10-CM | POA: Diagnosis not present

## 2022-04-01 DIAGNOSIS — N3010 Interstitial cystitis (chronic) without hematuria: Secondary | ICD-10-CM | POA: Diagnosis not present

## 2022-04-08 DIAGNOSIS — N3010 Interstitial cystitis (chronic) without hematuria: Secondary | ICD-10-CM | POA: Diagnosis not present

## 2022-04-12 DIAGNOSIS — N3010 Interstitial cystitis (chronic) without hematuria: Secondary | ICD-10-CM | POA: Diagnosis not present

## 2022-04-18 DIAGNOSIS — K319 Disease of stomach and duodenum, unspecified: Secondary | ICD-10-CM | POA: Diagnosis not present

## 2022-04-18 DIAGNOSIS — Z1211 Encounter for screening for malignant neoplasm of colon: Secondary | ICD-10-CM | POA: Diagnosis not present

## 2022-04-18 DIAGNOSIS — D125 Benign neoplasm of sigmoid colon: Secondary | ICD-10-CM | POA: Diagnosis not present

## 2022-04-18 DIAGNOSIS — D122 Benign neoplasm of ascending colon: Secondary | ICD-10-CM | POA: Diagnosis not present

## 2022-04-18 DIAGNOSIS — R1319 Other dysphagia: Secondary | ICD-10-CM | POA: Diagnosis not present

## 2022-04-18 DIAGNOSIS — D123 Benign neoplasm of transverse colon: Secondary | ICD-10-CM | POA: Diagnosis not present

## 2022-04-18 DIAGNOSIS — K3189 Other diseases of stomach and duodenum: Secondary | ICD-10-CM | POA: Diagnosis not present

## 2022-04-18 DIAGNOSIS — Z9889 Other specified postprocedural states: Secondary | ICD-10-CM | POA: Diagnosis not present

## 2022-04-18 DIAGNOSIS — K635 Polyp of colon: Secondary | ICD-10-CM | POA: Diagnosis not present

## 2022-05-12 ENCOUNTER — Ambulatory Visit
Admission: RE | Admit: 2022-05-12 | Discharge: 2022-05-12 | Disposition: A | Discharge status: Home / Self Care | Payer: Medicare HMO | Source: Ambulatory Visit | Attending: Family Medicine | Admitting: Family Medicine

## 2022-05-12 ENCOUNTER — Other Ambulatory Visit: Payer: Self-pay

## 2022-05-12 DIAGNOSIS — R042 Hemoptysis: Secondary | ICD-10-CM | POA: Diagnosis not present

## 2022-05-12 DIAGNOSIS — Z6830 Body mass index (BMI) 30.0-30.9, adult: Secondary | ICD-10-CM | POA: Diagnosis not present

## 2022-05-17 DIAGNOSIS — H04123 Dry eye syndrome of bilateral lacrimal glands: Secondary | ICD-10-CM | POA: Diagnosis not present

## 2022-05-17 DIAGNOSIS — Z961 Presence of intraocular lens: Secondary | ICD-10-CM | POA: Diagnosis not present

## 2022-05-17 DIAGNOSIS — H43812 Vitreous degeneration, left eye: Secondary | ICD-10-CM | POA: Diagnosis not present

## 2022-05-17 DIAGNOSIS — H40013 Open angle with borderline findings, low risk, bilateral: Secondary | ICD-10-CM | POA: Diagnosis not present

## 2022-05-19 DIAGNOSIS — M5416 Radiculopathy, lumbar region: Secondary | ICD-10-CM | POA: Diagnosis not present

## 2022-05-25 DIAGNOSIS — H524 Presbyopia: Secondary | ICD-10-CM | POA: Diagnosis not present

## 2022-05-25 DIAGNOSIS — H52222 Regular astigmatism, left eye: Secondary | ICD-10-CM | POA: Diagnosis not present

## 2022-06-28 DIAGNOSIS — Z9889 Other specified postprocedural states: Secondary | ICD-10-CM | POA: Diagnosis not present

## 2022-06-28 DIAGNOSIS — Z8719 Personal history of other diseases of the digestive system: Secondary | ICD-10-CM | POA: Diagnosis not present

## 2022-06-28 DIAGNOSIS — R1319 Other dysphagia: Secondary | ICD-10-CM | POA: Diagnosis not present

## 2022-06-29 ENCOUNTER — Inpatient Hospital Stay: Admission: RE | Admit: 2022-06-29 | Source: Ambulatory Visit

## 2022-07-04 ENCOUNTER — Other Ambulatory Visit: Payer: Self-pay | Admitting: Family Medicine

## 2022-07-04 DIAGNOSIS — M8580 Other specified disorders of bone density and structure, unspecified site: Secondary | ICD-10-CM

## 2022-07-06 DIAGNOSIS — Z9889 Other specified postprocedural states: Secondary | ICD-10-CM | POA: Diagnosis not present

## 2022-07-06 DIAGNOSIS — Z8719 Personal history of other diseases of the digestive system: Secondary | ICD-10-CM | POA: Diagnosis not present

## 2022-07-06 DIAGNOSIS — Z48815 Encounter for surgical aftercare following surgery on the digestive system: Secondary | ICD-10-CM | POA: Diagnosis not present

## 2022-07-06 DIAGNOSIS — R1319 Other dysphagia: Secondary | ICD-10-CM | POA: Diagnosis not present

## 2022-07-14 DIAGNOSIS — K219 Gastro-esophageal reflux disease without esophagitis: Secondary | ICD-10-CM | POA: Diagnosis not present

## 2022-07-14 DIAGNOSIS — K449 Diaphragmatic hernia without obstruction or gangrene: Secondary | ICD-10-CM | POA: Diagnosis not present

## 2022-07-14 DIAGNOSIS — K222 Esophageal obstruction: Secondary | ICD-10-CM | POA: Diagnosis not present

## 2022-07-14 DIAGNOSIS — Z9889 Other specified postprocedural states: Secondary | ICD-10-CM | POA: Diagnosis not present

## 2022-07-14 DIAGNOSIS — K660 Peritoneal adhesions (postprocedural) (postinfection): Secondary | ICD-10-CM | POA: Diagnosis not present

## 2022-07-15 DIAGNOSIS — N3010 Interstitial cystitis (chronic) without hematuria: Secondary | ICD-10-CM | POA: Diagnosis not present

## 2022-07-15 DIAGNOSIS — H90A32 Mixed conductive and sensorineural hearing loss, unilateral, left ear with restricted hearing on the contralateral side: Secondary | ICD-10-CM | POA: Diagnosis not present

## 2022-07-15 DIAGNOSIS — R1314 Dysphagia, pharyngoesophageal phase: Secondary | ICD-10-CM | POA: Diagnosis not present

## 2022-07-15 DIAGNOSIS — K219 Gastro-esophageal reflux disease without esophagitis: Secondary | ICD-10-CM | POA: Diagnosis not present

## 2022-07-15 DIAGNOSIS — K222 Esophageal obstruction: Secondary | ICD-10-CM | POA: Diagnosis not present

## 2022-07-27 DIAGNOSIS — J690 Pneumonitis due to inhalation of food and vomit: Secondary | ICD-10-CM | POA: Diagnosis not present

## 2022-07-27 DIAGNOSIS — R911 Solitary pulmonary nodule: Secondary | ICD-10-CM | POA: Diagnosis not present

## 2022-07-27 DIAGNOSIS — R051 Acute cough: Secondary | ICD-10-CM | POA: Diagnosis not present

## 2022-07-27 DIAGNOSIS — K449 Diaphragmatic hernia without obstruction or gangrene: Secondary | ICD-10-CM | POA: Diagnosis not present

## 2022-07-27 DIAGNOSIS — T819XXA Unspecified complication of procedure, initial encounter: Secondary | ICD-10-CM | POA: Diagnosis not present

## 2022-07-27 DIAGNOSIS — R053 Chronic cough: Secondary | ICD-10-CM | POA: Diagnosis not present

## 2022-07-27 DIAGNOSIS — R1013 Epigastric pain: Secondary | ICD-10-CM | POA: Diagnosis not present

## 2022-07-27 DIAGNOSIS — K209 Esophagitis, unspecified without bleeding: Secondary | ICD-10-CM | POA: Diagnosis not present

## 2022-07-28 DIAGNOSIS — R079 Chest pain, unspecified: Secondary | ICD-10-CM | POA: Diagnosis not present

## 2022-08-06 DIAGNOSIS — J029 Acute pharyngitis, unspecified: Secondary | ICD-10-CM | POA: Diagnosis not present

## 2022-08-10 DIAGNOSIS — R059 Cough, unspecified: Secondary | ICD-10-CM | POA: Diagnosis not present

## 2022-08-10 DIAGNOSIS — Z6828 Body mass index (BMI) 28.0-28.9, adult: Secondary | ICD-10-CM | POA: Diagnosis not present

## 2022-08-10 DIAGNOSIS — B370 Candidal stomatitis: Secondary | ICD-10-CM | POA: Diagnosis not present

## 2022-08-10 DIAGNOSIS — R5383 Other fatigue: Secondary | ICD-10-CM | POA: Diagnosis not present

## 2022-08-10 DIAGNOSIS — F324 Major depressive disorder, single episode, in partial remission: Secondary | ICD-10-CM | POA: Diagnosis not present

## 2022-08-11 DIAGNOSIS — K224 Dyskinesia of esophagus: Secondary | ICD-10-CM | POA: Diagnosis not present

## 2022-08-11 DIAGNOSIS — K439 Ventral hernia without obstruction or gangrene: Secondary | ICD-10-CM | POA: Diagnosis not present

## 2022-08-11 DIAGNOSIS — K3189 Other diseases of stomach and duodenum: Secondary | ICD-10-CM | POA: Diagnosis not present

## 2022-08-11 DIAGNOSIS — K449 Diaphragmatic hernia without obstruction or gangrene: Secondary | ICD-10-CM | POA: Diagnosis not present

## 2022-08-11 DIAGNOSIS — K219 Gastro-esophageal reflux disease without esophagitis: Secondary | ICD-10-CM | POA: Diagnosis not present

## 2022-08-11 DIAGNOSIS — K222 Esophageal obstruction: Secondary | ICD-10-CM | POA: Diagnosis not present

## 2022-08-11 DIAGNOSIS — K2100 Gastro-esophageal reflux disease with esophagitis, without bleeding: Secondary | ICD-10-CM | POA: Diagnosis not present

## 2022-08-17 DIAGNOSIS — Z9889 Other specified postprocedural states: Secondary | ICD-10-CM | POA: Diagnosis not present

## 2022-08-17 DIAGNOSIS — Z4889 Encounter for other specified surgical aftercare: Secondary | ICD-10-CM | POA: Diagnosis not present

## 2022-09-07 ENCOUNTER — Institutional Professional Consult (permissible substitution): Admitting: Pulmonary Disease

## 2022-09-16 DIAGNOSIS — K130 Diseases of lips: Secondary | ICD-10-CM | POA: Diagnosis not present

## 2022-09-16 DIAGNOSIS — Z6828 Body mass index (BMI) 28.0-28.9, adult: Secondary | ICD-10-CM | POA: Diagnosis not present

## 2022-10-09 NOTE — Progress Notes (Deleted)
Synopsis: Referred in August 2024 for pulmonary nodule by Daisy Floro, MD  Subjective:   PATIENT ID: Pam [name] GENDER: female DOB: November 07, 1945, MRN: [id number]  No chief complaint on file.   This is a 77 year old female, history of arthritis, gastroesophageal reflux, hypercholesterolemia, hypothyroidism vitamin D deficiency.  Patient is a lifelong never smoker.Patient was referred for lung nodule.  Patient had a CT scan of the chest completed in June 2024 this images located at [facility].  Patient found to have a small recurrent hiatal hernia in the distal esophagus.  Had thyroid nodules that recommended evaluation by thyroid ultrasound and some similar tree-in-bud opacities within the right middle lobe concerning for questionable chronic MAI.    ***  Past Medical History:  Diagnosis Date   Arthritis    Carotidynia    Costochondritis    Deafness    left ear   Fibromyalgia    GAD (generalized anxiety disorder)    GERD (gastroesophageal reflux disease)    Had surgery for   High cholesterol    Hypothyroid    Nocturia    Osteoarthritis    Osteopenia 08/2018   T score -1.9 FRAX 6.2% / 1.1% overall stable from prior DEXA   PONV (postoperative nausea and vomiting)    Sciatica    Sleep disturbance    Vitamin D deficiency      Family History  Problem Relation Age of Onset   Glaucoma Sister    Heart failure Brother    Liver cancer Brother      Past Surgical History:  Procedure Laterality Date   BREAST BIOPSY Left 2018   benign   CHOLECYSTECTOMY  2005   ESOPHAGUS SURGERY  2008   EXCISION/RELEASE BURSA HIP Right 10/19/2016   Procedure: Right hip bursectomy excision portion ileotibial band;  Surgeon: Ranee Gosselin, MD;  Location: [facility];  Service: Orthopedics;  Laterality: Right;   KNEE SURGERY     Arthroscopic  right   TOTAL KNEE ARTHROPLASTY Right 11/14/2016   Procedure: RIGHT TOTAL KNEE ARTHROPLASTY;  Surgeon: Ollen Gross, MD;  Location: [facility];  Service:  Orthopedics;  Laterality: Right;   TUBAL LIGATION      Social History   Socioeconomic History   Marital status: Married    Spouse name: Not on file   Number of children: 3   Years of education: Not on file   Highest education level: Not on file  Occupational History   Not on file  Tobacco Use   Smoking status: Never   Smokeless tobacco: Never  Vaping Use   Vaping status: Never Used  Substance and Sexual Activity   Alcohol use: Yes    Alcohol/week: 3.0 standard drinks of alcohol    Types: 3 Standard drinks or equivalent per week    Comment: OCCASIONAL   Drug use: No   Sexual activity: Not Currently    Birth control/protection: Surgical, Post-menopausal    Comment: 1st intercourse 74 yo-2 partners  Other Topics Concern   Not on file  Social History Narrative   Lives with husband   Social Determinants of Health   Financial Resource Strain: Not on file  Food Insecurity: Low Risk  (08/17/2022)   Received from [facility]   Food vital sign    Within the past 12 months, you worried that your food would run out before you got money to buy more: Never true    Within the past 12 months, the food you bought just didn't last and you  didn't have money to get more. : Never true  Transportation Needs: Not on file (08/17/2022)  Physical Activity: Not on file  Stress: Not on file  Social Connections: Not on file  Intimate Partner Violence: Not on file     Allergies  Allergen Reactions   Hydrocodone Nausea And Vomiting   Codeine Nausea And Vomiting   Macrodantin     CAUSES GERD     Outpatient Medications Prior to Visit  Medication Sig Dispense Refill   acetaminophen (TYLENOL) 500 MG tablet Take 1,000 mg by mouth every 6 (six) hours as needed for mild pain.     ALPRAZolam (XANAX) 0.25 MG tablet TAKE 1 TABLET BY MOUTH AT BEDTIME AS NEEDED (Patient taking differently: Take 0.25 mg by mouth at bedtime as needed for sleep.) 30 tablet 1   atorvastatin (LIPITOR) 20 MG tablet Take  20 mg by mouth daily.     Calcium Carb-Cholecalciferol 600-10 MG-MCG CAPS Take 1 tablet by mouth daily.     cetirizine (ZYRTEC) 10 MG chewable tablet Chew 10 mg by mouth daily as needed (allergies).     Cholecalciferol (VITAMIN D3) 50 MCG (2000 UT) capsule Take 2,000 Units by mouth daily.     FLUoxetine (PROZAC) 40 MG capsule Take 40 mg by mouth daily.     hyoscyamine (LEVSIN) 0.125 MG tablet Take 0.125 mg by mouth every 6 (six) hours as needed for cramping.     Magnesium Oxide 250 MG TABS Take 250 mg by mouth every other day.      No facility-administered medications prior to visit.    ROS   Objective:  Physical Exam   There were no vitals filed for this visit.   on *** LPM *** RA BMI Readings from Last 3 Encounters:  02/07/22 30.15 kg/m  01/19/22 30.19 kg/m  11/23/21 28.71 kg/m   Wt Readings from Last 3 Encounters:  02/07/22 154 lb 6.4 oz (70 kg)  01/19/22 154 lb 9.6 oz (70.1 kg)  11/23/21 147 lb (66.7 kg)     CBC    Component Value Date/Time   WBC 5.1 01/19/2022 1141   WBC 5.4 09/07/2021 1300   RBC 4.37 01/19/2022 1141   RBC 4.30 01/19/2022 1141   HGB 14.1 01/19/2022 1141   HCT 42.7 01/19/2022 1141   HCT 43.0 01/19/2022 1141   PLT 266 01/19/2022 1141   MCV 97.7 01/19/2022 1141   MCH 32.3 01/19/2022 1141   MCHC 33.0 01/19/2022 1141   RDW 15.1 01/19/2022 1141   LYMPHSABS 2.5 01/19/2022 1141   MONOABS 0.3 01/19/2022 1141   EOSABS 0.2 01/19/2022 1141   BASOSABS 0.0 01/19/2022 1141    ***  Chest Imaging: ***  Pulmonary Functions Testing Results:     No data to display          FeNO: ***  Pathology: ***  Echocardiogram: ***  Heart Catheterization: ***    Assessment & Plan:   No diagnosis found.  Discussion: ***   Current Outpatient Medications:    acetaminophen (TYLENOL) 500 MG tablet, Take 1,000 mg by mouth every 6 (six) hours as needed for mild pain., Disp: , Rfl:    ALPRAZolam (XANAX) 0.25 MG tablet, TAKE 1 TABLET BY MOUTH AT  BEDTIME AS NEEDED (Patient taking differently: Take 0.25 mg by mouth at bedtime as needed for sleep.), Disp: 30 tablet, Rfl: 1   atorvastatin (LIPITOR) 20 MG tablet, Take 20 mg by mouth daily., Disp: , Rfl:    Calcium Carb-Cholecalciferol 600-10 MG-MCG CAPS, Take  1 tablet by mouth daily., Disp: , Rfl:    cetirizine (ZYRTEC) 10 MG chewable tablet, Chew 10 mg by mouth daily as needed (allergies)., Disp: , Rfl:    Cholecalciferol (VITAMIN D3) 50 MCG (2000 UT) capsule, Take 2,000 Units by mouth daily., Disp: , Rfl:    FLUoxetine (PROZAC) 40 MG capsule, Take 40 mg by mouth daily., Disp: , Rfl:    hyoscyamine (LEVSIN) 0.125 MG tablet, Take 0.125 mg by mouth every 6 (six) hours as needed for cramping., Disp: , Rfl:    Magnesium Oxide 250 MG TABS, Take 250 mg by mouth every other day. , Disp: , Rfl:   I spent *** minutes dedicated to the care of this patient on the date of this encounter to include pre-visit review of records, face-to-face time with the patient discussing conditions above, post visit ordering of testing, clinical documentation with the electronic health record, making appropriate referrals as documented, and communicating necessary findings to members of the patients care team.   Josephine Igo, DO [facility] 10/09/2022 2:26 PM

## 2022-10-10 ENCOUNTER — Institutional Professional Consult (permissible substitution): Admitting: Pulmonary Disease

## 2022-10-26 DIAGNOSIS — R911 Solitary pulmonary nodule: Secondary | ICD-10-CM | POA: Diagnosis not present

## 2022-11-07 DIAGNOSIS — R918 Other nonspecific abnormal finding of lung field: Secondary | ICD-10-CM | POA: Diagnosis not present

## 2022-11-09 DIAGNOSIS — F324 Major depressive disorder, single episode, in partial remission: Secondary | ICD-10-CM | POA: Diagnosis not present

## 2022-11-09 DIAGNOSIS — Z6829 Body mass index (BMI) 29.0-29.9, adult: Secondary | ICD-10-CM | POA: Diagnosis not present

## 2022-11-09 DIAGNOSIS — Z23 Encounter for immunization: Secondary | ICD-10-CM | POA: Diagnosis not present

## 2022-11-09 DIAGNOSIS — F430 Acute stress reaction: Secondary | ICD-10-CM | POA: Diagnosis not present

## 2022-11-09 DIAGNOSIS — K219 Gastro-esophageal reflux disease without esophagitis: Secondary | ICD-10-CM | POA: Diagnosis not present

## 2022-11-22 DIAGNOSIS — R918 Other nonspecific abnormal finding of lung field: Secondary | ICD-10-CM | POA: Diagnosis not present

## 2022-12-06 DIAGNOSIS — M25551 Pain in right hip: Secondary | ICD-10-CM | POA: Diagnosis not present

## 2023-01-02 DIAGNOSIS — M5416 Radiculopathy, lumbar region: Secondary | ICD-10-CM | POA: Diagnosis not present

## 2023-01-11 DIAGNOSIS — M5416 Radiculopathy, lumbar region: Secondary | ICD-10-CM | POA: Diagnosis not present

## 2023-01-25 ENCOUNTER — Inpatient Hospital Stay: Admission: RE | Admit: 2023-01-25 | Source: Ambulatory Visit

## 2023-01-26 ENCOUNTER — Other Ambulatory Visit: Payer: Self-pay | Admitting: Family Medicine

## 2023-01-26 DIAGNOSIS — M8580 Other specified disorders of bone density and structure, unspecified site: Secondary | ICD-10-CM

## 2023-02-09 ENCOUNTER — Ambulatory Visit: Attending: Cardiology | Admitting: Cardiology

## 2023-03-01 DIAGNOSIS — K760 Fatty (change of) liver, not elsewhere classified: Secondary | ICD-10-CM | POA: Diagnosis not present

## 2023-03-01 DIAGNOSIS — R1032 Left lower quadrant pain: Secondary | ICD-10-CM | POA: Diagnosis not present

## 2023-03-15 DIAGNOSIS — R112 Nausea with vomiting, unspecified: Secondary | ICD-10-CM | POA: Diagnosis not present

## 2023-03-15 DIAGNOSIS — R142 Eructation: Secondary | ICD-10-CM | POA: Diagnosis not present

## 2023-03-15 DIAGNOSIS — Z9889 Other specified postprocedural states: Secondary | ICD-10-CM | POA: Diagnosis not present

## 2023-03-20 DIAGNOSIS — E785 Hyperlipidemia, unspecified: Secondary | ICD-10-CM | POA: Diagnosis not present

## 2023-03-20 DIAGNOSIS — E559 Vitamin D deficiency, unspecified: Secondary | ICD-10-CM | POA: Diagnosis not present

## 2023-03-23 DIAGNOSIS — Z79899 Other long term (current) drug therapy: Secondary | ICD-10-CM | POA: Diagnosis not present

## 2023-03-23 DIAGNOSIS — R142 Eructation: Secondary | ICD-10-CM | POA: Diagnosis not present

## 2023-03-23 DIAGNOSIS — K30 Functional dyspepsia: Secondary | ICD-10-CM | POA: Diagnosis not present

## 2023-03-23 DIAGNOSIS — R112 Nausea with vomiting, unspecified: Secondary | ICD-10-CM | POA: Diagnosis not present

## 2023-03-31 DIAGNOSIS — R112 Nausea with vomiting, unspecified: Secondary | ICD-10-CM | POA: Diagnosis not present

## 2023-03-31 DIAGNOSIS — R142 Eructation: Secondary | ICD-10-CM | POA: Diagnosis not present

## 2023-04-18 DIAGNOSIS — Z1159 Encounter for screening for other viral diseases: Secondary | ICD-10-CM | POA: Diagnosis not present

## 2023-04-18 DIAGNOSIS — Z6830 Body mass index (BMI) 30.0-30.9, adult: Secondary | ICD-10-CM | POA: Diagnosis not present

## 2023-04-18 DIAGNOSIS — Z0000 Encounter for general adult medical examination without abnormal findings: Secondary | ICD-10-CM | POA: Diagnosis not present

## 2023-04-28 DIAGNOSIS — R918 Other nonspecific abnormal finding of lung field: Secondary | ICD-10-CM | POA: Diagnosis not present

## 2023-05-03 DIAGNOSIS — Z961 Presence of intraocular lens: Secondary | ICD-10-CM | POA: Diagnosis not present

## 2023-05-03 DIAGNOSIS — H40013 Open angle with borderline findings, low risk, bilateral: Secondary | ICD-10-CM | POA: Diagnosis not present

## 2023-05-03 DIAGNOSIS — H43812 Vitreous degeneration, left eye: Secondary | ICD-10-CM | POA: Diagnosis not present

## 2023-05-03 DIAGNOSIS — H04123 Dry eye syndrome of bilateral lacrimal glands: Secondary | ICD-10-CM | POA: Diagnosis not present

## 2023-05-23 DIAGNOSIS — H52222 Regular astigmatism, left eye: Secondary | ICD-10-CM | POA: Diagnosis not present

## 2023-05-23 DIAGNOSIS — H04123 Dry eye syndrome of bilateral lacrimal glands: Secondary | ICD-10-CM | POA: Diagnosis not present

## 2023-05-23 DIAGNOSIS — H40013 Open angle with borderline findings, low risk, bilateral: Secondary | ICD-10-CM | POA: Diagnosis not present

## 2023-05-23 DIAGNOSIS — H43812 Vitreous degeneration, left eye: Secondary | ICD-10-CM | POA: Diagnosis not present

## 2023-05-23 DIAGNOSIS — H5213 Myopia, bilateral: Secondary | ICD-10-CM | POA: Diagnosis not present

## 2023-05-23 DIAGNOSIS — Z961 Presence of intraocular lens: Secondary | ICD-10-CM | POA: Diagnosis not present

## 2023-05-23 DIAGNOSIS — H524 Presbyopia: Secondary | ICD-10-CM | POA: Diagnosis not present

## 2023-06-06 DIAGNOSIS — Z9889 Other specified postprocedural states: Secondary | ICD-10-CM | POA: Diagnosis not present

## 2023-06-06 DIAGNOSIS — K219 Gastro-esophageal reflux disease without esophagitis: Secondary | ICD-10-CM | POA: Diagnosis not present

## 2023-06-06 DIAGNOSIS — R110 Nausea: Secondary | ICD-10-CM | POA: Diagnosis not present

## 2023-06-06 DIAGNOSIS — K3184 Gastroparesis: Secondary | ICD-10-CM | POA: Diagnosis not present

## 2023-06-25 ENCOUNTER — Other Ambulatory Visit: Payer: Self-pay

## 2023-06-25 ENCOUNTER — Emergency Department (HOSPITAL_BASED_OUTPATIENT_CLINIC_OR_DEPARTMENT_OTHER): Admitting: Radiology

## 2023-06-25 ENCOUNTER — Encounter (HOSPITAL_BASED_OUTPATIENT_CLINIC_OR_DEPARTMENT_OTHER): Payer: Self-pay

## 2023-06-25 ENCOUNTER — Emergency Department (HOSPITAL_BASED_OUTPATIENT_CLINIC_OR_DEPARTMENT_OTHER)
Admission: EM | Admit: 2023-06-25 | Discharge: 2023-06-25 | Disposition: A | Discharge status: Home / Self Care | Attending: Emergency Medicine | Admitting: Emergency Medicine

## 2023-06-25 DIAGNOSIS — W293XXA Contact with powered garden and outdoor hand tools and machinery, initial encounter: Secondary | ICD-10-CM | POA: Insufficient documentation

## 2023-06-25 DIAGNOSIS — S61211A Laceration without foreign body of left index finger without damage to nail, initial encounter: Secondary | ICD-10-CM | POA: Insufficient documentation

## 2023-06-25 DIAGNOSIS — M7989 Other specified soft tissue disorders: Secondary | ICD-10-CM | POA: Diagnosis not present

## 2023-06-25 MED ORDER — ONDANSETRON 4 MG PO TBDP
4.0000 mg | ORAL_TABLET | Freq: Once | ORAL | Status: AC
  Administered 2023-06-25: 4 mg via ORAL
  Filled 2023-06-25: qty 1

## 2023-06-25 MED ORDER — LIDOCAINE-EPINEPHRINE-TETRACAINE (LET) TOPICAL GEL: 3.0000 mL | Freq: Once | TOPICAL | Status: DC

## 2023-06-25 MED ORDER — LIDOCAINE-EPINEPHRINE 1 %-1:100000 IJ SOLN
10.0000 mL | Freq: Once | INTRAMUSCULAR | Status: AC
  Administered 2023-06-25: 10 mL
  Filled 2023-06-25: qty 1

## 2023-06-25 MED ORDER — OXYCODONE-ACETAMINOPHEN 5-325 MG PO TABS
1.0000 | ORAL_TABLET | Freq: Once | ORAL | Status: AC
  Administered 2023-06-25: 1 via ORAL
  Filled 2023-06-25: qty 1

## 2023-06-25 NOTE — ED Triage Notes (Signed)
 Patient cut her finger on a hedge trimmer today. (Left hand index finger). No thinners. Tetanus shot done last week.

## 2023-06-25 NOTE — ED Provider Notes (Signed)
 [place] EMERGENCY DEPARTMENT AT [facility] Provider Note   CSN: [id number] Arrival date & time: 06/25/23  2036     History {Add pertinent medical, surgical, social history, OB history to HPI:1} Chief Complaint  Patient presents with   Laceration    Pam [name] is a 78 y.o. female, history of arthritis, fibromyalgia, who presents to the ED secondary to laceration, on her left index finger, that occurred when she was using some hedge cutters, trim some hedges.  She states she is not on a blood thinner, does not denies any numbness or tingling to the area.  States that she had her tetanus done last week at the pharmacy.  Denies any other complaints    Home Medications Prior to Admission medications   Medication Sig Start Date End Date Taking? Authorizing Provider  acetaminophen  (TYLENOL ) 500 MG tablet Take 1,000 mg by mouth every 6 (six) hours as needed for mild pain.    [provider]  ALPRAZolam  (XANAX ) 0.25 MG tablet TAKE 1 TABLET BY MOUTH AT BEDTIME AS NEEDED Patient taking differently: Take 0.25 mg by mouth at bedtime as needed for sleep. 09/08/16   Freddi Jaeger, NP  atorvastatin  (LIPITOR) 20 MG tablet Take 20 mg by mouth daily.    [provider]  Calcium  Carb-Cholecalciferol 600-10 MG-MCG CAPS Take 1 tablet by mouth daily.    [provider]  cetirizine (ZYRTEC) 10 MG chewable tablet Chew 10 mg by mouth daily as needed (allergies).    [provider]  Cholecalciferol (VITAMIN D3) 50 MCG (2000 UT) capsule Take 2,000 Units by mouth daily.    [provider]  FLUoxetine  (PROZAC ) 40 MG capsule Take 40 mg by mouth daily. 05/24/21   [provider]  hyoscyamine (LEVSIN) 0.125 MG tablet Take 0.125 mg by mouth every 6 (six) hours as needed for cramping. 04/10/21   [provider]  Magnesium Oxide 250 MG TABS Take 250 mg by mouth every other day.     [provider]      Allergies    Hydrocodone ,  Codeine, and Macrodantin    Review of Systems   Review of Systems  Musculoskeletal:  Negative for joint swelling.  Skin:  Positive for wound.    Physical Exam Updated Vital Signs BP 128/68 (BP Location: Right Arm)   Pulse 78   Temp 98.2 F (36.8 C) (Oral)   Resp 15   Ht 5' (1.524 m)   Wt 63.5 kg   SpO2 94%   BMI 27.34 kg/m  Physical Exam Vitals and nursing note reviewed.  Constitutional:      General: She is not in acute distress.    Appearance: She is well-developed.  HENT:     Head: Normocephalic and atraumatic.  Eyes:     General:        Right eye: No discharge.        Left eye: No discharge.     Conjunctiva/sclera: Conjunctivae normal.  Pulmonary:     Effort: No respiratory distress.  Musculoskeletal:     Comments: Left hand: TTP of distal aspect of digit 2. Radial pulses present. Grip strength intact. Able to flex, extend, ulnar and radial deviate wrist. Two point discrimination intact. Normal thumb opposition. Intact ROM for all MCPs, PIPs, and DIPs.  No snuffbox ttp. No sensory deficits. Capillary refill <2sec   Skin:    Comments: +1.5 cm laceration to distal aspect of digit 2, no nail affected  Neurological:  Mental Status: She is alert.     Comments: Clear speech.   Psychiatric:        Behavior: Behavior normal.        Thought Content: Thought content normal.     ED Results / Procedures / Treatments   Labs (all labs ordered are listed, but only abnormal results are displayed) Labs Reviewed - No data to display  EKG None  Radiology DG Finger Index Left Result Date: 06/25/2023 CLINICAL DATA:  Laceration EXAM: LEFT INDEX FINGER 2+V COMPARISON:  None Available. FINDINGS: No acute bony abnormality. Specifically, no fracture, subluxation, or dislocation. Soft tissue swelling throughout the left index finger. Lindley Stachnik amount of air noted in the soft tissues anterior to the PIP joint. No radiopaque foreign body. IMPRESSION: No fracture or radiopaque foreign  body. Electronically Signed   By: Janeece Mechanic M.D.   On: 06/25/2023 22:35    Procedures Procedures  {Document cardiac monitor, telemetry assessment procedure when appropriate:1}  Medications Ordered in ED Medications  lidocaine -EPINEPHrine  (XYLOCAINE  W/EPI) 1 %-1:100000 (with pres) injection 10 mL (10 mLs Infiltration Given 06/25/23 2216)  oxyCODONE -acetaminophen  (PERCOCET/ROXICET) 5-325 MG per tablet 1 tablet (1 tablet Oral Given 06/25/23 2215)  ondansetron  (ZOFRAN -ODT) disintegrating tablet 4 mg (4 mg Oral Given 06/25/23 2228)    ED Course/ Medical Decision Making/ A&P   {   Click here for ABCD2, HEART and other calculatorsREFRESH Note before signing :1}                              Medical Decision Making Amount and/or Complexity of Data Reviewed Radiology: ordered.  Risk Prescription drug management.   ***  {Document critical care time when appropriate:1} {Document review of labs and clinical decision tools ie heart score, Chads2Vasc2 etc:1}  {Document your independent review of radiology images, and any outside records:1} {Document your discussion with family members, caretakers, and with consultants:1} {Document social determinants of health affecting pt's care:1} {Document your decision making why or why not admission, treatments were needed:1} Final Clinical Impression(s) / ED Diagnoses Final diagnoses:  Laceration of left index finger without foreign body without damage to nail, initial encounter    Rx / DC Orders ED Discharge Orders     None

## 2023-06-25 NOTE — Discharge Instructions (Signed)
 Keep the area clean and dry, go follow-up with your primary care doctor in 10 days, for suture removal.  Your x-ray is unremarkable, if the area becomes red, swollen, or tender to the touch please return to the ER

## 2023-07-07 DIAGNOSIS — S61211D Laceration without foreign body of left index finger without damage to nail, subsequent encounter: Secondary | ICD-10-CM | POA: Diagnosis not present

## 2023-07-08 DIAGNOSIS — Z008 Encounter for other general examination: Secondary | ICD-10-CM | POA: Diagnosis not present

## 2023-07-20 DIAGNOSIS — H52222 Regular astigmatism, left eye: Secondary | ICD-10-CM | POA: Diagnosis not present

## 2023-07-20 DIAGNOSIS — H524 Presbyopia: Secondary | ICD-10-CM | POA: Diagnosis not present

## 2023-09-08 DIAGNOSIS — R1310 Dysphagia, unspecified: Secondary | ICD-10-CM | POA: Diagnosis not present

## 2023-09-08 DIAGNOSIS — K2289 Other specified disease of esophagus: Secondary | ICD-10-CM | POA: Diagnosis not present

## 2023-09-08 DIAGNOSIS — Z9889 Other specified postprocedural states: Secondary | ICD-10-CM | POA: Diagnosis not present

## 2023-09-08 DIAGNOSIS — K219 Gastro-esophageal reflux disease without esophagitis: Secondary | ICD-10-CM | POA: Diagnosis not present

## 2023-09-08 DIAGNOSIS — K449 Diaphragmatic hernia without obstruction or gangrene: Secondary | ICD-10-CM | POA: Diagnosis not present

## 2023-09-08 DIAGNOSIS — Z79899 Other long term (current) drug therapy: Secondary | ICD-10-CM | POA: Diagnosis not present

## 2023-09-08 DIAGNOSIS — K208 Other esophagitis without bleeding: Secondary | ICD-10-CM | POA: Diagnosis not present

## 2023-09-15 ENCOUNTER — Other Ambulatory Visit

## 2023-09-18 DIAGNOSIS — Z6828 Body mass index (BMI) 28.0-28.9, adult: Secondary | ICD-10-CM | POA: Diagnosis not present

## 2023-09-18 DIAGNOSIS — K219 Gastro-esophageal reflux disease without esophagitis: Secondary | ICD-10-CM | POA: Diagnosis not present

## 2023-09-18 DIAGNOSIS — Z1159 Encounter for screening for other viral diseases: Secondary | ICD-10-CM | POA: Diagnosis not present

## 2023-09-18 DIAGNOSIS — R634 Abnormal weight loss: Secondary | ICD-10-CM | POA: Diagnosis not present

## 2023-09-18 DIAGNOSIS — F411 Generalized anxiety disorder: Secondary | ICD-10-CM | POA: Diagnosis not present

## 2023-10-05 DIAGNOSIS — M5416 Radiculopathy, lumbar region: Secondary | ICD-10-CM | POA: Diagnosis not present

## 2023-10-18 DIAGNOSIS — E042 Nontoxic multinodular goiter: Secondary | ICD-10-CM | POA: Diagnosis not present

## 2023-10-18 DIAGNOSIS — R9389 Abnormal findings on diagnostic imaging of other specified body structures: Secondary | ICD-10-CM | POA: Diagnosis not present

## 2023-10-19 DIAGNOSIS — K602 Anal fissure, unspecified: Secondary | ICD-10-CM | POA: Diagnosis not present

## 2023-10-19 DIAGNOSIS — R1032 Left lower quadrant pain: Secondary | ICD-10-CM | POA: Diagnosis not present

## 2023-10-19 DIAGNOSIS — K648 Other hemorrhoids: Secondary | ICD-10-CM | POA: Diagnosis not present

## 2023-10-30 DIAGNOSIS — R300 Dysuria: Secondary | ICD-10-CM | POA: Diagnosis not present

## 2023-10-30 DIAGNOSIS — R399 Unspecified symptoms and signs involving the genitourinary system: Secondary | ICD-10-CM | POA: Diagnosis not present

## 2023-11-01 DIAGNOSIS — R0609 Other forms of dyspnea: Secondary | ICD-10-CM | POA: Diagnosis not present

## 2023-11-03 ENCOUNTER — Other Ambulatory Visit (HOSPITAL_COMMUNITY): Payer: Self-pay | Admitting: Family Medicine

## 2023-11-03 DIAGNOSIS — R0609 Other forms of dyspnea: Secondary | ICD-10-CM

## 2023-11-06 ENCOUNTER — Ambulatory Visit (HOSPITAL_COMMUNITY)
Admission: RE | Admit: 2023-11-06 | Discharge: 2023-11-06 | Disposition: A | Discharge status: Home / Self Care | Source: Ambulatory Visit | Attending: Family Medicine | Admitting: Family Medicine

## 2023-11-06 DIAGNOSIS — R0602 Shortness of breath: Secondary | ICD-10-CM | POA: Diagnosis not present

## 2023-11-06 DIAGNOSIS — R0609 Other forms of dyspnea: Secondary | ICD-10-CM | POA: Diagnosis not present

## 2023-11-06 DIAGNOSIS — R0600 Dyspnea, unspecified: Secondary | ICD-10-CM | POA: Insufficient documentation

## 2023-11-06 LAB — ECHOCARDIOGRAM COMPLETE
Area-P 1/2: 3.91 cm2
S' Lateral: 2.48 cm
Single Plane A4C EF: 61.1 %

## 2023-11-06 NOTE — Progress Notes (Signed)
 Echocardiogram 2D Echocardiogram has been performed.  Pam [name], RDCS 11/06/2023, 1:46 PM

## 2023-11-08 DIAGNOSIS — K529 Noninfective gastroenteritis and colitis, unspecified: Secondary | ICD-10-CM | POA: Diagnosis not present

## 2023-11-10 DIAGNOSIS — N3000 Acute cystitis without hematuria: Secondary | ICD-10-CM | POA: Diagnosis not present

## 2023-11-14 ENCOUNTER — Encounter (HOSPITAL_BASED_OUTPATIENT_CLINIC_OR_DEPARTMENT_OTHER): Payer: Self-pay

## 2023-11-14 ENCOUNTER — Other Ambulatory Visit (HOSPITAL_BASED_OUTPATIENT_CLINIC_OR_DEPARTMENT_OTHER)

## 2023-11-20 DIAGNOSIS — N3010 Interstitial cystitis (chronic) without hematuria: Secondary | ICD-10-CM | POA: Diagnosis not present

## 2023-11-21 DIAGNOSIS — H40013 Open angle with borderline findings, low risk, bilateral: Secondary | ICD-10-CM | POA: Diagnosis not present

## 2023-11-21 DIAGNOSIS — H5213 Myopia, bilateral: Secondary | ICD-10-CM | POA: Diagnosis not present

## 2023-11-21 DIAGNOSIS — H524 Presbyopia: Secondary | ICD-10-CM | POA: Diagnosis not present

## 2023-11-21 DIAGNOSIS — H04123 Dry eye syndrome of bilateral lacrimal glands: Secondary | ICD-10-CM | POA: Diagnosis not present

## 2023-11-21 DIAGNOSIS — H52222 Regular astigmatism, left eye: Secondary | ICD-10-CM | POA: Diagnosis not present

## 2023-11-21 DIAGNOSIS — H43812 Vitreous degeneration, left eye: Secondary | ICD-10-CM | POA: Diagnosis not present

## 2023-11-21 DIAGNOSIS — Z961 Presence of intraocular lens: Secondary | ICD-10-CM | POA: Diagnosis not present

## 2023-11-22 DIAGNOSIS — R002 Palpitations: Secondary | ICD-10-CM | POA: Diagnosis not present

## 2023-11-27 DIAGNOSIS — R399 Unspecified symptoms and signs involving the genitourinary system: Secondary | ICD-10-CM | POA: Diagnosis not present

## 2023-12-06 DIAGNOSIS — R399 Unspecified symptoms and signs involving the genitourinary system: Secondary | ICD-10-CM | POA: Diagnosis not present

## 2023-12-06 DIAGNOSIS — N3010 Interstitial cystitis (chronic) without hematuria: Secondary | ICD-10-CM | POA: Diagnosis not present

## 2024-02-28 ENCOUNTER — Encounter (HOSPITAL_COMMUNITY): Payer: Self-pay | Admitting: Pharmacy Technician

## 2024-02-28 ENCOUNTER — Emergency Department (HOSPITAL_COMMUNITY)
Admission: EM | Admit: 2024-02-28 | Discharge: 2024-02-28 | Discharge status: Against Medical Advice | Attending: Emergency Medicine | Admitting: Emergency Medicine

## 2024-02-28 ENCOUNTER — Emergency Department (HOSPITAL_COMMUNITY)

## 2024-02-28 ENCOUNTER — Other Ambulatory Visit: Payer: Self-pay

## 2024-02-28 DIAGNOSIS — Z5321 Procedure and treatment not carried out due to patient leaving prior to being seen by health care provider: Secondary | ICD-10-CM | POA: Insufficient documentation

## 2024-02-28 DIAGNOSIS — R5383 Other fatigue: Secondary | ICD-10-CM | POA: Diagnosis not present

## 2024-02-28 DIAGNOSIS — R1033 Periumbilical pain: Secondary | ICD-10-CM | POA: Diagnosis present

## 2024-02-28 DIAGNOSIS — R195 Other fecal abnormalities: Secondary | ICD-10-CM | POA: Diagnosis not present

## 2024-02-28 DIAGNOSIS — R110 Nausea: Secondary | ICD-10-CM | POA: Insufficient documentation

## 2024-02-28 DIAGNOSIS — K6289 Other specified diseases of anus and rectum: Secondary | ICD-10-CM | POA: Diagnosis not present

## 2024-02-28 LAB — COMPREHENSIVE METABOLIC PANEL WITH GFR
ALT: 21 U/L (ref 0–44)
AST: 25 U/L (ref 15–41)
Albumin: 4.2 g/dL (ref 3.5–5.0)
Alkaline Phosphatase: 110 U/L (ref 38–126)
Anion gap: 10 (ref 5–15)
BUN: 16 mg/dL (ref 8–23)
CO2: 23 mmol/L (ref 22–32)
Calcium: 9.7 mg/dL (ref 8.9–10.3)
Chloride: 106 mmol/L (ref 98–111)
Creatinine, Ser: 0.66 mg/dL (ref 0.44–1.00)
GFR, Estimated: >60 mL/min
Glucose, Bld: 102 mg/dL — ABNORMAL HIGH (ref 70–99)
Potassium: 4.3 mmol/L (ref 3.5–5.1)
Sodium: 139 mmol/L (ref 135–145)
Total Bilirubin: 0.4 mg/dL (ref 0.0–1.2)
Total Protein: 6.8 g/dL (ref 6.5–8.1)

## 2024-02-28 LAB — TYPE AND SCREEN
ABO/RH(D): B POS
Antibody Screen: NEGATIVE

## 2024-02-28 LAB — CK: Total CK: 54 U/L (ref 38–234)

## 2024-02-28 LAB — CBC
HCT: 42.7 % (ref 36.0–46.0)
Hemoglobin: 14.1 g/dL (ref 12.0–15.0)
MCH: 32.6 pg (ref 26.0–34.0)
MCHC: 33 g/dL (ref 30.0–36.0)
MCV: 98.6 fL (ref 80.0–100.0)
Platelets: 250 K/uL (ref 150–400)
RBC: 4.33 MIL/uL (ref 3.87–5.11)
RDW: 14.4 % (ref 11.5–15.5)
WBC: 4.8 K/uL (ref 4.0–10.5)
nRBC: 0 % (ref 0.0–0.2)

## 2024-02-28 MED ORDER — PANTOPRAZOLE SODIUM 40 MG IV SOLR
40.0000 mg | Freq: Once | INTRAVENOUS | Status: AC
  Administered 2024-02-28: 40 mg via INTRAVENOUS
  Filled 2024-02-28: qty 10

## 2024-02-28 MED ORDER — IOHEXOL 300 MG/ML  SOLN
100.0000 mL | Freq: Once | INTRAMUSCULAR | Status: AC | PRN
  Administered 2024-02-28: 100 mL via INTRAVENOUS

## 2024-02-28 MED ORDER — DICYCLOMINE HCL 10 MG PO CAPS
10.0000 mg | ORAL_CAPSULE | Freq: Once | ORAL | Status: AC
  Administered 2024-02-28: 10 mg via ORAL
  Filled 2024-02-28: qty 1

## 2024-02-28 NOTE — ED Triage Notes (Signed)
 Pt here with abdominal pain, fatigue, and black stool X1 week. Took pepto bismal 6 days ago.

## 2024-02-28 NOTE — ED Provider Triage Note (Signed)
 Emergency Medicine Provider Triage Evaluation Note  Pam [name] , a 79 y.o. female  was evaluated in triage.  Pt complains of of abdominal pain, dark stools.  Reports that somewhere between 6 and 10 days ago she took Pepto-Bismol.  Reports that since then she has had dark stools for approximately 2 days.  Reports that the stools have been loose, and she has also had fatigue, periumbilical pain, burning rectal pain (similar to her prior hemorrhoids).  Reports that she has had nausea without vomiting.  Patient reports that she is most concerned about the fatigue.  Reports that she called her GI doctor to discuss her symptoms earlier today and they told her to come to the emergency department for further evaluation  Review of Systems  Positive: Per above Negative: Per above  Physical Exam  BP 125/75 (BP Location: Right Arm)   Pulse 74   Temp 98.4 F (36.9 C) (Oral)   Resp 18   SpO2 98%  Gen:   Awake, no distress   Resp:  Normal effort  MSK:   Moves extremities without difficulty   Medical Decision Making  Medically screening exam initiated at 2:07 PM.  Appropriate orders placed.  ANUPAMA PIEHL was informed that the remainder of the evaluation will be completed by another provider, this initial triage assessment does not replace that evaluation, and the importance of remaining in the ED until their evaluation is complete.   Rogelia Jerilynn RAMAN, MD 02/28/24 [phone]
